# Patient Record
Sex: Female | Born: 1940 | Race: White | Hispanic: No | Smoking: Never smoker
Health system: Southern US, Community
[De-identification: ages and names within clinical notes are randomized; demographics above are authoritative.]

## PROBLEM LIST (undated history)

## (undated) DIAGNOSIS — I776 Arteritis, unspecified: Secondary | ICD-10-CM

## (undated) DIAGNOSIS — I77810 Thoracic aortic ectasia: Secondary | ICD-10-CM

## (undated) DIAGNOSIS — E785 Hyperlipidemia, unspecified: Secondary | ICD-10-CM

## (undated) DIAGNOSIS — I4819 Other persistent atrial fibrillation: Secondary | ICD-10-CM

## (undated) DIAGNOSIS — I10 Essential (primary) hypertension: Secondary | ICD-10-CM

## (undated) DIAGNOSIS — I5032 Chronic diastolic (congestive) heart failure: Secondary | ICD-10-CM

## (undated) DIAGNOSIS — M3130 Wegener's granulomatosis without renal involvement: Secondary | ICD-10-CM

## (undated) DIAGNOSIS — E039 Hypothyroidism, unspecified: Secondary | ICD-10-CM

## (undated) DIAGNOSIS — I639 Cerebral infarction, unspecified: Secondary | ICD-10-CM

## (undated) HISTORY — PX: NASAL SINUS SURGERY: SHX719

## (undated) HISTORY — DX: Cerebral infarction, unspecified: I63.9

## (undated) HISTORY — DX: Thoracic aortic ectasia: I77.810

## (undated) HISTORY — DX: Arteritis, unspecified: I77.6

## (undated) HISTORY — PX: LUNG SURGERY: SHX703

## (undated) HISTORY — DX: Essential (primary) hypertension: I10

## (undated) HISTORY — DX: Chronic diastolic (congestive) heart failure: I50.32

## (undated) HISTORY — DX: Other persistent atrial fibrillation: I48.19

## (undated) HISTORY — DX: Hyperlipidemia, unspecified: E78.5

## (undated) HISTORY — DX: Wegener's granulomatosis without renal involvement: M31.30

## (undated) HISTORY — DX: Hypothyroidism, unspecified: E03.9

## (undated) HISTORY — PX: BUNIONECTOMY: SHX129

## (undated) HISTORY — PX: CHOLECYSTECTOMY: SHX55

---

## 1977-11-19 HISTORY — PX: ABDOMINAL HYSTERECTOMY: SHX81

## 1998-10-07 ENCOUNTER — Other Ambulatory Visit: Admission: RE | Admit: 1998-10-07 | Discharge: 1998-10-07 | Payer: Self-pay | Admitting: Obstetrics and Gynecology

## 1999-01-10 ENCOUNTER — Encounter: Payer: Self-pay | Admitting: Neurosurgery

## 1999-01-10 ENCOUNTER — Ambulatory Visit (HOSPITAL_COMMUNITY): Admission: RE | Admit: 1999-01-10 | Discharge: 1999-01-10 | Payer: Self-pay | Admitting: Neurosurgery

## 1999-01-19 ENCOUNTER — Encounter: Payer: Self-pay | Admitting: Neurosurgery

## 1999-01-19 ENCOUNTER — Ambulatory Visit (HOSPITAL_COMMUNITY): Admission: RE | Admit: 1999-01-19 | Discharge: 1999-01-19 | Payer: Self-pay | Admitting: Neurosurgery

## 1999-01-20 ENCOUNTER — Encounter: Payer: Self-pay | Admitting: Neurosurgery

## 1999-01-20 ENCOUNTER — Ambulatory Visit (HOSPITAL_COMMUNITY): Admission: RE | Admit: 1999-01-20 | Discharge: 1999-01-20 | Payer: Self-pay | Admitting: Neurosurgery

## 1999-01-25 ENCOUNTER — Encounter: Payer: Self-pay | Admitting: Thoracic Surgery

## 1999-01-26 ENCOUNTER — Encounter: Payer: Self-pay | Admitting: Thoracic Surgery

## 1999-01-26 ENCOUNTER — Inpatient Hospital Stay (HOSPITAL_COMMUNITY): Admission: RE | Admit: 1999-01-26 | Discharge: 1999-01-31 | Payer: Self-pay | Admitting: Thoracic Surgery

## 1999-01-27 ENCOUNTER — Encounter: Payer: Self-pay | Admitting: Thoracic Surgery

## 1999-01-28 ENCOUNTER — Encounter: Payer: Self-pay | Admitting: Thoracic Surgery

## 1999-01-29 ENCOUNTER — Encounter: Payer: Self-pay | Admitting: Thoracic Surgery

## 1999-08-30 ENCOUNTER — Encounter: Admission: RE | Admit: 1999-08-30 | Discharge: 1999-08-30 | Payer: Self-pay | Admitting: Thoracic Surgery

## 1999-09-04 ENCOUNTER — Encounter: Admission: RE | Admit: 1999-09-04 | Discharge: 1999-09-04 | Payer: Self-pay | Admitting: Thoracic Surgery

## 1999-09-07 ENCOUNTER — Encounter (INDEPENDENT_AMBULATORY_CARE_PROVIDER_SITE_OTHER): Payer: Self-pay | Admitting: Specialist

## 1999-09-07 ENCOUNTER — Ambulatory Visit (HOSPITAL_COMMUNITY): Admission: RE | Admit: 1999-09-07 | Discharge: 1999-09-07 | Payer: Self-pay | Admitting: Critical Care Medicine

## 1999-09-07 ENCOUNTER — Encounter: Payer: Self-pay | Admitting: Critical Care Medicine

## 1999-09-21 ENCOUNTER — Ambulatory Visit: Admission: RE | Admit: 1999-09-21 | Discharge: 1999-09-21 | Payer: Self-pay | Admitting: Critical Care Medicine

## 1999-10-23 ENCOUNTER — Other Ambulatory Visit: Admission: RE | Admit: 1999-10-23 | Discharge: 1999-10-23 | Payer: Self-pay | Admitting: Obstetrics and Gynecology

## 1999-11-14 ENCOUNTER — Encounter: Admission: RE | Admit: 1999-11-14 | Discharge: 1999-11-14 | Payer: Self-pay | Admitting: Obstetrics and Gynecology

## 1999-11-14 ENCOUNTER — Encounter: Payer: Self-pay | Admitting: Obstetrics and Gynecology

## 2000-09-26 ENCOUNTER — Encounter: Payer: Self-pay | Admitting: Obstetrics and Gynecology

## 2000-09-26 ENCOUNTER — Encounter: Admission: RE | Admit: 2000-09-26 | Discharge: 2000-09-26 | Payer: Self-pay | Admitting: Obstetrics and Gynecology

## 2000-10-24 ENCOUNTER — Other Ambulatory Visit: Admission: RE | Admit: 2000-10-24 | Discharge: 2000-10-24 | Payer: Self-pay | Admitting: Obstetrics and Gynecology

## 2001-10-02 ENCOUNTER — Encounter: Payer: Self-pay | Admitting: Obstetrics and Gynecology

## 2001-10-02 ENCOUNTER — Encounter: Admission: RE | Admit: 2001-10-02 | Discharge: 2001-10-02 | Payer: Self-pay | Admitting: Obstetrics and Gynecology

## 2001-12-30 ENCOUNTER — Other Ambulatory Visit: Admission: RE | Admit: 2001-12-30 | Discharge: 2001-12-30 | Payer: Self-pay | Admitting: Obstetrics and Gynecology

## 2002-10-26 ENCOUNTER — Encounter: Payer: Self-pay | Admitting: Obstetrics and Gynecology

## 2002-10-26 ENCOUNTER — Encounter: Admission: RE | Admit: 2002-10-26 | Discharge: 2002-10-26 | Payer: Self-pay | Admitting: Obstetrics and Gynecology

## 2002-11-02 ENCOUNTER — Encounter (INDEPENDENT_AMBULATORY_CARE_PROVIDER_SITE_OTHER): Payer: Self-pay | Admitting: *Deleted

## 2002-11-02 ENCOUNTER — Ambulatory Visit (HOSPITAL_COMMUNITY): Admission: RE | Admit: 2002-11-02 | Discharge: 2002-11-02 | Payer: Self-pay | Admitting: Gastroenterology

## 2003-09-27 ENCOUNTER — Encounter: Admission: RE | Admit: 2003-09-27 | Discharge: 2003-10-19 | Payer: Self-pay | Admitting: Internal Medicine

## 2003-11-08 ENCOUNTER — Encounter: Admission: RE | Admit: 2003-11-08 | Discharge: 2003-11-08 | Payer: Self-pay | Admitting: Obstetrics and Gynecology

## 2004-12-08 ENCOUNTER — Encounter: Admission: RE | Admit: 2004-12-08 | Discharge: 2004-12-08 | Payer: Self-pay | Admitting: Obstetrics and Gynecology

## 2007-01-03 ENCOUNTER — Encounter: Admission: RE | Admit: 2007-01-03 | Discharge: 2007-01-03 | Payer: Self-pay | Admitting: Family Medicine

## 2007-10-27 ENCOUNTER — Encounter: Admission: RE | Admit: 2007-10-27 | Discharge: 2007-10-27 | Payer: Self-pay | Admitting: Family Medicine

## 2007-11-20 ENCOUNTER — Emergency Department (HOSPITAL_COMMUNITY): Admission: EM | Admit: 2007-11-20 | Discharge: 2007-11-20 | Payer: Self-pay | Admitting: Emergency Medicine

## 2009-03-30 ENCOUNTER — Encounter: Admission: RE | Admit: 2009-03-30 | Discharge: 2009-03-30 | Payer: Self-pay | Admitting: Family Medicine

## 2010-04-03 ENCOUNTER — Encounter: Admission: RE | Admit: 2010-04-03 | Discharge: 2010-04-03 | Payer: Self-pay | Admitting: Family Medicine

## 2010-12-10 ENCOUNTER — Encounter: Payer: Self-pay | Admitting: Family Medicine

## 2011-01-26 ENCOUNTER — Other Ambulatory Visit: Payer: Self-pay | Admitting: Dermatology

## 2011-04-06 NOTE — Op Note (Signed)
   NAME:  Carolyn, Vazquez                        ACCOUNT NO.:  [id number]   MEDICAL RECORD NO.:  [id number]                   PATIENT TYPE:  AMB   LOCATION:  ENDO                                 FACILITY:  [facility]   PHYSICIAN:  Petra Kuba, M.D.                 DATE OF BIRTH:  December 06, 1940   DATE OF PROCEDURE:  DATE OF DISCHARGE:                                 OPERATIVE REPORT   PROCEDURE:  Colonoscopy with biopsy.   INDICATIONS:  Family history of colon cancer, due for repeat screening.   Consent was signed after risks, benefits, methods and options were  thoroughly discussed in the office.   MEDICINES USED:  Demerol 100, Versed 5.   PROCEDURE:  Rectal inspection is pertinent for external hemorrhoids, small  digital exam was negative.  The video pediatric adjustable colonoscope was  inserted with minimal difficulty which required left-side pressure was able  to be advanced to the cecum.  Other than some left-sided diverticula, no  other abnormalities were seen on insertion.  Cecum was identified by the  appendiceal orifice and the ileocecal valve.  The scope was slowly  withdrawn.  The prep was adequate.  There was some liquid stool that  required washing and suctioning.  On slow withdrawal through the colon,  cecum, ascending and transverse were normal.  There were some left-sided  diverticular seen and in the distal sigmoid a tiny probable hyperplastic  appearing polyp was seen which was cold biopsied times two.  The scope was  slowly withdrawn back to the rectum and retroflexed, pertinent for some  internal hemorrhoids.  The scope was straightened, re-advanced a good ways  up the left side of the colon.  Air was suctioned, the scope was removed.  The patient tolerated the procedure well.  There was no obvious immediate  complication.   ENDOSCOPIC DIAGNOSES:  1. Internal and external hemorrhoids.  2. Left-sided diverticula.  3. Tiny sigmoid polyp cold biopsied.  4.  Otherwise, within normal limits to the cecum.    PLAN:  Yearly rectals and guaiacs per Dr. Julian Reil.  Happy to see back  p.r.n.; otherwise, await pathology.  Probably will re-check colon screening  in five years if doing well medically.                                               Petra Kuba, M.D.    MEM/MEDQ  D:  11/02/2002  T:  11/02/2002  Job:  [id number]   cc:   Schuyler Amor, M.D.  [address]  [place], [place] [address]  Fax: [phone]   Dr. Leo Grosser

## 2011-05-08 ENCOUNTER — Other Ambulatory Visit: Payer: Self-pay | Admitting: Family Medicine

## 2011-05-08 DIAGNOSIS — Z1231 Encounter for screening mammogram for malignant neoplasm of breast: Secondary | ICD-10-CM

## 2011-07-10 ENCOUNTER — Other Ambulatory Visit: Payer: Self-pay | Admitting: Dermatology

## 2011-07-31 ENCOUNTER — Ambulatory Visit
Admission: RE | Admit: 2011-07-31 | Discharge: 2011-07-31 | Disposition: A | Discharge status: Home / Self Care | Payer: Medicare Other | Source: Ambulatory Visit | Attending: Family Medicine | Admitting: Family Medicine

## 2011-07-31 DIAGNOSIS — Z1231 Encounter for screening mammogram for malignant neoplasm of breast: Secondary | ICD-10-CM

## 2011-08-09 LAB — URINE MICROSCOPIC-ADD ON

## 2011-08-09 LAB — I-STAT 8, (EC8 V) (CONVERTED LAB)
Acid-Base Excess: 3 — ABNORMAL HIGH
BUN: 7
Bicarbonate: 26 — ABNORMAL HIGH
Chloride: 108
Glucose, Bld: 117 — ABNORMAL HIGH
HCT: 50 — ABNORMAL HIGH
Operator id: 285491
Sodium: 138
TCO2: 27
pH, Ven: 7.495 — ABNORMAL HIGH

## 2011-08-09 LAB — DIFFERENTIAL
Eosinophils Absolute: 0.1
Eosinophils Relative: 1
Lymphocytes Relative: 10 — ABNORMAL LOW
Monocytes Absolute: 1.6 — ABNORMAL HIGH
Monocytes Relative: 8
Neutro Abs: 15.7 — ABNORMAL HIGH
Neutrophils Relative %: 81 — ABNORMAL HIGH

## 2011-08-09 LAB — POCT I-STAT CREATININE: Operator id: 285491

## 2011-08-09 LAB — URINALYSIS, ROUTINE W REFLEX MICROSCOPIC
Protein, ur: 100 — AB
Specific Gravity, Urine: 1.022
pH: 7

## 2011-08-09 LAB — CBC
HCT: 45.8
MCHC: 34.1
Platelets: 429 — ABNORMAL HIGH
RBC: 5.04
WBC: 19.4 — ABNORMAL HIGH

## 2012-05-07 ENCOUNTER — Other Ambulatory Visit: Payer: Self-pay | Admitting: Gastroenterology

## 2012-07-16 ENCOUNTER — Other Ambulatory Visit: Payer: Self-pay | Admitting: Family Medicine

## 2012-07-16 DIAGNOSIS — Z1231 Encounter for screening mammogram for malignant neoplasm of breast: Secondary | ICD-10-CM

## 2012-07-30 ENCOUNTER — Other Ambulatory Visit: Payer: Self-pay | Admitting: Dermatology

## 2012-08-15 ENCOUNTER — Ambulatory Visit
Admission: RE | Admit: 2012-08-15 | Discharge: 2012-08-15 | Disposition: A | Discharge status: Home / Self Care | Payer: Medicare Other | Source: Ambulatory Visit | Attending: Family Medicine | Admitting: Family Medicine

## 2012-08-15 DIAGNOSIS — Z1231 Encounter for screening mammogram for malignant neoplasm of breast: Secondary | ICD-10-CM

## 2012-11-21 ENCOUNTER — Other Ambulatory Visit: Payer: Self-pay | Admitting: Dermatology

## 2013-01-12 ENCOUNTER — Other Ambulatory Visit: Payer: Self-pay | Admitting: Family Medicine

## 2013-01-12 DIAGNOSIS — M542 Cervicalgia: Secondary | ICD-10-CM

## 2013-01-18 ENCOUNTER — Ambulatory Visit
Admission: RE | Admit: 2013-01-18 | Discharge: 2013-01-18 | Disposition: A | Discharge status: Home / Self Care | Payer: Medicare Other | Source: Ambulatory Visit | Attending: Family Medicine | Admitting: Family Medicine

## 2013-01-18 DIAGNOSIS — M542 Cervicalgia: Secondary | ICD-10-CM

## 2013-06-11 DIAGNOSIS — J309 Allergic rhinitis, unspecified: Secondary | ICD-10-CM | POA: Insufficient documentation

## 2013-06-11 DIAGNOSIS — I776 Arteritis, unspecified: Secondary | ICD-10-CM | POA: Insufficient documentation

## 2014-06-07 ENCOUNTER — Ambulatory Visit
Admission: RE | Admit: 2014-06-07 | Discharge: 2014-06-07 | Disposition: A | Discharge status: Home / Self Care | Payer: Medicare Other | Source: Ambulatory Visit | Attending: Family Medicine | Admitting: Family Medicine

## 2014-06-07 ENCOUNTER — Other Ambulatory Visit: Payer: Self-pay | Admitting: Family Medicine

## 2014-06-07 DIAGNOSIS — M3130 Wegener's granulomatosis without renal involvement: Secondary | ICD-10-CM

## 2014-08-12 DIAGNOSIS — J329 Chronic sinusitis, unspecified: Secondary | ICD-10-CM | POA: Insufficient documentation

## 2014-09-15 ENCOUNTER — Other Ambulatory Visit: Payer: Self-pay

## 2014-09-15 DIAGNOSIS — Z1231 Encounter for screening mammogram for malignant neoplasm of breast: Secondary | ICD-10-CM

## 2014-10-12 ENCOUNTER — Ambulatory Visit
Admission: RE | Admit: 2014-10-12 | Discharge: 2014-10-12 | Disposition: A | Discharge status: Home / Self Care | Payer: Medicare Other | Source: Ambulatory Visit

## 2014-10-12 DIAGNOSIS — Z1231 Encounter for screening mammogram for malignant neoplasm of breast: Secondary | ICD-10-CM

## 2014-12-15 ENCOUNTER — Ambulatory Visit (INDEPENDENT_AMBULATORY_CARE_PROVIDER_SITE_OTHER): Payer: Medicare Other | Admitting: Cardiology

## 2014-12-15 ENCOUNTER — Encounter: Payer: Self-pay | Admitting: Cardiology

## 2014-12-15 VITALS — BP 140/100 | HR 89 | Ht 61.5 in | Wt 156.4 lb

## 2014-12-15 DIAGNOSIS — R0600 Dyspnea, unspecified: Secondary | ICD-10-CM | POA: Insufficient documentation

## 2014-12-15 DIAGNOSIS — M3130 Wegener's granulomatosis without renal involvement: Secondary | ICD-10-CM | POA: Insufficient documentation

## 2014-12-15 DIAGNOSIS — R0789 Other chest pain: Secondary | ICD-10-CM | POA: Insufficient documentation

## 2014-12-15 DIAGNOSIS — R0602 Shortness of breath: Secondary | ICD-10-CM

## 2014-12-15 DIAGNOSIS — R0609 Other forms of dyspnea: Secondary | ICD-10-CM

## 2014-12-15 DIAGNOSIS — I776 Arteritis, unspecified: Secondary | ICD-10-CM

## 2014-12-15 NOTE — Progress Notes (Signed)
Cardiology Office Note   Date:  12/15/2014   ID:  Carolyn, Vazquez March 03, 1941, MRN [id number]  PCP:  Lilian Coma, MD  Cardiologist:   Darlin Coco, MD   No chief complaint on file.     History of Present Illness: Carolyn Vazquez is a 74 y.o. female who presents for evaluation of chest tightness and exertional dyspnea.  She is a medical patient of Dr. Stephanie Acre.  She does not have any known cardiac disease.  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  It initially presented as an abnormal chest x-ray and she underwent biopsy which yielded the diagnosis of Wegener's granulomatosis.  Since 2000 she has been followed by the pulmonary department at [facility].  In 2003 she had some left upper chest discomfort and had another biopsy which again revealed Wegener's ranula mitosis.  She has been treated over the years with immunosuppressive therapy.  At the present time she is not on any active therapy.  Last spring the patient had a terrible cough and was treated by Dr. Cheron Schaumann with antibiotics and was also seen at [facility].  She gradually improved.  Her most recent chest x-ray of 06/07/2014 showed no active pulmonary disease and showed hyperinflation of the lungs.  Heart size was normal. Recently the patient has been experiencing some chest heaviness off and early in the morning.  She has noted exertional dyspnea climbing stairs and walking uphill.  She has had a past history of peripheral edema which resolves after she was placed on losartan HCT by her physicians at [facility].  She reports that in October 2015 she had pulmonary function studies at [facility] and subsequently was given a trial of steroid inhaler for several weeks but she did not notice any improvement in her symptoms and therefore stopped the inhaler.  Her last nuclear stress test was in 2008 at [place] History    Diagnosis Date  . Thyroid disease   . Hyperlipidemia   . Chronic kidney disease     Past Surgical History  Procedure Laterality Date  . Lung surgery  2000's    both vasculitis  . Abdominal hysterectomy  1979  . Nasal sinus surgery    . Cholecystectomy       Current Outpatient Prescriptions  Medication Sig Dispense Refill  . acetaminophen (TYLENOL) 325 MG tablet Take 325 mg by mouth daily as needed. Mild pain    . aspirin 81 MG tablet Take 81 mg by mouth daily.    . B Complex Vitamins (B-COMPLEX/B-12) TABS Take by mouth daily.    . Cholecalciferol (VITAMIN D3) 1000 UNITS CAPS Take 2,000 mg by mouth daily.    . fexofenadine (ALLEGRA) 180 MG tablet Take 180 mg by mouth daily.    [name] levothyroxine (SYNTHROID, LEVOTHROID) 88 MCG tablet Take 88 mcg by mouth daily.    [name] losartan-hydrochlorothiazide (HYZAAR) 50-12.5 MG per tablet Take by mouth daily.    . Naproxen Sodium 220 MG CAPS Take 220 mg by mouth daily as needed. headache     No current facility-administered medications for this visit.    Allergies:   Codeine; Penicillins; and Simvastatin    Social History:  The patient  reports that she has never smoked. She does not have any smokeless tobacco history on file. She reports that she drinks alcohol. She reports that she does not use illicit drugs.  Family History:  The patient's family history includes Alcoholism in her father; Asthma in her brother and sister; Diabetes in her brother; Heart attack in her brother; Stroke in her father and mother; Thyroid disease in her mother, sister, and sister.    ROS:  Please see the history of present illness.   Otherwise, review of systems are positive for mild essential tremor.   All other systems are reviewed and negative.    PHYSICAL EXAM: VS:  BP 140/100 mmHg  Pulse 89  Ht 5' 1.5" (1.562 m)  Wt 156 lb 6.4 oz (70.943 kg)  BMI 29.08 kg/m2 , BMI Body mass index is 29.08 kg/(m^2). GEN: Well nourished, well developed, in no acute  distress HEENT: normal Neck: no JVD, carotid bruits, or masses Cardiac: RRR; no murmurs, rubs, or gallops,no edema  Respiratory:  clear to auscultation bilaterally, normal work of breathing GI: soft, nontender, nondistended, + BS MS: no deformity or atrophy Skin: warm and dry, no rash Neuro:  Strength and sensation are intact Psych: euthymic mood, full affect   EKG:  EKG is ordered today. The ekg ordered today demonstrates normal sinus rhythm with peaked P waves in 2,3 and aVF suggesting right atrial enlargement.  No ischemic changes at rest.   Recent Labs: No results found for requested labs within last 365 days.    Lipid Panel No results found for: CHOL, TRIG, HDL, CHOLHDL, VLDL, LDLCALC, LDLDIRECT    Wt Readings from Last 3 Encounters:  12/15/14 156 lb 6.4 oz (70.943 kg)      Other studies Reviewed: Additional studies/ records that were reviewed today include:     ASSESSMENT AND PLAN:  1.  Dyspnea on exertion 2.  Chest pressure and heaviness 3.  Essential hypertension 4.  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  The patient does not have concerns regarding medicines.  The following changes have been made:  no change  Labs/ tests ordered today include:   Orders Placed This Encounter  Procedures  . Myocardial Perfusion Imaging  . EKG 12-Lead  . 2D Echocardiogram without contrast     Disposition:  We will have the patient return for a 2-D echocardiogram to evaluate her left ventricular and right ventricular function and pulmonary artery pressure.  Her EKG suggests possible elevated right-sided pressures.  We will also have her return for a walking Lexiscan Myoview stress test to evaluate her complaint of chest heaviness. No new medications were prescribed today. Many thanks for the opportunity to see this pleasant woman and we will be in touch  regarding the results of her studies   Signed, Darlin Coco, MD  12/15/2014 5:13 PM    [facility] [place], [place], [place]  [address] Phone: [phone]; Fax: [phone]

## 2014-12-15 NOTE — Patient Instructions (Addendum)
Your physician has requested that you have an echocardiogram. Echocardiography is a painless test that uses sound waves to create images of your heart. It provides your doctor with information about the size and shape of your heart and how well your heart's chambers and valves are working. This procedure takes approximately one hour. There are no restrictions for this procedure.  Your physician has requested that you have a lexiscan myoview. For further information please visit [email or web address]. Please follow instruction sheet, as given.  Your physician recommends that you continue on your current medications as directed. Please refer to the Current Medication list given to you today.  Follow up as needed

## 2014-12-24 ENCOUNTER — Ambulatory Visit (HOSPITAL_COMMUNITY): Payer: Medicare Other | Attending: Cardiology

## 2014-12-24 DIAGNOSIS — R0602 Shortness of breath: Secondary | ICD-10-CM | POA: Insufficient documentation

## 2014-12-24 DIAGNOSIS — R0789 Other chest pain: Secondary | ICD-10-CM

## 2014-12-24 DIAGNOSIS — A5002 Early congenital syphilitic osteochondropathy: Secondary | ICD-10-CM | POA: Diagnosis not present

## 2014-12-24 NOTE — Progress Notes (Signed)
2D Echo completed. 12/24/2014

## 2014-12-30 ENCOUNTER — Ambulatory Visit (HOSPITAL_COMMUNITY): Payer: Medicare Other | Attending: Cardiology | Admitting: Radiology

## 2014-12-30 DIAGNOSIS — R0789 Other chest pain: Secondary | ICD-10-CM | POA: Diagnosis not present

## 2014-12-30 DIAGNOSIS — R0602 Shortness of breath: Secondary | ICD-10-CM | POA: Diagnosis not present

## 2014-12-30 MED ORDER — REGADENOSON 0.4 MG/5ML IV SOLN
0.4000 mg | Freq: Once | INTRAVENOUS | Status: AC
  Administered 2014-12-30: 0.4 mg via INTRAVENOUS

## 2014-12-30 MED ORDER — TECHNETIUM TC 99M SESTAMIBI GENERIC - CARDIOLITE
11.0000 | Freq: Once | INTRAVENOUS | Status: AC | PRN
  Administered 2014-12-30: 11 via INTRAVENOUS

## 2014-12-30 MED ORDER — TECHNETIUM TC 99M SESTAMIBI GENERIC - CARDIOLITE
33.0000 | Freq: Once | INTRAVENOUS | Status: AC | PRN
  Administered 2014-12-30: 33 via INTRAVENOUS

## 2014-12-30 NOTE — Progress Notes (Signed)
[name] [address] [place], [place] [address] [phone]    Cardiology Nuclear Med Study  Carolyn Vazquez is a 74 y.o. female     MRN : [id number]     DOB: [birth year]  Procedure Date: 12/30/2014  Nuclear Med Background Indication for Stress Test:  Evaluation for Ischemia History:  MPI 2008 (normal)  Cardiac Risk Factors: Hypertension  Symptoms:  Chest Pain and DOE   Nuclear Pre-Procedure Caffeine/Decaff Intake:  None NPO After: 9:00pm   Lungs:  clear O2 Sat: 97% on room air. IV 0.9% NS with Angio Cath:  22g  IV Site: R Forearm  IV Started by:  Crissie Figures, RN  Chest Size (in):  36 Cup Size: C  Height: 5' 1.5" (1.562 m)  Weight:  152 lb (68.947 kg)  BMI:  Body mass index is 28.26 kg/(m^2). Tech Comments:  N/A    Nuclear Med Study 1 or 2 day study: 1 day  Stress Test Type:  Lexiscan  Reading MD: N/A  Order Authorizing Provider:  Darlin Coco, MD  Resting Radionuclide: Technetium 41m Sestamibi  Resting Radionuclide Dose: 11.0 mCi   Stress Radionuclide:  Technetium 20m Sestamibi  Stress Radionuclide Dose: 33.0 mCi           Stress Protocol Rest HR: 69 Stress HR: 110  Rest BP: 143/85 Stress BP: 132/76  Exercise Time (min): n/a METS: n/a   Predicted Max HR: 146 bpm % Max HR: 75.34 bpm Rate Pressure Product: 15840   Dose of Adenosine (mg):  n/a Dose of Lexiscan: 0.4 mg  Dose of Atropine (mg): n/a Dose of Dobutamine: n/a mcg/kg/min (at max HR)  Stress Test Technologist: Glade Lloyd, BS-ES  Nuclear Technologist:  Earl Many, CNMT     Rest Procedure:  Myocardial perfusion imaging was performed at rest 45 minutes following the intravenous administration of Technetium 25m Sestamibi. Rest ECG: NSR with non-specific ST-T wave changes  Stress Procedure:  The patient received IV Lexiscan 0.4 mg over 15-seconds.  Technetium 45m Sestamibi injected at 30-seconds.  Quantitative spect images were obtained after a 45 minute delay.   During the infusion of Lexiscan the patient complained of SOB that resolved in recovery.  Stress ECG: No significant change from baseline ECG  QPS Raw Data Images:  Normal; no motion artifact; normal heart/lung ratio. Stress Images:  Normal homogeneous uptake in all areas of the myocardium. Rest Images:  Normal homogeneous uptake in all areas of the myocardium. Subtraction (SDS):  Normal Transient Ischemic Dilatation (Normal <1.22):  0.81 Lung/Heart Ratio (Normal <0.45):  0.27  Quantitative Gated Spect Images QGS EDV:  61 ml QGS ESV:  19 ml  Impression Exercise Capacity:  Lexiscan with no exercise. BP Response:  Normal blood pressure response. Clinical Symptoms:  There is dyspnea. ECG Impression:  No significant ST segment change suggestive of ischemia. Comparison with Prior Nuclear Study: No images to compare  Overall Impression:  Normal stress nuclear study.  LV Ejection Fraction: 68%.  LV Wall Motion:  NL LV Function; NL Wall Motion  [place]

## 2015-01-03 ENCOUNTER — Telehealth: Payer: Self-pay | Admitting: Cardiology

## 2015-01-03 ENCOUNTER — Telehealth: Payer: Self-pay | Admitting: *Deleted

## 2015-01-03 NOTE — Telephone Encounter (Signed)
-----   Message from Darlin Coco, MD sent at 12/27/2014  8:58 AM EST ----- Please report.  The echocardiogram shows good left ventricular systolic function.  There is diastolic dysfunction which may be contributing to her exertional dyspnea.  The right ventricular size is normal.  Await results of her nuclear stress test.Send a copy to Dr. Stephanie Acre.

## 2015-01-03 NOTE — Telephone Encounter (Signed)
Advised patient and scheduled follow up appointment with  Dr. Mare Ferrari for Wednesday morning

## 2015-01-03 NOTE — Telephone Encounter (Signed)
-----   Message from Darlin Coco, MD sent at 12/31/2014  9:18 AM EST ----- Please report.  The nuclear stress test was normal.  No evidence of ischemia.  The ejection fraction was normal at 68%.  Please send results to PCP

## 2015-01-03 NOTE — Telephone Encounter (Signed)
New message ° ° ° ° ° °Calling to get stress test results °

## 2015-01-05 ENCOUNTER — Encounter: Payer: Self-pay | Admitting: Cardiology

## 2015-01-05 ENCOUNTER — Ambulatory Visit (INDEPENDENT_AMBULATORY_CARE_PROVIDER_SITE_OTHER): Payer: Medicare Other | Admitting: Cardiology

## 2015-01-05 VITALS — BP 110/82 | HR 74 | Ht 61.5 in | Wt 154.0 lb

## 2015-01-05 DIAGNOSIS — I776 Arteritis, unspecified: Secondary | ICD-10-CM

## 2015-01-05 DIAGNOSIS — R0609 Other forms of dyspnea: Secondary | ICD-10-CM

## 2015-01-05 DIAGNOSIS — R0600 Dyspnea, unspecified: Secondary | ICD-10-CM

## 2015-01-05 DIAGNOSIS — I5189 Other ill-defined heart diseases: Secondary | ICD-10-CM

## 2015-01-05 DIAGNOSIS — R0789 Other chest pain: Secondary | ICD-10-CM

## 2015-01-05 DIAGNOSIS — M3130 Wegener's granulomatosis without renal involvement: Secondary | ICD-10-CM

## 2015-01-05 DIAGNOSIS — I519 Heart disease, unspecified: Secondary | ICD-10-CM

## 2015-01-05 NOTE — Progress Notes (Signed)
Cardiology Office Note   Date:  01/05/2015   ID:  Carolyn, Vazquez December 15, 1940, MRN [id number]  PCP:  Lilian Coma, MD  Cardiologist:   Darlin Coco, MD   No chief complaint on file.     History of Present Illness: Carolyn Vazquez is a 74 y.o. female who presents for follow-up office visit.  Carolyn Vazquez is a 74 y.o. female who presents for follow-up appointment of chest tightness and exertional dyspnea. She is a medical patient of Dr. Stephanie Acre. She does not have any known cardiac disease. 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 It initially presented as an abnormal chest x-ray and she underwent biopsy which yielded the diagnosis of Wegener's granulomatosis. Since 2000 she has been followed by the pulmonary department at [facility]. In 2003 she had some left upper chest discomfort and had another biopsy which again revealed Wegener's ranula mitosis. She has been treated over the years with immunosuppressive therapy. At the present time she is not on any active therapy. Last spring the patient had a terrible cough and was treated by Dr. Cheron Schaumann with antibiotics and was also seen at [facility]. She gradually improved. Her most recent chest x-ray of 06/07/2014 showed no active pulmonary disease and showed hyperinflation of the lungs. Heart size was normal. Recently the patient had been experiencing some chest heaviness off and early in the morning. She has noted exertional dyspnea climbing stairs and walking uphill. She has had a past history of peripheral edema which resolves after she was placed on losartan HCT by her physicians at [facility]. She reports that in October 2015 she had pulmonary function studies at [facility] and subsequently was given a trial of steroid inhaler for several weeks but she did not notice any improvement in her symptoms and therefore stopped the inhaler.  Her last nuclear stress test was in 2008 at [facility]. The patient had an echocardiogram on 12/24/14.The echocardiogram shows good left ventricular systolic function. There is diastolic dysfunction which may be contributing to her exertional dyspnea. The right ventricular size is normal. The patient had a Lexiscan Myoview stress test on 12/31/14. The nuclear stress test was normal. No evidence of ischemia. The ejection fraction was normal at 68%.  Past Medical History  Diagnosis Date  . Thyroid disease   . Hyperlipidemia   . Chronic kidney disease     Past Surgical History  Procedure Laterality Date  . Lung surgery  2000's    both vasculitis  . Abdominal hysterectomy  1979  . Nasal sinus surgery    . Cholecystectomy       Current Outpatient Prescriptions  Medication Sig Dispense Refill  . acetaminophen (TYLENOL) 325 MG tablet Take 325 mg by mouth daily as needed. Mild pain    . aspirin 81 MG tablet Take 81 mg by mouth daily.    . B Complex Vitamins (B-COMPLEX/B-12) TABS Take by mouth daily.    . Cholecalciferol (VITAMIN D3) 1000 UNITS CAPS Take 2,000 mg by mouth daily.    . fexofenadine (ALLEGRA) 180 MG tablet Take 180 mg by mouth daily.    [name] levothyroxine (SYNTHROID, LEVOTHROID) 88 MCG tablet Take 88 mcg by mouth daily.    [name] losartan-hydrochlorothiazide (HYZAAR) 50-12.5 MG per tablet Take by mouth daily.    . Naproxen Sodium 220 MG CAPS Take 220 mg by mouth daily as needed. headache    . Omeprazole Magnesium (  PRILOSEC OTC PO) Take 2 tablets by mouth daily.    . Probiotic Product (PROBIOTIC DAILY PO) Take 1 tablet by mouth daily.     No current facility-administered medications for this visit.    Allergies:   Codeine; Penicillins; and Simvastatin    Social History:  The patient  reports that she has never smoked. She does not have any smokeless tobacco history on file. She reports that she drinks alcohol. She reports that she does not use illicit drugs.   Family History:   The patient's family history includes Alcoholism in her father; Asthma in her brother and sister; Diabetes in her brother; Heart attack in her brother; Stroke in her father and mother; Thyroid disease in her mother, sister, and sister.    ROS:  Please see the history of present illness.   Otherwise, review of systems are positive for none.   All other systems are reviewed and negative.    PHYSICAL EXAM: VS:  BP 110/82 mmHg  Pulse 74  Ht 5' 1.5" (1.562 m)  Wt 154 lb (69.854 kg)  BMI 28.63 kg/m2 , BMI Body mass index is 28.63 kg/(m^2). GEN: Well nourished, well developed, in no acute distress HEENT: normal Neck: no JVD, carotid bruits, or masses Cardiac: RRR; no murmurs, rubs, or gallops,no edema  Respiratory:  clear to auscultation bilaterally, normal work of breathing GI: soft, nontender, nondistended, + BS MS: no deformity or atrophy Skin: warm and dry, no rash Neuro:  Strength and sensation are intact Psych: euthymic mood, full affect   EKG:  EKG is not ordered today.    Recent Labs: No results found for requested labs within last 365 days.    Lipid Panel No results found for: CHOL, TRIG, HDL, CHOLHDL, VLDL, LDLCALC, LDLDIRECT    Wt Readings from Last 3 Encounters:  01/05/15 154 lb (69.854 kg)  12/30/14 152 lb (68.947 kg)  12/15/14 156 lb 6.4 oz (70.943 kg)      Other studies Reviewed:    ASSESSMENT AND PLAN:  1. Dyspnea on exertion 2. Chest pressure and heaviness 3. Essential hypertension 4. 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  The patient does not have concerns regarding medicines. We discussed the results of her echocardiogram and her nuclear stress test today.  We discussed that she does have some diastolic dysfunction which may be contributing to her exertional dyspnea.  Considered adding a when necessary dose of furosemide on the  however her blood pressure is soft and she might not tolerate much additional diuresis.  She states that the mild peripheral edema present in her ankles at the end of the day really does not bother her much.  We did not add any new medication today.  Consider low-dose furosemide intermittently going forward if necessary.  Continue to watch dietary salt intake.  Recheck in 6 months for follow-up office visit.  The following changes have been made:  no change  Labs/ tests ordered today include: None  No orders of the defined types were placed in this encounter.     Disposition:   FU with Dr. Mare Ferrari in 6 months for follow-up office visit   Signed, Darlin Coco, MD  01/05/2015 10:17 AM    [facility] [place], [place], [place]  [address] Phone: [phone]; Fax: [phone]

## 2015-01-05 NOTE — Patient Instructions (Signed)
Your physician recommends that you continue on your current medications as directed. Please refer to the Current Medication list given to you today.  Your physician wants you to follow-up in: 6 month ov You will receive a reminder letter in the mail two months in advance. If you don't receive a letter, please call our office to schedule the follow-up appointment.  

## 2015-10-03 ENCOUNTER — Encounter: Payer: Self-pay | Admitting: Cardiology

## 2015-10-03 ENCOUNTER — Telehealth: Payer: Self-pay | Admitting: Cardiology

## 2015-10-03 NOTE — Telephone Encounter (Signed)
error 

## 2015-10-20 ENCOUNTER — Ambulatory Visit: Payer: Medicare Other | Admitting: Cardiology

## 2015-10-21 ENCOUNTER — Telehealth: Payer: Self-pay | Admitting: Cardiology

## 2015-10-21 NOTE — Telephone Encounter (Signed)
New Message  Pt calling to speak w/ RN concerning possiblility of a BP monitor. Please call back and discuss.

## 2015-10-21 NOTE — Telephone Encounter (Signed)
Spoke with patient and advised nothing received from PCP regarding an at home ambulatory blood pressure monitor  Per patient she had been having issues with her blood pressure and discussed with PCP  Requested that she call them Monday and ask them to resend, stated she would

## 2015-10-21 NOTE — Telephone Encounter (Signed)
New message   Pt calling to speak w/ RN concerning possiblility of a BP monitor. Please call back and discuss.

## 2015-10-21 NOTE — Telephone Encounter (Signed)
Left message to call back  

## 2015-10-31 ENCOUNTER — Encounter: Payer: Self-pay | Admitting: *Deleted

## 2015-10-31 ENCOUNTER — Ambulatory Visit (INDEPENDENT_AMBULATORY_CARE_PROVIDER_SITE_OTHER): Payer: Medicare Other

## 2015-10-31 DIAGNOSIS — I10 Essential (primary) hypertension: Secondary | ICD-10-CM | POA: Diagnosis not present

## 2015-10-31 NOTE — Progress Notes (Signed)
Patient ID: Carolyn Vazquez, female   DOB: [birth year], 74 y.o.   MRN: [id number] 24 hour ambulatory blood pressure monitor applied to patient.  Dr. Mare Ferrari to read. Fax results to ordering physician, Dr. [name], [facility] at [place], [place] # [phone].

## 2015-11-02 NOTE — Telephone Encounter (Signed)
Monitor results received pending  Dr. Sherryl Barters review

## 2015-11-03 NOTE — Telephone Encounter (Signed)
Put on Shelly's desk to send to referring MD

## 2015-11-09 ENCOUNTER — Telehealth: Payer: Self-pay | Admitting: Cardiology

## 2015-11-09 NOTE — Telephone Encounter (Signed)
New message      Pt is calling to get bp monitor results.  Please call and tell her something, she is very, very anxious.

## 2015-11-09 NOTE — Telephone Encounter (Signed)
I spoke with [name] and results are being faxed to primary care this morning.  I spoke with Carolyn Vazquez and gave her this information.

## 2015-12-15 ENCOUNTER — Ambulatory Visit (INDEPENDENT_AMBULATORY_CARE_PROVIDER_SITE_OTHER): Payer: Medicare Other | Admitting: Cardiology

## 2015-12-15 ENCOUNTER — Encounter: Payer: Self-pay | Admitting: Cardiology

## 2015-12-15 VITALS — BP 136/84 | HR 64 | Ht 61.5 in | Wt 150.1 lb

## 2015-12-15 DIAGNOSIS — I519 Heart disease, unspecified: Secondary | ICD-10-CM

## 2015-12-15 DIAGNOSIS — R0609 Other forms of dyspnea: Secondary | ICD-10-CM | POA: Diagnosis not present

## 2015-12-15 DIAGNOSIS — I5189 Other ill-defined heart diseases: Secondary | ICD-10-CM

## 2015-12-15 DIAGNOSIS — R0600 Dyspnea, unspecified: Secondary | ICD-10-CM

## 2015-12-15 NOTE — Patient Instructions (Addendum)
Medication Instructions:  Your physician recommends that you continue on your current medications as directed. Please refer to the Current Medication list given to you today.  Labwork: None ordered  Testing/Procedures: None ordered  Follow-Up: You have been referred to establish cardiology care with Dr. Meda Coffee in 6 months.  If you need a refill on your cardiac medications before your next appointment, please call your pharmacy.  Thank you for choosing [facility]!!

## 2015-12-15 NOTE — Progress Notes (Signed)
Cardiology Office Note   Date:  12/15/2015   ID:  Carolyn Vazquez, Carolyn Vazquez 06/08/1941, MRN [id number]  PCP:  Lilian Coma, MD  Cardiologist: Darlin Coco MD  Chief Complaint  Patient presents with  . Follow-up    essential hypertension.C.o of sob. Denies chest pain, le edema, claudication      History of Present Illness: Carolyn Vazquez is a 75 y.o. female who presents for a six-month follow-up visit   Carolyn Vazquez is a 75 y.o. female who presents for follow-up appointment of chest tightness and exertional dyspnea. She is a medical patient of Dr. Stephanie Acre. She does not have any known cardiac disease. 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 It initially presented as an abnormal chest x-ray and she underwent biopsy which yielded the diagnosis of Wegener's granulomatosis. Since 2000 she has been followed by the pulmonary department at [facility]. In 2003 she had some left upper chest discomfort and had another biopsy which again revealed Wegener's granulomatosis.. She has been treated over the years with immunosuppressive therapy. At the present time she is not on any active therapy. Last spring the patient had a terrible cough and was treated by Dr. Cheron Schaumann with antibiotics and was also seen at [facility]. She gradually improved. A recent chest x-ray of 06/07/2014 showed no active pulmonary disease and showed hyperinflation of the lungs. Heart size was normal. . She has had a past history of peripheral edema which resolves after she was placed on losartan HCT by her physicians at [facility]. She reports that in October 2015 she had pulmonary function studies at [facility] and subsequently was given a trial of steroid inhaler for several weeks but she did not notice any improvement in her symptoms and therefore stopped the inhaler. Her last nuclear stress test was in 2008 at [facility]. The patient had an echocardiogram on 12/24/14.The echocardiogram shows good left ventricular systolic function. There is diastolic dysfunction which may be contributing to her exertional dyspnea. The right ventricular size is normal. The patient had a Lexiscan Myoview stress test on 12/31/14. The nuclear stress test was normal. No evidence of ischemia. The ejection fraction was normal at 68%.  Since last visit she has been feeling better.  Her dyspnea has improved.  She has lost 4 pounds.  She now has a dog which she walks on a regular basis for exercise.  She has had no recent chest discomfort.  No dizziness or syncope.  No palpitations.  Past Medical History  Diagnosis Date  . Thyroid disease   . Hyperlipidemia   . Chronic kidney disease     Past Surgical History  Procedure Laterality Date  . Lung surgery  2000's    both vasculitis  . Abdominal hysterectomy  1979  . Nasal sinus surgery    . Cholecystectomy       Current Outpatient Prescriptions  Medication Sig Dispense Refill  . acetaminophen (TYLENOL) 325 MG tablet Take 325 mg by mouth daily as needed. Mild pain    . amLODipine (NORVASC) 5 MG tablet Take 5 mg by mouth every evening.    [name] aspirin 81 MG tablet Take 81 mg by mouth daily.    . B Complex Vitamins (B-COMPLEX/B-12) TABS Take 1 tablet by mouth daily.     . Cholecalciferol (VITAMIN D3) 1000 UNITS CAPS Take 2,000 mg by mouth daily.    . fexofenadine (ALLEGRA) 180 MG  tablet Take 180 mg by mouth daily.    [name] levothyroxine (SYNTHROID, LEVOTHROID) 88 MCG tablet Take 88 mcg by mouth daily.    [name] losartan (COZAAR) 100 MG tablet Take 100 mg by mouth every morning.    . Naproxen Sodium 220 MG CAPS Take 220 mg by mouth daily as needed. headache    . Omeprazole Magnesium (PRILOSEC OTC PO) Take 2 tablets by mouth daily.    . Probiotic Product (PROBIOTIC DAILY PO) Take 1 tablet by mouth daily.     No current facility-administered medications for this visit.    Allergies:    Codeine; Penicillins; and Simvastatin    Social History:  The patient  reports that she has never smoked. She does not have any smokeless tobacco history on file. She reports that she drinks alcohol. She reports that she does not use illicit drugs.   Family History:  The patient's family history includes Alcoholism in her father; Asthma in her brother and sister; Diabetes in her brother; Heart attack in her brother; Stroke in her father and mother; Thyroid disease in her mother, sister, and sister.    ROS:  Please see the history of present illness.   Otherwise, review of systems are positive for none.   All other systems are reviewed and negative.    PHYSICAL EXAM: VS:  BP 136/84 mmHg  Pulse 64  Ht 5' 1.5" (1.562 m)  Wt 150 lb 1.9 oz (68.094 kg)  BMI 27.91 kg/m2 , BMI Body mass index is 27.91 kg/(m^2). GEN: Well nourished, well developed, in no acute distress HEENT: normal Neck: no JVD, carotid bruits, or masses Cardiac: RRR; no murmurs, rubs, or gallops,no edema  Respiratory:  clear to auscultation bilaterally, normal work of breathing GI: soft, nontender, nondistended, + BS MS: no deformity or atrophy Skin: warm and dry, no rash Neuro:  Strength and sensation are intact Psych: euthymic mood, full affect   EKG:  EKG is ordered today. The ekg ordered today demonstrates normal sinus rhythm with short PR interval.   Recent Labs: No results found for requested labs within last 365 days.    Lipid Panel No results found for: CHOL, TRIG, HDL, CHOLHDL, VLDL, LDLCALC, LDLDIRECT    Wt Readings from Last 3 Encounters:  12/15/15 150 lb 1.9 oz (68.094 kg)  01/05/15 154 lb (69.854 kg)  12/30/14 152 lb (68.947 kg)         ASSESSMENT AND PLAN:  1. Dyspnea on exertion, improved 2. Chest pressure and heaviness, improved.  Stress test normal 3. Essential hypertension.  In December 2016 she wore a 24 hour blood pressure monitor which showed satisfactory blood pressure  control. 4. 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  The patient does not have concerns regarding medicines.  The following changes have been made:  no change  Labs/ tests ordered today include:   Orders Placed This Encounter  Procedures  . EKG 12-Lead     Disposition:   Continue same medication.  Recheck in 6 months by Dr. Meda Coffee  Signed, Darlin Coco MD 12/15/2015 1:20 PM    [address], [place], [place]  [address] Phone: [phone]; Fax: [phone]

## 2016-08-09 ENCOUNTER — Encounter: Payer: Self-pay | Admitting: Cardiology

## 2016-08-24 ENCOUNTER — Encounter: Payer: Self-pay | Admitting: Cardiology

## 2016-08-24 ENCOUNTER — Ambulatory Visit (INDEPENDENT_AMBULATORY_CARE_PROVIDER_SITE_OTHER): Payer: Medicare Other | Admitting: Cardiology

## 2016-08-24 VITALS — BP 120/64 | HR 67 | Ht 61.5 in | Wt 150.0 lb

## 2016-08-24 DIAGNOSIS — I10 Essential (primary) hypertension: Secondary | ICD-10-CM

## 2016-08-24 DIAGNOSIS — I5033 Acute on chronic diastolic (congestive) heart failure: Secondary | ICD-10-CM

## 2016-08-24 DIAGNOSIS — R0600 Dyspnea, unspecified: Secondary | ICD-10-CM

## 2016-08-24 DIAGNOSIS — M3130 Wegener's granulomatosis without renal involvement: Secondary | ICD-10-CM

## 2016-08-24 DIAGNOSIS — I776 Arteritis, unspecified: Secondary | ICD-10-CM

## 2016-08-24 DIAGNOSIS — R0609 Other forms of dyspnea: Secondary | ICD-10-CM | POA: Diagnosis not present

## 2016-08-24 MED ORDER — AMLODIPINE BESYLATE 2.5 MG PO TABS: 2.5000 mg | ORAL_TABLET | Freq: Every day | ORAL | 6 refills | Status: DC

## 2016-08-24 MED ORDER — FUROSEMIDE 20 MG PO TABS: 20.0000 mg | ORAL_TABLET | Freq: Every day | ORAL | 6 refills | Status: DC

## 2016-08-24 NOTE — Patient Instructions (Signed)
Medication Instructions:   DECREASE YOUR AMLODIPINE TO 2.5 MG ONCE DAILY  START TAKING LASIX 20 MG ONCE DAILY    Follow-Up:  Your physician wants you to follow-up in: [place] will receive a reminder letter in the mail two months in advance. If you don't receive a letter, please call our office to schedule the follow-up appointment.       If you need a refill on your cardiac medications before your next appointment, please call your pharmacy.

## 2016-08-24 NOTE — Progress Notes (Signed)
Cardiology Office Note    Date:  08/24/2016   ID:  Carolyn, Vazquez 1941/04/01, MRN [id number]  PCP:  Lilian Coma, MD  Cardiologist:   Ena Dawley, MD   Reason for the visit: 6 months follow up.  History of Present Illness:  Carolyn Vazquez is a 75 y.o. female was previously followed by Dr. Mare Ferrari, after she had an episode of chest tightness and exertional dyspnea. She is a medical patient of Dr. Stephanie Acre. She does not have any known cardiac disease. 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 It initially presented as an abnormal chest x-ray and she underwent biopsy which yielded the diagnosis of Wegener's granulomatosis. Since 2000 she has been followed by the pulmonary department at [facility]. In 2003 she had some left upper chest discomfort and had another biopsy which again revealed Wegener's granulomatosis.. She has been treated over the years with immunosuppressive therapy. At the present time she is not on any active therapy. Last spring the patient had a terrible cough and was treated by Dr. Cheron Schaumann with antibiotics and was also seen at [facility]. She gradually improved. A recent chest x-ray of 06/07/2014 showed no active pulmonary disease and showed hyperinflation of the lungs. Heart size was normal. She has had a past history of peripheral edema which resolves after she was placed on losartan HCT by her physicians at [facility]. She reports that in October 2015 she had pulmonary function studies at [facility] and subsequently was given a trial of steroid inhaler for several weeks but she did not notice any improvement in her symptoms and therefore stopped the inhaler. Her last nuclear stress test was in 2008 at [facility]. The patient had an echocardiogram on 12/24/14.The echocardiogram shows good left ventricular systolic function. There is grade 2 diastolic dysfunction which  may be contributing to her exertional dyspnea. The right ventricular size is normal. RV has normal function. The patient had a Lexiscan Myoview stress test on 12/31/14. The nuclear stress test was normal. No evidence of ischemia. The ejection fraction was normal at 68%.   08/24/2016 - the patient states that she has been doing okay, denies any chest pain, she continues to have stable dyspnea on exertion, however she continues to walk with her dog about 3/4 miles everyday. She feels most short of breath at the beginning of exercise but it eases up as she continues. She denies any orthopnea or proximal nocturnal dyspnea. She has dependent lower extremity edema towards the end of the day.   Past Medical History:  Diagnosis Date  . Chronic kidney disease   . Hyperlipidemia   . Thyroid disease    Past Surgical History:  Procedure Laterality Date  . ABDOMINAL HYSTERECTOMY  1979  . CHOLECYSTECTOMY    . 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 NASAL SINUS SURGERY     Current Medications: Outpatient Medications Prior to Visit  Medication Sig Dispense Refill  . acetaminophen (TYLENOL) 325 MG tablet Take 325 mg by mouth daily as needed. Mild pain    . aspirin 81 MG tablet Take 81 mg by mouth daily.    . B Complex Vitamins (B-COMPLEX/B-12) TABS Take 1 tablet by mouth daily.     . Cholecalciferol (VITAMIN D3) 1000 UNITS CAPS Take 2,000 mg by mouth daily.    . fexofenadine (ALLEGRA) 180 MG tablet Take 180 mg by mouth daily.    [name]  levothyroxine (SYNTHROID, LEVOTHROID) 88 MCG tablet Take 88 mcg by mouth daily.    [name] losartan (COZAAR) 100 MG tablet Take 100 mg by mouth every morning.    . Naproxen Sodium 220 MG CAPS Take 220 mg by mouth daily as needed. headache    . Probiotic Product (PROBIOTIC DAILY PO) Take 1 tablet by mouth daily.    [name] amLODipine (NORVASC) 5 MG tablet Take 5 mg by mouth every evening.    . Omeprazole Magnesium (PRILOSEC OTC PO) Take 2 tablets by mouth daily.     No  facility-administered medications prior to visit.      Allergies:   Codeine; Atorvastatin; Penicillins; and Simvastatin   Social History   Social History  . Marital status: Widowed    Spouse name: N/A  . Number of children: N/A  . Years of education: N/A   Social History Main Topics  . Smoking status: Never Smoker  . Smokeless tobacco: Never Used  . Alcohol use 0.0 oz/week     Comment: every day glass of wine  . Drug use: No  . Sexual activity: Not Asked   Other Topics Concern  . None   Social History Narrative  . None    Family History:  The patient's family history includes Alcoholism in her father; Asthma in her brother and sister; Diabetes in her brother; Heart attack in her brother; Stroke in her father and mother; Thyroid disease in her mother, sister, and sister.   ROS:   Please see the history of present illness.    ROS All other systems reviewed and are negative.   PHYSICAL EXAM:   VS:  BP 120/64   Pulse 67   Ht 5' 1.5" (1.562 m)   Wt 150 lb (68 kg)   BMI 27.88 kg/m    GEN: Well nourished, well developed, in no acute distress  HEENT: normal  Neck: no JVD, carotid bruits, or masses Cardiac: RRR; no murmurs, rubs, or gallops,no edema  Respiratory:  clear to auscultation bilaterally, normal work of breathing GI: soft, nontender, nondistended, + BS MS: no deformity or atrophy  Skin: warm and dry, no rash Neuro:  Alert and Oriented x 3, Strength and sensation are intact Psych: euthymic mood, full affect  Wt Readings from Last 3 Encounters:  08/24/16 150 lb (68 kg)  12/15/15 150 lb 1.9 oz (68.1 kg)  01/05/15 154 lb (69.9 kg)    Studies/Labs Reviewed:   EKG:  EKG is ordered today.  The ekg ordered today demonstrates Normal sinus rhythm with possible prior anterior MI, that is unchanged from Junior 26 2017.  Recent Labs: No results found for requested labs within last 8760 hours.   Lipid Panel No results found for: CHOL, TRIG, HDL, CHOLHDL, VLDL,  LDLCALC, LDLDIRECT  TTE: 12/2014  - Left ventricle: The cavity size was normal. Wall thickness was normal. Systolic function was normal. The estimated ejection fraction was in the range of 55% to 60%. Wall motion was normal; there were no regional wall motion abnormalities. Features are consistent with a pseudonormal left ventricular filling pattern, with concomitant abnormal relaxation and increased filling pressure (grade 2 diastolic dysfunction). - Aortic valve: There was no stenosis. - Aorta: Aortic root dimension: 36 mm (ED). - Aortic root: The aortic root was borderline dilated. - Mitral valve: Mildly calcified annulus. Mildly calcified leaflets . There was no significant regurgitation. - Left atrium: The atrium was moderately dilated. - Right ventricle: The cavity size was normal. Systolic function was normal. -  Pulmonary arteries: No complete TR doppler jet so unable to estimate PA systolic pressure. - Inferior vena cava: The vessel was normal in size. The respirophasic diameter changes were in the normal range (= 50%), consistent with normal central venous pressure.  Impressions: - Normal LV size and systolic function, EF 0000000. Moderate diastolic dysfunction. Normal RV size and systolic function. Moderate left atrial enlargement. No significant valvular abnormalities.   ASSESSMENT:    1. Hypertension, essential   2. Acute on chronic diastolic CHF (congestive heart failure), NYHA class 2 ([place])   3. Dyspnea on exertion   4. Wegener's granulomatosis with vasculitis ([place])   5. Essential hypertension     PLAN:  In order of problems listed above:  She is stable dyspnea on exertion however worsening lower extremity edema towards the end of today. I will decrease amlodipine dose from 5 mg to 2.5 mg daily. I will start Lasix 20 mg daily. She is supposed to follow with her primary care physician to next months he will check her labs and blood  pressure.   Medication Adjustments/Labs and Tests Ordered: Current medicines are reviewed at length with the patient today.  Concerns regarding medicines are outlined above.  Medication changes, Labs and Tests ordered today are listed in the Patient Instructions below. Patient Instructions  Medication Instructions:   DECREASE YOUR AMLODIPINE TO 2.5 MG ONCE DAILY  START TAKING LASIX 20 MG ONCE DAILY    Follow-Up:  Your physician wants you to follow-up in: [place] will receive a reminder letter in the mail two months in advance. If you don't receive a letter, please call our office to schedule the follow-up appointment.       If you need a refill on your cardiac medications before your next appointment, please call your pharmacy.      Signed, Ena Dawley, MD  08/24/2016 3:40 PM    [facility] [place], [place], [place]  [address] Phone: [phone]; Fax: [phone]

## 2016-09-19 ENCOUNTER — Encounter: Payer: Medicare Other | Admitting: Cardiology

## 2016-10-29 ENCOUNTER — Telehealth: Payer: Self-pay | Admitting: Cardiology

## 2016-10-29 NOTE — Telephone Encounter (Signed)
Left a message for the pt to call back.  

## 2016-10-29 NOTE — Telephone Encounter (Signed)
New message:   Please call,very concerned about the diagnosis that was on on a letter she received from her insurance company. It stated that she he had heart failure,she had never been told this before. Pt is very upset by this.

## 2016-10-30 ENCOUNTER — Telehealth: Payer: Self-pay | Admitting: Cardiology

## 2016-10-30 NOTE — Telephone Encounter (Signed)
New message ° °Pt is returning call  ° °Please call back °

## 2016-10-30 NOTE — Telephone Encounter (Signed)
She has grade 2 diastolic dysfunction and lower extremity edema that is presentation of heart failure in the setting of diastolic dysfunction.

## 2016-10-30 NOTE — Telephone Encounter (Signed)
Notified the pt that per Dr Meda Coffee she does have grade 2 diastolic dysfunction and lower extremity edema, which  is presentation of heart failure in the setting of diastolic dysfunction.  Per the pt, [employer] saw this diagnosis and they sent her a letter in the mail wanting her to enroll in their heart failure program.   Pt is very upset with this letter.  Informed the pt that she can call [facility] and opt to be taken off their list for Heart Failure program and follow-up.  Informed the pt that she has the right to do this, for this is a service they offer for free.   Advised the pt to call them in the near future, to avoid continuous letters being sent to her to enroll in this program.   Pt verbalized understanding and agrees with this plan.  Pt gracious for explaining her care more in detail.

## 2016-10-30 NOTE — Telephone Encounter (Signed)
Nuala Alpha, LPN at [id number] PM   Status: Sign at exiting of workspace    Notified the pt that per Dr Meda Coffee she does have grade 2 diastolic dysfunction and lower extremity edema, which  is presentation of heart failure in the setting of diastolic dysfunction.  Per the pt, [employer] saw this diagnosis and they sent her a letter in the mail wanting her to enroll in their heart failure program.   Pt is very upset with this letter.  Informed the pt that she can call [facility] and opt to be taken off their list for Heart Failure program and follow-up.  Informed the pt that she has the right to do this, for this is a service they offer for free.   Advised the pt to call them in the near future, to avoid continuous letters being sent to her to enroll in this program.   Pt verbalized understanding and agrees with this plan.  Pt gracious for explaining her care more in detail.    [name], MD at 10/30/2016 12:35 PM   Status: Signed    She has grade 2 diastolic dysfunction and lower extremity edema that is presentation of heart failure in the setting of diastolic dysfunction.    Nuala Alpha, LPN at [id number] AM   Status: Signed    Pt is calling in regards a diagnosis she was given by our office for CHF.  Pt states she received a letter from her insurance company about this, and states she was never informed that she had heart failure.  Informed the pt that she has a diagnosis of diastolic dysfunction, and provided pt education about this diagnosis.  Informed the pt that I will route this concern to Dr Meda Coffee to elaborate on, and follow-up with the pt thereafter.

## 2016-10-30 NOTE — Telephone Encounter (Signed)
Carolyn Alpha, LPN at [id number] [id number] PM   Status: Sign at exiting of workspace    Notified the pt that per Dr Meda Coffee she does have grade 2 diastolic dysfunction and lower extremity edema, which  is presentation of heart failure in the setting of diastolic dysfunction.  Per the pt, [employer] saw this diagnosis and they sent her a letter in the mail wanting her to enroll in their heart failure program.   Pt is very upset with this letter.  Informed the pt that she can call [facility] and opt to be taken off their list for Heart Failure program and follow-up.  Informed the pt that she has the right to do this, for this is a service they offer for free.   Advised the pt to call them in the near future, to avoid continuous letters being sent to her to enroll in this program.   Pt verbalized understanding and agrees with this plan.  Pt gracious for explaining her care more in detail.    [name], MD at 10/30/2016 12:35 PM   Status: Signed    She has grade 2 diastolic dysfunction and lower extremity edema that is presentation of heart failure in the setting of diastolic dysfunction.    Carolyn Alpha, LPN at [id number] [id number] AM   Status: Signed    Pt is calling in regards a diagnosis she was given by our office for CHF.  Pt states she received a letter from her insurance company about this, and states she was never informed that she had heart failure.  Informed the pt that she has a diagnosis of diastolic dysfunction, and provided pt education about this diagnosis.  Informed the pt that I will route this concern to Dr Meda Coffee to elaborate on, and follow-up with the pt thereafter.

## 2016-10-30 NOTE — Telephone Encounter (Signed)
New message  Pt is returning call  Please call back after 3 pm

## 2016-10-30 NOTE — Telephone Encounter (Signed)
Pt is calling in regards a diagnosis she was given by our office for CHF.  Pt states she received a letter from her insurance company about this, and states she was never informed that she had heart failure.  Informed the pt that she has a diagnosis of diastolic dysfunction, and provided pt education about this diagnosis.  Informed the pt that I will route this concern to Dr Meda Coffee to elaborate on, and follow-up with the pt thereafter.

## 2016-12-20 ENCOUNTER — Telehealth: Payer: Self-pay | Admitting: *Deleted

## 2016-12-20 MED ORDER — LEVOTHYROXINE SODIUM 100 MCG PO TABS: 100.0000 ug | ORAL_TABLET | Freq: Every day | ORAL | Status: DC

## 2016-12-20 NOTE — Telephone Encounter (Signed)
Spoke with the pt to inform her that per Dr Meda Coffee, she reviewed her scanned labs from her PCP and notes that she has Elevated TSH, and recommends that her Synthroid needs to be adjusted to 100 mcg po daily.  Also informed the pt that per Dr Meda Coffee, she also had elevated LDL, but normal previously, and she recommends that she improve her diet and we can recheck at the time of her appointment in March. Per the pt, her PCP at [facility] already adjusted her TSH to 100 mcg po daily, and is rechecking the pts lipids, TSH, and a CMET in 6 weeks.  Per the pt, she will have her PCP office fax the results to Dr Meda Coffee for her review, once results are complete.  Pt verbalized understanding and will plan on seeing Dr Meda Coffee for follow-up on 3/29.

## 2016-12-20 NOTE — Telephone Encounter (Signed)
-----   Message from [name], MD sent at 12/20/2016  4:59 PM EST ----- Elevated TSH, her Synthroid needs to be adjusted to 100 mcg, also elevated LDL, but normal previously, she should improve her diet and we can recheck at the time of her appointment in March.

## 2017-02-05 ENCOUNTER — Encounter: Payer: Self-pay | Admitting: Cardiology

## 2017-02-14 ENCOUNTER — Ambulatory Visit (INDEPENDENT_AMBULATORY_CARE_PROVIDER_SITE_OTHER): Payer: Medicare Other | Admitting: Cardiology

## 2017-02-14 ENCOUNTER — Encounter (INDEPENDENT_AMBULATORY_CARE_PROVIDER_SITE_OTHER): Payer: Self-pay

## 2017-02-14 VITALS — BP 120/74 | HR 75 | Ht 61.5 in | Wt 148.0 lb

## 2017-02-14 DIAGNOSIS — I10 Essential (primary) hypertension: Secondary | ICD-10-CM | POA: Diagnosis not present

## 2017-02-14 DIAGNOSIS — I5033 Acute on chronic diastolic (congestive) heart failure: Secondary | ICD-10-CM

## 2017-02-14 DIAGNOSIS — I519 Heart disease, unspecified: Secondary | ICD-10-CM

## 2017-02-14 DIAGNOSIS — I5189 Other ill-defined heart diseases: Secondary | ICD-10-CM

## 2017-02-14 NOTE — Progress Notes (Signed)
Cardiology Office Note    Date:  02/17/2017   ID:  Carolyn, Vazquez [birth year], MRN [id number]  PCP:  Lilian Coma, MD  Cardiologist:   Ena Dawley, MD   Reason for the visit: 6 months follow up.  History of Present Illness:  Carolyn Vazquez is a 76 y.o. female was previously followed by Dr. Mare Ferrari, after she had an episode of chest tightness and exertional dyspnea. She is a medical patient of Dr. Stephanie Acre. She does not have any known cardiac disease. 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 It initially presented as an abnormal chest x-ray and she underwent biopsy which yielded the diagnosis of Wegener's granulomatosis. Since 2000 she has been followed by the pulmonary department at [facility]. In 2003 she had some left upper chest discomfort and had another biopsy which again revealed Wegener's granulomatosis.. She has been treated over the years with immunosuppressive therapy. At the present time she is not on any active therapy. Last spring the patient had a terrible cough and was treated by Dr. Cheron Schaumann with antibiotics and was also seen at [facility]. She gradually improved. A recent chest x-ray of 06/07/2014 showed no active pulmonary disease and showed hyperinflation of the lungs. Heart size was normal. She has had a past history of peripheral edema which resolves after she was placed on losartan HCT by her physicians at [facility]. She reports that in October 2015 she had pulmonary function studies at [facility] and subsequently was given a trial of steroid inhaler for several weeks but she did not notice any improvement in her symptoms and therefore stopped the inhaler. Her last nuclear stress test was in 2008 at [facility]. The patient had an echocardiogram on 12/24/14.The echocardiogram shows good left ventricular systolic function. There is grade 2 diastolic dysfunction which  may be contributing to her exertional dyspnea. The right ventricular size is normal. RV has normal function. The patient had a Lexiscan Myoview stress test on 12/31/14. The nuclear stress test was normal. No evidence of ischemia. The ejection fraction was normal at 68%.   08/24/2016 - the patient states that she has been doing okay, denies any chest pain, she continues to have stable dyspnea on exertion, however she continues to walk with her dog about 3/4 miles everyday. She feels most short of breath at the beginning of exercise but it eases up as she continues. She denies any orthopnea or proximal nocturnal dyspnea. She has dependent lower extremity edema towards the end of the day.  02/14/17 - 6 months follow up the patient denies any chest pain, SOB, no LE edema, no orthopnea, PND. She has been compliant with her meds and has no side effects.   Past Medical History:  Diagnosis Date  . Chronic kidney disease   . Hyperlipidemia   . Thyroid disease    Past Surgical History:  Procedure Laterality Date  . ABDOMINAL HYSTERECTOMY  1979  . CHOLECYSTECTOMY    . 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 NASAL SINUS SURGERY     Current Medications: Outpatient Medications Prior to Visit  Medication Sig Dispense Refill  . acetaminophen (TYLENOL) 325 MG tablet Take 325 mg by mouth daily as needed. Mild pain    . amLODipine (NORVASC) 2.5 MG tablet Take 1 tablet (2.5 mg total) by mouth daily. 90 tablet 6  . aspirin 81 MG tablet Take 81 mg by mouth daily.    [name]  B Complex Vitamins (B-COMPLEX/B-12) TABS Take 1 tablet by mouth daily.     . Calcium Carbonate Antacid (TUMS PO) Take 1 tablet by mouth daily.    . Cholecalciferol (VITAMIN D3) 1000 UNITS CAPS Take 2,000 mg by mouth daily.    . fexofenadine (ALLEGRA) 180 MG tablet Take 180 mg by mouth daily.    . furosemide (LASIX) 20 MG tablet Take 1 tablet (20 mg total) by mouth daily. 90 tablet 6  . levothyroxine (SYNTHROID) 100 MCG tablet Take 1  tablet (100 mcg total) by mouth daily before breakfast.    . losartan (COZAAR) 100 MG tablet Take 100 mg by mouth every morning.    . Multiple Vitamins-Minerals (ICAPS) CAPS Take 1 tablet by mouth 2 (two) times daily.    . Naproxen Sodium 220 MG CAPS Take 220 mg by mouth daily as needed. headache    . Probiotic Product (PROBIOTIC DAILY PO) Take 1 tablet by mouth daily.     No facility-administered medications prior to visit.      Allergies:   Codeine; Atorvastatin; Penicillins; and Simvastatin   Social History   Social History  . Marital status: Widowed    Spouse name: N/A  . Number of children: N/A  . Years of education: N/A   Social History Main Topics  . Smoking status: Never Smoker  . Smokeless tobacco: Never Used  . Alcohol use 0.0 oz/week     Comment: every day glass of wine  . Drug use: No  . Sexual activity: Not on file   Other Topics Concern  . Not on file   Social History Narrative  . No narrative on file    Family History:  The patient's family history includes Alcoholism in her father; Asthma in her brother and sister; Diabetes in her brother; Heart attack in her brother; Stroke in her father and mother; Thyroid disease in her mother, sister, and sister.   ROS:   Please see the history of present illness.    ROS All other systems reviewed and are negative.   PHYSICAL EXAM:   VS:  BP 120/74   Pulse 75   Ht 5' 1.5" (1.562 m)   Wt 148 lb (67.1 kg)   BMI 27.51 kg/m    GEN: Well nourished, well developed, in no acute distress  HEENT: normal  Neck: no JVD, carotid bruits, or masses Cardiac: RRR; no murmurs, rubs, or gallops,no edema  Respiratory:  clear to auscultation bilaterally, normal work of breathing GI: soft, nontender, nondistended, + BS MS: no deformity or atrophy  Skin: warm and dry, no rash Neuro:  Alert and Oriented x 3, Strength and sensation are intact Psych: euthymic mood, full affect  Wt Readings from Last 3 Encounters:  02/14/17 148 lb  (67.1 kg)  08/24/16 150 lb (68 kg)  12/15/15 150 lb 1.9 oz (68.1 kg)    Studies/Labs Reviewed:   EKG:  EKG is ordered today.  The ekg ordered today demonstrates Normal sinus rhythm with possible prior anterior MI, that is unchanged from Junior 26 2017.  Recent Labs: No results found for requested labs within last 8760 hours.   Lipid Panel No results found for: CHOL, TRIG, HDL, CHOLHDL, VLDL, LDLCALC, LDLDIRECT  TTE: 12/2014  - Left ventricle: The cavity size was normal. Wall thickness was normal. Systolic function was normal. The estimated ejection fraction was in the range of 55% to 60%. Wall motion was normal; there were no regional wall motion abnormalities. Features are consistent with a  pseudonormal left ventricular filling pattern, with concomitant abnormal relaxation and increased filling pressure (grade 2 diastolic dysfunction). - Aortic valve: There was no stenosis. - Aorta: Aortic root dimension: 36 mm (ED). - Aortic root: The aortic root was borderline dilated. - Mitral valve: Mildly calcified annulus. Mildly calcified leaflets . There was no significant regurgitation. - Left atrium: The atrium was moderately dilated. - Right ventricle: The cavity size was normal. Systolic function was normal. - Pulmonary arteries: No complete TR doppler jet so unable to estimate PA systolic pressure. - Inferior vena cava: The vessel was normal in size. The respirophasic diameter changes were in the normal range (= 50%), consistent with normal central venous pressure.  Impressions: - Normal LV size and systolic function, EF 52-84%. Moderate diastolic dysfunction. Normal RV size and systolic function. Moderate left atrial enlargement. No significant valvular abnormalities.   ASSESSMENT:    1. Acute on chronic diastolic CHF (congestive heart failure), NYHA class 3 ([place])   2. Essential hypertension   3. Diastolic dysfunction     PLAN:  In order of  problems listed above:  1. Grade 2 DD, acute on chronic diastolic CHF - well compensated, continue Lasix 20 mg po daily 2. Essential hypertension - well controlled  Medication Adjustments/Labs and Tests Ordered: Current medicines are reviewed at length with the patient today.  Concerns regarding medicines are outlined above.  Medication changes, Labs and Tests ordered today are listed in the Patient Instructions below. Patient Instructions  Medication Instructions:   Your physician recommends that you continue on your current medications as directed. Please refer to the Current Medication list given to you today.     Follow-Up:  Your physician wants you to follow-up in: [place] will receive a reminder letter in the mail two months in advance. If you don't receive a letter, please call our office to schedule the follow-up appointment.        If you need a refill on your cardiac medications before your next appointment, please call your pharmacy.      Signed, Ena Dawley, MD  02/17/2017 6:58 PM    [address], [place], [place]  [address] Phone: [phone]; Fax: [phone]

## 2017-02-14 NOTE — Patient Instructions (Signed)

## 2017-06-11 ENCOUNTER — Telehealth: Payer: Self-pay | Admitting: Cardiology

## 2017-06-11 NOTE — Telephone Encounter (Signed)
ASSESSMENT/PLAN: [name] is a 76 y.o. year old patient being seen in clinic for the following:  1. 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 No new signs or symptoms suggestive of relapse. Her ua is not suggestive of GN or UTI. I reviewed her urine sediment. BMP and CBC pending. Will check ANCA at next visit.  2. HTN with HFpEF. Continue lasix 20 mg qd. Her BP's are elevated today. I manually checked her BP and confirmed that the diastolic pressure is elevated. I think that her BP measurement today is confounded by her irregular heart rate and rhythm. I do not have an ability to obtain an EKG. Therefore, I have recommended that she see her Cardiologist Dr Meda Coffee. I have called Dr Francesca Oman office at [phone] and [phone]. I am waiting for a phone call. I would recommend an EKG as a starting point and consideration for a Holter Monitor. Nephrologist Dr. Theodora Blow    Above is the pts consult note with her Nephrologist at [facility].  Scheduled the pt a follow-up appt here in our office for 06/27/17 at 3:30 pm, with Cecilie Kicks NP .  This is due to pts Nephrologist felt that her elevated BP readings at that visit was coming from the pt having an irregular HR and rhythm, and follow-up with Cards will be necessary to further evaluate this.  Dr. Theodora Blow felt that the pt needed an EKG at her Cards visit, and may need to have an order for a monitor placed, to assess irregularity.  Advised the pt to arrive to her 8/9 appt 15 mins prior too her scheduled time. Pt verbalized understanding and agrees with this plan.  Will route Dr Meda Coffee this note as an Juluis Rainier, to make her aware of pt coming in to see Carolyn Vazquez on 8/9, as referred by her Nephrologist.

## 2017-06-11 NOTE — Telephone Encounter (Signed)
Patient calling, states that she had an appt with her Nephrologist Raven Boora and Dr. Theodora Blow "had trouble with patient heartbeat."  Patient states that Dr. Theodora Blow was supposed to contact our office in regards to issue and was calling to see if Dr. Meda Coffee was aware of the matter.

## 2017-06-27 ENCOUNTER — Encounter: Payer: Self-pay | Admitting: Cardiology

## 2017-06-27 ENCOUNTER — Ambulatory Visit (INDEPENDENT_AMBULATORY_CARE_PROVIDER_SITE_OTHER): Payer: Medicare Other | Admitting: Cardiology

## 2017-06-27 ENCOUNTER — Encounter (INDEPENDENT_AMBULATORY_CARE_PROVIDER_SITE_OTHER): Payer: Self-pay

## 2017-06-27 VITALS — BP 124/64 | HR 70 | Ht 61.5 in | Wt 149.0 lb

## 2017-06-27 DIAGNOSIS — R0609 Other forms of dyspnea: Secondary | ICD-10-CM

## 2017-06-27 DIAGNOSIS — I776 Arteritis, unspecified: Secondary | ICD-10-CM | POA: Diagnosis not present

## 2017-06-27 DIAGNOSIS — I10 Essential (primary) hypertension: Secondary | ICD-10-CM

## 2017-06-27 DIAGNOSIS — R0600 Dyspnea, unspecified: Secondary | ICD-10-CM

## 2017-06-27 DIAGNOSIS — I499 Cardiac arrhythmia, unspecified: Secondary | ICD-10-CM | POA: Diagnosis not present

## 2017-06-27 DIAGNOSIS — R000 Tachycardia, unspecified: Secondary | ICD-10-CM | POA: Diagnosis not present

## 2017-06-27 DIAGNOSIS — M3130 Wegener's granulomatosis without renal involvement: Secondary | ICD-10-CM

## 2017-06-27 NOTE — Patient Instructions (Signed)
Medication Instructions:  Your physician recommends that you continue on your current medications as directed. Please refer to the Current Medication list given to you today.   Labwork: None Ordered   Testing/Procedures: Your physician has recommended that you wear an event monitor. Event monitors are medical devices that record the heart's electrical activity. Doctors most often [place] these monitors to diagnose arrhythmias. Arrhythmias are problems with the speed or rhythm of the heartbeat. The monitor is a small, portable device. You can wear one while you do your normal daily activities. This is usually used to diagnose what is causing palpitations/syncope (passing out).    Follow-Up: Your physician recommends that you schedule a follow-up appointment in: 2 months with Dr. Meda Coffee.   Any Other Special Instructions Will Be Listed Below (If Applicable).     If you need a refill on your cardiac medications before your next appointment, please call your pharmacy.

## 2017-06-27 NOTE — Progress Notes (Signed)
Cardiology Office Note   Date:  06/27/2017   ID:  Vazquez, Carolyn 01-25-1941, MRN [id number]  PCP:  Carolyn Jordan, MD  Cardiologist:  Dr. Meda Vazquez    Chief Complaint  Patient presents with  . Tachycardia      History of Present Illness: Carolyn Vazquez is a 76 y.o. female who presents for irregular HR.     She does not have any known cardiac disease. 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 It initially presented as an abnormal chest x-ray and she underwent biopsy which yielded the diagnosis of Wegener's granulomatosis. Since 2000 she has been followed by the pulmonary department at [facility]. In 2003 she had some left upper chest discomfort and had another biopsy which again revealed Wegener's granulomatosis.. She has been treated over the years with immunosuppressive therapy.   Also a hx of peripheral edema which resolves after she was placed on losartan HCT by her physicians at [facility]. She reports that in October 2015 she had pulmonary function studies at [facility] and subsequently was given a trial of steroid inhaler for several weeks but she did not notice any improvement in her symptoms and therefore stopped the inhaler. Her last nuclear stress test was in 2008 at [facility].  On recent visit with her nephrologist Dr Carolyn Vazquez she had rapid irregular HR.  They were unable to do EKG.  She is here today for eval.  She does admit to rapaid HR when she is stressed.   It does not last long. She is stressed when it occurs, her granddaughter and great grandaughter that is 4 weeks old live with her and has trouble with her stomach.  On the visit with Dr. Kelly Vazquez she could not find office and was very stressed.  Her diastolic BP was elevated at 101 as well.  She does not have dizziness with this or chest pain.  She has chronic DOE, that may be some worse than in 2016 but she does have Wegener's  granulomatosis and has been followed by Pulmonary.    We discussed the danger if it were to be atrial fib.  She has worn a monitor in the past but it was ambulatory BP monitor.  Her CHA2DS2VASc score is 4.  Currently in SR with PVCs occ to rare. Labs at Dr. Kelly Vazquez  With K+ 3.7, BUN 15 Cr 0.69.  Glucose 103 her TSH in Jan was 1.74.     Past Medical History:  Diagnosis Date  . Chronic kidney disease   . Hyperlipidemia   . Hypertension   . Thyroid disease     Past Surgical History:  Procedure Laterality Date  . ABDOMINAL HYSTERECTOMY  1979  . CHOLECYSTECTOMY    . 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 NASAL SINUS SURGERY       Current Outpatient Prescriptions  Medication Sig Dispense Refill  . acetaminophen (TYLENOL) 325 MG tablet Take 325 mg by mouth daily as needed. Mild pain    . amLODipine (NORVASC) 2.5 MG tablet Take 1 tablet (2.5 mg total) by mouth daily. 90 tablet 6  . aspirin 81 MG tablet Take 81 mg by mouth daily.    . B Complex Vitamins (B-COMPLEX/B-12) TABS Take 1 tablet by mouth daily.     . Calcium Carbonate Antacid (TUMS PO) Take 1 tablet by mouth daily.    . Cholecalciferol (VITAMIN D3) 1000 UNITS CAPS Take  2,000 mg by mouth daily.    . fexofenadine (ALLEGRA) 180 MG tablet Take 180 mg by mouth daily.    . furosemide (LASIX) 20 MG tablet Take 1 tablet (20 mg total) by mouth daily. 90 tablet 6  . levothyroxine (SYNTHROID) 100 MCG tablet Take 1 tablet (100 mcg total) by mouth daily before breakfast.    . losartan (COZAAR) 100 MG tablet Take 100 mg by mouth every morning.    . Multiple Vitamins-Minerals (ICAPS) CAPS Take 1 tablet by mouth 2 (two) times daily.    . Naproxen Sodium 220 MG CAPS Take 220 mg by mouth daily as needed. headache     No current facility-administered medications for this visit.     Allergies:   Codeine; Atorvastatin; Penicillins; and Simvastatin    Social History:  The patient  reports that she has never smoked. She has never used smokeless  tobacco. She reports that she drinks alcohol. She reports that she does not use drugs.   Family History:  The patient's family history includes Alcoholism in her father; Asthma in her brother and sister; Diabetes in her brother; Heart attack in her brother; Stroke in her father and mother; Thyroid disease in her mother, sister, and sister.    ROS:  General:no colds or fevers, no weight changes Skin:no rashes or ulcers HEENT:no blurred vision, no congestion CV:see HPI PUL:see HPI GI:no diarrhea constipation or melena, no indigestion GU:no hematuria, no dysuria MS:no joint pain, no claudication Neuro:no syncope, no lightheadedness Endo:no diabetes, + thyroid disease  Wt Readings from Last 3 Encounters:  06/27/17 149 lb (67.6 kg)  02/14/17 148 lb (67.1 kg)  08/24/16 150 lb (68 kg)     PHYSICAL EXAM: VS:  BP 124/64   Pulse 70   Ht 5' 1.5" (1.562 m)   Wt 149 lb (67.6 kg)   BMI 27.70 kg/m  , BMI Body mass index is 27.7 kg/m. General:Pleasant affect, NAD Skin:Warm and dry, brisk capillary refill HEENT:normocephalic, sclera clear, mucus membranes moist Neck:supple, no JVD, no bruits  Heart:S1S2 RRR without murmur, gallup, rub or click Lungs:clear to diminished without rales, rhonchi, or wheezes BJY:NWGN, non tender, + BS, do not palpate liver spleen or masses Ext:no lower ext edema, 2+ pedal pulses, 2+ radial pulses Neuro:alert and oriented X 3, MAE, follows commands, + facial symmetry    EKG:  EKG is ordered today. The ekg ordered today demonstrates SR with PVCs Q wave in III but no changes from previous tracing.    Recent Labs: No results found for requested labs within last 8760 hours.    Lipid Panel No results found for: CHOL, TRIG, HDL, CHOLHDL, VLDL, LDLCALC, LDLDIRECT     Other studies Reviewed: Additional studies/ records that were reviewed today include: . ECHO 12/24/14 Study Conclusions  - Left ventricle: The cavity size was normal. Wall thickness  was normal. Systolic function was normal. The estimated ejection fraction was in the range of 55% to 60%. Wall motion was normal; there were no regional wall motion abnormalities. Features are consistent with a pseudonormal left ventricular filling pattern, with concomitant abnormal relaxation and increased filling pressure (grade 2 diastolic dysfunction). - Aortic valve: There was no stenosis. - Aorta: Aortic root dimension: 36 mm (ED). - Aortic root: The aortic root was borderline dilated. - Mitral valve: Mildly calcified annulus. Mildly calcified leaflets . There was no significant regurgitation. - Left atrium: The atrium was moderately dilated. - Right ventricle: The cavity size was normal. Systolic function was normal. -  Pulmonary arteries: No complete TR doppler jet so unable to estimate PA systolic pressure. - Inferior vena cava: The vessel was normal in size. The respirophasic diameter changes were in the normal range (= 50%), consistent with normal central venous pressure.  Impressions:  - Normal LV size and systolic function, EF 24-23%. Moderate diastolic dysfunction. Normal RV size and systolic function. Moderate left atrial enlargement. No significant valvular abnormalities.  NUC 2016 was neg for ischemia and EF 68%.   ASSESSMENT AND PLAN:  1.  Irregular HR with tachycardia, on previous MD visit--today EKG with SR with PVCs no a fib.  Have asked her to wear 30 day event monitor to see her tachycardia and irregular HR.  Labs stable then follow up wit Dr. Meda Vazquez in 2 months.  If she develops rapid HR that does not subside she should go to ER.   2.  HTN controlled  3. DOE stable  4. Wegener's granulomatosis with vasculitis.followed at [facility].   Current medicines are reviewed with the patient today.  The patient Has no concerns regarding medicines.  The following changes have been made:  See above Labs/ tests ordered today  include:see above  Disposition:   FU:  see above  Signed, Cecilie Kicks, NP  06/27/2017 4:46 PM    [facility] [place], [place], [place], [place] Phone: [phone]; Fax: [phone]

## 2017-06-28 NOTE — Addendum Note (Signed)
Addended by: Marlis Edelson C on: 06/28/2017 12:35 PM   Modules accepted: Orders

## 2017-07-06 ENCOUNTER — Encounter (HOSPITAL_COMMUNITY): Payer: Self-pay

## 2017-07-06 ENCOUNTER — Inpatient Hospital Stay (HOSPITAL_COMMUNITY)
Admission: EM | Admit: 2017-07-06 | Discharge: 2017-07-09 | DRG: 065 | Disposition: A | Discharge status: Home Health | Payer: Medicare Other | Attending: Family Medicine | Admitting: Family Medicine

## 2017-07-06 ENCOUNTER — Emergency Department (HOSPITAL_COMMUNITY): Payer: Medicare Other

## 2017-07-06 DIAGNOSIS — Z791 Long term (current) use of non-steroidal anti-inflammatories (NSAID): Secondary | ICD-10-CM

## 2017-07-06 DIAGNOSIS — I481 Persistent atrial fibrillation: Secondary | ICD-10-CM | POA: Diagnosis present

## 2017-07-06 DIAGNOSIS — Z7982 Long term (current) use of aspirin: Secondary | ICD-10-CM

## 2017-07-06 DIAGNOSIS — E785 Hyperlipidemia, unspecified: Secondary | ICD-10-CM

## 2017-07-06 DIAGNOSIS — I2510 Atherosclerotic heart disease of native coronary artery without angina pectoris: Secondary | ICD-10-CM | POA: Diagnosis present

## 2017-07-06 DIAGNOSIS — Z90710 Acquired absence of both cervix and uterus: Secondary | ICD-10-CM

## 2017-07-06 DIAGNOSIS — Z823 Family history of stroke: Secondary | ICD-10-CM

## 2017-07-06 DIAGNOSIS — Z880 Allergy status to penicillin: Secondary | ICD-10-CM | POA: Diagnosis not present

## 2017-07-06 DIAGNOSIS — Z9049 Acquired absence of other specified parts of digestive tract: Secondary | ICD-10-CM | POA: Diagnosis not present

## 2017-07-06 DIAGNOSIS — I6340 Cerebral infarction due to embolism of unspecified cerebral artery: Secondary | ICD-10-CM | POA: Diagnosis present

## 2017-07-06 DIAGNOSIS — Z888 Allergy status to other drugs, medicaments and biological substances status: Secondary | ICD-10-CM

## 2017-07-06 DIAGNOSIS — G459 Transient cerebral ischemic attack, unspecified: Secondary | ICD-10-CM | POA: Diagnosis present

## 2017-07-06 DIAGNOSIS — Z825 Family history of asthma and other chronic lower respiratory diseases: Secondary | ICD-10-CM

## 2017-07-06 DIAGNOSIS — I340 Nonrheumatic mitral (valve) insufficiency: Secondary | ICD-10-CM | POA: Diagnosis not present

## 2017-07-06 DIAGNOSIS — Z8349 Family history of other endocrine, nutritional and metabolic diseases: Secondary | ICD-10-CM

## 2017-07-06 DIAGNOSIS — M3130 Wegener's granulomatosis without renal involvement: Secondary | ICD-10-CM | POA: Diagnosis present

## 2017-07-06 DIAGNOSIS — Z833 Family history of diabetes mellitus: Secondary | ICD-10-CM | POA: Diagnosis not present

## 2017-07-06 DIAGNOSIS — Z79890 Hormone replacement therapy (postmenopausal): Secondary | ICD-10-CM

## 2017-07-06 DIAGNOSIS — I4891 Unspecified atrial fibrillation: Secondary | ICD-10-CM | POA: Diagnosis not present

## 2017-07-06 DIAGNOSIS — R29700 NIHSS score 0: Secondary | ICD-10-CM | POA: Diagnosis present

## 2017-07-06 DIAGNOSIS — N189 Chronic kidney disease, unspecified: Secondary | ICD-10-CM | POA: Diagnosis present

## 2017-07-06 DIAGNOSIS — R4701 Aphasia: Secondary | ICD-10-CM | POA: Diagnosis present

## 2017-07-06 DIAGNOSIS — G458 Other transient cerebral ischemic attacks and related syndromes: Secondary | ICD-10-CM

## 2017-07-06 DIAGNOSIS — Z8249 Family history of ischemic heart disease and other diseases of the circulatory system: Secondary | ICD-10-CM

## 2017-07-06 DIAGNOSIS — E039 Hypothyroidism, unspecified: Secondary | ICD-10-CM | POA: Diagnosis present

## 2017-07-06 DIAGNOSIS — I480 Paroxysmal atrial fibrillation: Secondary | ICD-10-CM | POA: Diagnosis present

## 2017-07-06 DIAGNOSIS — R1310 Dysphagia, unspecified: Secondary | ICD-10-CM | POA: Diagnosis present

## 2017-07-06 DIAGNOSIS — Z79899 Other long term (current) drug therapy: Secondary | ICD-10-CM

## 2017-07-06 DIAGNOSIS — Z885 Allergy status to narcotic agent status: Secondary | ICD-10-CM

## 2017-07-06 DIAGNOSIS — Z811 Family history of alcohol abuse and dependence: Secondary | ICD-10-CM | POA: Diagnosis not present

## 2017-07-06 DIAGNOSIS — E782 Mixed hyperlipidemia: Secondary | ICD-10-CM

## 2017-07-06 DIAGNOSIS — I129 Hypertensive chronic kidney disease with stage 1 through stage 4 chronic kidney disease, or unspecified chronic kidney disease: Secondary | ICD-10-CM | POA: Diagnosis present

## 2017-07-06 LAB — COMPREHENSIVE METABOLIC PANEL
ALBUMIN: 4.3 g/dL (ref 3.5–5.0)
ALK PHOS: 94 U/L (ref 38–126)
ALT: 23 U/L (ref 14–54)
AST: 33 U/L (ref 15–41)
Anion gap: 12 (ref 5–15)
BILIRUBIN TOTAL: 0.9 mg/dL (ref 0.3–1.2)
BUN: 10 mg/dL (ref 6–20)
CO2: 26 mmol/L (ref 22–32)
Calcium: 9.8 mg/dL (ref 8.9–10.3)
Chloride: 104 mmol/L (ref 101–111)
Creatinine, Ser: 0.81 mg/dL (ref 0.44–1.00)
GFR calc Af Amer: >60 mL/min (ref >60)
GFR calc non Af Amer: >60 mL/min (ref >60)
GLUCOSE: 148 mg/dL — AB (ref 65–99)
POTASSIUM: 3.6 mmol/L (ref 3.5–5.1)
Sodium: 142 mmol/L (ref 135–145)
TOTAL PROTEIN: 7.7 g/dL (ref 6.5–8.1)

## 2017-07-06 LAB — CBC WITH DIFFERENTIAL/PLATELET
Basophils Absolute: 0.1 10*3/uL (ref 0.0–0.1)
Basophils Relative: 1 %
EOS ABS: 0.4 10*3/uL (ref 0.0–0.7)
Eosinophils Relative: 5 %
HCT: 42.5 % (ref 36.0–46.0)
HEMOGLOBIN: 14.8 g/dL (ref 12.0–15.0)
LYMPHS ABS: 2.7 10*3/uL (ref 0.7–4.0)
LYMPHS PCT: 31 %
MCH: 31.9 pg (ref 26.0–34.0)
MCHC: 34.8 g/dL (ref 30.0–36.0)
MCV: 91.6 fL (ref 78.0–100.0)
MONOS PCT: 7 %
Monocytes Absolute: 0.6 10*3/uL (ref 0.1–1.0)
NEUTROS PCT: 56 %
Neutro Abs: 5 10*3/uL (ref 1.7–7.7)
Platelets: 292 10*3/uL (ref 150–400)
RBC: 4.64 MIL/uL (ref 3.87–5.11)
RDW: 12.4 % (ref 11.5–15.5)
WBC: 8.8 10*3/uL (ref 4.0–10.5)

## 2017-07-06 LAB — I-STAT CHEM 8, ED
BUN: 9 mg/dL (ref 6–20)
CALCIUM ION: 1.18 mmol/L (ref 1.15–1.40)
CHLORIDE: 103 mmol/L (ref 101–111)
Creatinine, Ser: 0.9 mg/dL (ref 0.44–1.00)
GLUCOSE: 148 mg/dL — AB (ref 65–99)
HCT: 44 % (ref 36.0–46.0)
Hemoglobin: 15 g/dL (ref 12.0–15.0)
Potassium: 3.6 mmol/L (ref 3.5–5.1)
Sodium: 142 mmol/L (ref 135–145)
TCO2: 28 mmol/L (ref 0–100)

## 2017-07-06 MED ORDER — ACETAMINOPHEN 325 MG PO TABS
650.0000 mg | ORAL_TABLET | ORAL | Status: DC | PRN
  Administered 2017-07-06 – 2017-07-09 (×6): 650 mg via ORAL
  Filled 2017-07-06 (×6): qty 2

## 2017-07-06 MED ORDER — DILTIAZEM LOAD VIA INFUSION
15.0000 mg | Freq: Once | INTRAVENOUS | Status: AC
  Administered 2017-07-06: 15 mg via INTRAVENOUS
  Filled 2017-07-06: qty 15

## 2017-07-06 MED ORDER — LOSARTAN POTASSIUM 50 MG PO TABS
100.0000 mg | ORAL_TABLET | Freq: Every day | ORAL | Status: DC
  Administered 2017-07-07: 100 mg via ORAL
  Filled 2017-07-06: qty 2

## 2017-07-06 MED ORDER — VITAMIN D 1000 UNITS PO TABS
2000.0000 [IU] | ORAL_TABLET | Freq: Every day | ORAL | Status: DC
  Administered 2017-07-07 – 2017-07-09 (×4): 2000 [IU] via ORAL
  Filled 2017-07-06 (×6): qty 2

## 2017-07-06 MED ORDER — MORPHINE SULFATE (PF) 2 MG/ML IV SOLN
4.0000 mg | Freq: Once | INTRAVENOUS | Status: AC
  Administered 2017-07-06: 4 mg via INTRAVENOUS
  Filled 2017-07-06: qty 2

## 2017-07-06 MED ORDER — STROKE: EARLY STAGES OF RECOVERY BOOK
Freq: Once | Status: AC
  Administered 2017-07-06: 18:00:00
  Filled 2017-07-06: qty 1

## 2017-07-06 MED ORDER — ASPIRIN EC 81 MG PO TBEC
81.0000 mg | DELAYED_RELEASE_TABLET | Freq: Every day | ORAL | Status: DC
  Administered 2017-07-07: 81 mg via ORAL
  Filled 2017-07-06 (×2): qty 1

## 2017-07-06 MED ORDER — ACETAMINOPHEN 650 MG RE SUPP: 650.0000 mg | RECTAL | Status: DC | PRN

## 2017-07-06 MED ORDER — DILTIAZEM HCL-DEXTROSE 100-5 MG/100ML-% IV SOLN (PREMIX)
5.0000 mg/h | Freq: Once | INTRAVENOUS | Status: AC
  Administered 2017-07-06: 5 mg/h via INTRAVENOUS
  Filled 2017-07-06: qty 100

## 2017-07-06 MED ORDER — B COMPLEX-C PO TABS
1.0000 | ORAL_TABLET | Freq: Every day | ORAL | Status: DC
  Administered 2017-07-07 – 2017-07-09 (×3): 1 via ORAL
  Filled 2017-07-06 (×3): qty 1

## 2017-07-06 MED ORDER — LEVOTHYROXINE SODIUM 100 MCG PO TABS
100.0000 ug | ORAL_TABLET | Freq: Every day | ORAL | Status: DC
  Administered 2017-07-07 – 2017-07-09 (×2): 100 ug via ORAL
  Filled 2017-07-06 (×3): qty 1

## 2017-07-06 MED ORDER — DILTIAZEM HCL 25 MG/5ML IV SOLN: 15.0000 mg | Freq: Once | INTRAVENOUS | Status: DC

## 2017-07-06 MED ORDER — ACETAMINOPHEN 160 MG/5ML PO SOLN: 650.0000 mg | ORAL | Status: DC | PRN

## 2017-07-06 MED ORDER — ONDANSETRON HCL 4 MG/2ML IJ SOLN
4.0000 mg | Freq: Once | INTRAMUSCULAR | Status: AC
  Administered 2017-07-06: 4 mg via INTRAVENOUS
  Filled 2017-07-06: qty 2

## 2017-07-06 MED ORDER — HEPARIN (PORCINE) IN NACL 100-0.45 UNIT/ML-% IJ SOLN
1000.0000 [IU]/h | Freq: Once | INTRAMUSCULAR | Status: DC
  Filled 2017-07-06: qty 250

## 2017-07-06 MED ORDER — AMLODIPINE BESYLATE 2.5 MG PO TABS
2.5000 mg | ORAL_TABLET | Freq: Every day | ORAL | Status: DC
  Administered 2017-07-06 – 2017-07-07 (×2): 2.5 mg via ORAL
  Filled 2017-07-06 (×2): qty 1

## 2017-07-06 MED ORDER — PROMETHAZINE HCL 25 MG/ML IJ SOLN
12.5000 mg | Freq: Four times a day (QID) | INTRAMUSCULAR | Status: DC | PRN
  Administered 2017-07-06 – 2017-07-08 (×3): 12.5 mg via INTRAVENOUS
  Filled 2017-07-06 (×3): qty 1

## 2017-07-06 MED ORDER — HYDROMORPHONE HCL 1 MG/ML IJ SOLN
0.5000 mg | Freq: Once | INTRAMUSCULAR | Status: AC
  Administered 2017-07-06: 0.5 mg via INTRAVENOUS
  Filled 2017-07-06: qty 0.5

## 2017-07-06 MED ORDER — HEPARIN (PORCINE) IN NACL 100-0.45 UNIT/ML-% IJ SOLN
900.0000 [IU]/h | INTRAMUSCULAR | Status: DC
  Administered 2017-07-06: 1000 [IU]/h via INTRAVENOUS
  Filled 2017-07-06: qty 250

## 2017-07-06 MED ORDER — ACETAMINOPHEN 325 MG PO TABS: 325.0000 mg | ORAL_TABLET | Freq: Four times a day (QID) | ORAL | Status: DC | PRN

## 2017-07-06 MED ORDER — IOPAMIDOL (ISOVUE-300) INJECTION 61%
INTRAVENOUS | Status: AC
  Administered 2017-07-06: 75 mL
  Filled 2017-07-06: qty 75

## 2017-07-06 NOTE — ED Provider Notes (Signed)
Vitals reviewed.  Appears stable for transfer   Dorie Rank, MD 07/06/17 2018

## 2017-07-06 NOTE — ED Notes (Signed)
Advised by EDP Dr. Jeneen Rinks to hold off on starting the heparin drip until we get a go ahead from neurology. Expect the go ahead from the receiving hand off EDP Dr. Tomi Bamberger.

## 2017-07-06 NOTE — ED Notes (Signed)
Please keep daughter [name] updated on [phone].

## 2017-07-06 NOTE — H&P (Addendum)
History and Physical    Carolyn Vazquez FGH:[id number] DOB: 08-15-1941 DOA: 07/06/2017  PCP: Carolyn Jordan, MD Patient coming from: home  Chief Complaint: slurred speech  HPI: Carolyn Vazquez is a 76 y.o. female with medical history significant for hypothyroidism hypertension, CK D,wegnersgranulomatosis, admitted with  Nausea headache, slurred speech and drooling of saliva. Patient was unable to drink through a straw today. Symptoms started at 10:30 AM today. and by 1:30 she was in the emergency room. She is back to baseline after she reached the emergency room. She had an echocardiogram done in February 2016 which showed ejection fraction 55-60% with a normal LV size and function. I do not have a TSH on her. She reports drinking wine every night and drinking a cup of Vazquez every morning. She saw her cardiologist early this week for possible atrial fibrillation and was supposed to have a Holter monitor placed. Patient's cardiologist is Dr. Meda Vazquez. Patient does have a nephrologist. Patient also has a neurologist.   ED Course: Patient had a CT of the head which did not reveal anything acute. CT angiogram did not reveal anything acute. The ER doctor spoke to the neuro hospitalist on call and was advised to start the patient on a heparin drip and to transfer and admit the patient to [facility]. Patient was started on Cardizem drip due to rapid A. fib in the ER.   Review of Systems: As per HPI otherwise all other systems reviewed and are negative  Ambulatory Status  Past Medical History:  Diagnosis Date  . Chronic kidney disease   . Hyperlipidemia   . Hypertension   . Thyroid disease     Past Surgical History:  Procedure Laterality Date  . ABDOMINAL HYSTERECTOMY  1979  . CHOLECYSTECTOMY    . 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 NASAL SINUS SURGERY      Social History   Social History  . Marital status: Widowed    Spouse name: N/A  . Number of children: N/A  .  Years of education: N/A   Occupational History  . Not on file.   Social History Main Topics  . Smoking status: Never Smoker  . Smokeless tobacco: Never Used  . Alcohol use 0.0 oz/week     Comment: every day glass of wine  . Drug use: No  . Sexual activity: Not on file   Other Topics Concern  . Not on file   Social History Narrative  . No narrative on file    Allergies  Allergen Reactions  . Codeine Anaphylaxis  . Atorvastatin Other (See Comments)    Depression   . Penicillins Rash  . Simvastatin Other (See Comments)    JOINT PAIN    Family History  Problem Relation Age of Onset  . Stroke Mother   . Thyroid disease Mother   . Stroke Father   . Alcoholism Father   . Asthma Sister   . Thyroid disease Sister   . Diabetes Brother   . Thyroid disease Sister   . Asthma Brother   . Heart attack Brother       Prior to Admission medications   Medication Sig Start Date End Date Taking? Authorizing Provider  acetaminophen (TYLENOL) 325 MG tablet Take 325 mg by mouth daily as needed. Mild pain    [provider]  amLODipine (NORVASC) 2.5 MG tablet Take 1 tablet (2.5 mg total) by mouth daily. 08/24/16   [name], MD  aspirin  81 MG tablet Take 81 mg by mouth daily. 05/10/11   [provider]  B Complex Vitamins (B-COMPLEX/B-12) TABS Take 1 tablet by mouth daily.  01/29/13   [provider]  Calcium Carbonate Antacid (TUMS PO) Take 1 tablet by mouth daily.    [provider]  Cholecalciferol (VITAMIN D3) 1000 UNITS CAPS Take 2,000 mg by mouth daily. 01/29/13   [provider]  fexofenadine (ALLEGRA) 180 MG tablet Take 180 mg by mouth daily. 03/02/10   [provider]  furosemide (LASIX) 20 MG tablet Take 1 tablet (20 mg total) by mouth daily. 08/24/16   [name], MD  levothyroxine (SYNTHROID) 100 MCG tablet Take 1 tablet (100 mcg total) by mouth daily before breakfast. 12/20/16   [name], MD  losartan  (COZAAR) 100 MG tablet Take 100 mg by mouth every morning.    [provider]  Multiple Vitamins-Minerals (ICAPS) CAPS Take 1 tablet by mouth 2 (two) times daily.    [provider]  Naproxen Sodium 220 MG CAPS Take 220 mg by mouth daily as needed. headache    [provider]    Physical Exam: Vitals:   07/06/17 1530 07/06/17 1540 07/06/17 1550 07/06/17 1600  BP: 137/90   (!) 143/94  Pulse: (!) 111 93 99 (!) 106  Resp: (!) 29 14 17  (!) 25  Temp:      TempSrc:      SpO2: 96% 96% 95% 97%  Weight:      Height:         General:  Appears calm and comfortable Eyes:PERRL, EOMI, normal lids, iris ENT: grossly normal hearing, lips & tongue, mmm Neck:  no LAD, masses or thyromegaly Cardiovascular: irregularly irregular no m/r/g. No LE edema.  Respiratory:  CTA bilaterally, no w/r/r. Normal respiratory effort. Abdomen: soft, ntnd, NABS Skin:  no rash or induration seen on limited exam Musculoskeletal:  grossly normal tone BUE/BLE, good ROM, no bony abnormality Psychiatric:  grossly normal mood and affect, speech fluent and appropriate, AOx3 Neurologic:  CN 2-12 grossly intact, moves all extremities in coordinated fashion, sensation intact  Labs on Admission: I have personally reviewed following labs and imaging studies  CBC:  Recent Labs Lab 07/06/17 1407 07/06/17 1433  WBC 8.8  --   NEUTROABS 5.0  --   HGB 14.8 15.0  HCT 42.5 44.0  MCV 91.6  --   PLT 292  --    Basic Metabolic Panel:  Recent Labs Lab 07/06/17 1407 07/06/17 1433  NA 142 142  K 3.6 3.6  CL 104 103  CO2 26  --   GLUCOSE 148* 148*  BUN 10 9  CREATININE 0.81 0.90  CALCIUM 9.8  --    GFR: Estimated Creatinine Clearance: 46.8 mL/min (by C-G formula based on SCr of 0.9 mg/dL). Liver Function Tests:  Recent Labs Lab 07/06/17 1407  AST 33  ALT 23  ALKPHOS 94  BILITOT 0.9  PROT 7.7  ALBUMIN 4.3   No results for input(s): LIPASE, AMYLASE in the last 168 hours. No  results for input(s): AMMONIA in the last 168 hours. Coagulation Profile: No results for input(s): INR, PROTIME in the last 168 hours. Cardiac Enzymes: No results for input(s): CKTOTAL, CKMB, CKMBINDEX, TROPONINI in the last 168 hours. BNP (last 3 results) No results for input(s): PROBNP in the last 8760 hours. HbA1C: No results for input(s): HGBA1C in the last 72 hours. CBG: No results for input(s): GLUCAP in the last 168 hours.  Lipid Profile: No results for input(s): CHOL, HDL, LDLCALC, TRIG, CHOLHDL, LDLDIRECT in the last 72 hours. Thyroid Function Tests: No results for input(s): TSH, T4TOTAL, FREET4, T3FREE, THYROIDAB in the last 72 hours. Anemia Panel: No results for input(s): VITAMINB12, FOLATE, FERRITIN, TIBC, IRON, RETICCTPCT in the last 72 hours. Urine analysis:    Component Value Date/Time   COLORURINE YELLOW 11/20/2007 0719   APPEARANCEUR HAZY (A) 11/20/2007 0719   LABSPEC 1.022 11/20/2007 0719   PHURINE 7.0 11/20/2007 0719   GLUCOSEU NEGATIVE 11/20/2007 0719   HGBUR SMALL (A) 11/20/2007 0719   BILIRUBINUR SMALL (A) 11/20/2007 0719   KETONESUR 15 (A) 11/20/2007 0719   PROTEINUR 100 (A) 11/20/2007 0719   UROBILINOGEN 0.2 11/20/2007 0719   NITRITE NEGATIVE 11/20/2007 0719   LEUKOCYTESUR NEGATIVE 11/20/2007 0719    Creatinine Clearance: Estimated Creatinine Clearance: 46.8 mL/min (by C-G formula based on SCr of 0.9 mg/dL).  Sepsis Labs: @LABRCNTIP (procalcitonin:4,lacticidven:4) )No results found for this or any previous visit (from the past 240 hour(s)).   Radiological Exams on Admission: Ct Head W & Wo Contrast  Result Date: 07/06/2017 CLINICAL DATA:  Sudden onset of severe headache. Recent diagnosis of atrial fibrillation. EXAM: CT HEAD WITHOUT AND WITH CONTRAST TECHNIQUE: Contiguous axial images were obtained from the base of the skull through the vertex without and with intravenous contrast CONTRAST:  45mL ISOVUE-300 IOPAMIDOL (ISOVUE-300) INJECTION 61%  COMPARISON:  None. FINDINGS: Brain: Mild age related volume loss. The brainstem and cerebellum appear normal. Cerebral hemispheres show minimal areas of low-density in the deep white matter consistent with mild small vessel change. No sign of acute infarction, mass lesion, hemorrhage, hydrocephalus or extra-axial collection. No abnormal enhancement. Vascular: There is atherosclerotic calcification of the major vessels at the base of the brain. No abnormal enhancement. No abnormal vascular finding. Skull: Normal Sinuses/Orbits: Minimal mucosal thickening of the paranasal sinuses, possibly subclinical. Orbits negative. Other: None IMPRESSION: No acute finding by CT. Minimal small vessel change of the hemispheric white matter. No abnormal contrast enhancement. Electronically Signed   By: Nelson Chimes M.D.   On: 07/06/2017 15:09    EKG: Independently reviewed.    Assessment/Plan Active Problems:   TIA (transient ischemic attack)  Rapid atrial fibrillation new onset in a patient with history of hypertension. We will continue with Cardizem drip for rate control.  Consult called into  cardiology Dr. Francesca Oman group. Will obtain a TSH and a cardiac echocardiogram.continue heparin drip.  Transient ischemic attack continue heparin drip. Follow-up echocardiogram. CT angiogram of the neck did not reveal any evidence of blockage. Neurology consulted from the emergency room.  Hypothyroidism check TSH and continue Synthroid.  Hypertension continue Norvasc and Cozaar.         DVT prophylaxis: heparin Code Status: full Family Communication: daughter Disposition Plan: home Consults called: neuro on call.will need to get cardiology consult dr Carolyn Vazquez.  Admission status: sdu   Georgette Shell MD [facility]  If 7PM-7AM, please contact night-coverage [email or web address] Password [email or web address]  07/06/2017, 4:10 PM

## 2017-07-06 NOTE — ED Notes (Signed)
Patient ambulated to the restroom with stand by assist. Patient did c/o of increased HA when she stood.

## 2017-07-06 NOTE — ED Provider Notes (Signed)
[facility] DEPT Provider Note   CSN: [id number] Arrival date & time: 07/06/17  1346     History   Chief Complaint Chief Complaint  Patient presents with  . Aphasia  . Headache    HPI Carolyn Vazquez is a 76 y.o. female. Complaint is headache, speech and swallowing difficulty, and weakness  HPI:  76 year-old female. No history of stroke cardiovascular disease. Gets frequent headaches. Has history of Wegener's granulomatosis. Currently symptomatically, and on no current treatment. Does not get sinus symptoms, occasionally gets pulmonary symptoms.  Awakened this morning feeling well. Last time known well 10 AM. Started feeling "strange" after coming home from a friend's house. She however was able to drive to the grocery store, and then drove through the drive-through at [employer]. States she was having trouble speaking, swallowing through a straw, and felt like she was drooling. However was able to drive herself home without incident.  She now feels weak "all over", and like "I could fall to the floor".  She describes an episode 2 weeks ago while at her nephrologist in [place]. States the pain is "not get my pulse". She was referred her cardiologist Dr. Meda Coffee. Was told to wear a Holter monitor and that she might be having "irregular heartbeats".  Past Medical History:  Diagnosis Date  . Chronic kidney disease   . Hyperlipidemia   . Hypertension   . Thyroid disease     Patient Active Problem List   Diagnosis Date Noted  . Hypertension, essential 10/31/2015  . Diastolic dysfunction 84/16/6063  . Dyspnea on exertion 12/15/2014  . Chest heaviness 12/15/2014  . 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 Chronic infection of sinus 08/12/2014  . Allergic rhinitis 06/11/2013  . Vasculitis ([place]) 06/11/2013    Past Surgical History:  Procedure Laterality Date  . ABDOMINAL HYSTERECTOMY  1979  . CHOLECYSTECTOMY    . LUNG SURGERY  2000's   both  vasculitis  . NASAL SINUS SURGERY      OB History    No data available       Home Medications    Prior to Admission medications   Medication Sig Start Date End Date Taking? Authorizing Provider  acetaminophen (TYLENOL) 325 MG tablet Take 325 mg by mouth daily as needed. Mild pain    [provider]  amLODipine (NORVASC) 2.5 MG tablet Take 1 tablet (2.5 mg total) by mouth daily. 08/24/16   [name], MD  aspirin 81 MG tablet Take 81 mg by mouth daily. 05/10/11   [provider]  B Complex Vitamins (B-COMPLEX/B-12) TABS Take 1 tablet by mouth daily.  01/29/13   [provider]  Calcium Carbonate Antacid (TUMS PO) Take 1 tablet by mouth daily.    [provider]  Cholecalciferol (VITAMIN D3) 1000 UNITS CAPS Take 2,000 mg by mouth daily. 01/29/13   [provider]  fexofenadine (ALLEGRA) 180 MG tablet Take 180 mg by mouth daily. 03/02/10   [provider]  furosemide (LASIX) 20 MG tablet Take 1 tablet (20 mg total) by mouth daily. 08/24/16   [name], MD  levothyroxine (SYNTHROID) 100 MCG tablet Take 1 tablet (100 mcg total) by mouth daily before breakfast. 12/20/16   [name], MD  losartan (COZAAR) 100 MG tablet Take 100 mg by mouth every morning.    [provider]  Multiple Vitamins-Minerals (ICAPS) CAPS Take 1 tablet by mouth 2 (two) times daily.    [provider]  Naproxen Sodium 220 MG CAPS Take 220 mg by mouth daily as needed. headache    [provider]    Family History Family History  Problem Relation Age of Onset  . Stroke Mother   . Thyroid disease Mother   . Stroke Father   . Alcoholism Father   . Asthma Sister   . Thyroid disease Sister   . Diabetes Brother   . Thyroid disease Sister   . Asthma Brother   . Heart attack Brother     Social History Social History  Substance Use Topics  . Smoking status: Never Smoker  . Smokeless tobacco: Never Used  . Alcohol  use 0.0 oz/week     Comment: every day glass of wine     Allergies   Codeine; Atorvastatin; Penicillins; and Simvastatin   Review of Systems Review of Systems  Constitutional: Negative for appetite change, chills, diaphoresis, fatigue and fever.  HENT: Negative for mouth sores, sore throat and trouble swallowing.   Eyes: Negative for visual disturbance.  Respiratory: Negative for cough, chest tightness, shortness of breath and wheezing.   Cardiovascular: Negative for chest pain.  Gastrointestinal: Negative for abdominal distention, abdominal pain, diarrhea, nausea and vomiting.  Endocrine: Negative for polydipsia, polyphagia and polyuria.  Genitourinary: Negative for dysuria, frequency and hematuria.  Musculoskeletal: Negative for gait problem.  Skin: Negative for color change, pallor and rash.  Neurological: Positive for dizziness, speech difficulty, weakness and light-headedness. Negative for syncope and headaches.  Hematological: Does not bruise/bleed easily.  Psychiatric/Behavioral: Negative for behavioral problems and confusion.     Physical Exam Updated Vital Signs BP (!) 157/103 (BP Location: Left Arm)   Pulse (!) 153   Temp (!) 97.1 F (36.2 C) Comment: axillary  Resp 20   Ht 5' 1.5" (1.562 m)   Wt 68 kg (150 lb)   SpO2 95%   BMI 27.88 kg/m   Physical Exam  Constitutional: She is oriented to person, place, and time. She appears well-developed and well-nourished. No distress.  HENT:  Head: Normocephalic.  Eyes: Pupils are equal, round, and reactive to light. Conjunctivae are normal. No scleral icterus.  Neck: Normal range of motion. Neck supple. No thyromegaly present.  Cardiovascular: Normal rate and regular rhythm.  Exam reveals no gallop and no friction rub.   No murmur heard. Irregularly and rapid.   Pulmonary/Chest: Effort normal and breath sounds normal. No respiratory distress. She has no wheezes. She has no rales.  Abdominal: Soft. Bowel sounds are  normal. She exhibits no distension. There is no tenderness. There is no rebound.  Musculoskeletal: Normal range of motion.  Neurological: She is alert and oriented to person, place, and time.  NIH 0. Symmetric smile. Intact cranial nerves. No carotid bruits. We will for tremors without leg or pronator drift  Skin: Skin is warm and dry. No rash noted.  Psychiatric: She has a normal mood and affect. Her behavior is normal.     ED Treatments / Results  Labs (all labs ordered are listed, but only abnormal results are displayed) Labs Reviewed  COMPREHENSIVE METABOLIC PANEL - Abnormal; Notable for the following:       Result Value   Glucose, Bld 148 (*)    All other components within normal limits  I-STAT CHEM 8, ED - Abnormal; Notable for the following:    Glucose, Bld 148 (*)    All other components within normal limits  CBC WITH DIFFERENTIAL/PLATELET    EKG  EKG Interpretation None  Radiology Ct Head W & Wo Contrast  Result Date: 07/06/2017 CLINICAL DATA:  Sudden onset of severe headache. Recent diagnosis of atrial fibrillation. EXAM: CT HEAD WITHOUT AND WITH CONTRAST TECHNIQUE: Contiguous axial images were obtained from the base of the skull through the vertex without and with intravenous contrast CONTRAST:  73mL ISOVUE-300 IOPAMIDOL (ISOVUE-300) INJECTION 61% COMPARISON:  None. FINDINGS: Brain: Mild age related volume loss. The brainstem and cerebellum appear normal. Cerebral hemispheres show minimal areas of low-density in the deep white matter consistent with mild small vessel change. No sign of acute infarction, mass lesion, hemorrhage, hydrocephalus or extra-axial collection. No abnormal enhancement. Vascular: There is atherosclerotic calcification of the major vessels at the base of the brain. No abnormal enhancement. No abnormal vascular finding. Skull: Normal Sinuses/Orbits: Minimal mucosal thickening of the paranasal sinuses, possibly subclinical. Orbits negative. Other:  None IMPRESSION: No acute finding by CT. Minimal small vessel change of the hemispheric white matter. No abnormal contrast enhancement. Electronically Signed   By: Nelson Chimes M.D.   On: 07/06/2017 15:09    Procedures Procedures (including critical care time)  Medications Ordered in ED Medications  diltiazem (CARDIZEM) 100 mg in dextrose 5% 148mL (1 mg/mL) infusion (5 mg/hr Intravenous New Bag/Given 07/06/17 1506)  diltiazem (CARDIZEM) 1 mg/mL load via infusion 15 mg (15 mg Intravenous Bolus from Bag 07/06/17 1505)  iopamidol (ISOVUE-300) 61 % injection (75 mLs  Contrast Given 07/06/17 1451)     Initial Impression / Assessment and Plan / ED Course  I have reviewed the triage vital signs and the nursing notes.  Pertinent labs & imaging results that were available during my care of the patient were reviewed by me and considered in my medical decision making (see chart for details).   TIA. A symptomatically now. Normal neuro and NIH now. New A. Fib with symptoms that suggest recent paroxysmal A. Fib. Plan CT, CTA and 2. Discussed with neurology. If negative CT will initiate heparin and transferred to [facility] for neurology and cardial G consultation. Start on Cardizem bolus and drip. Heart rate has improved down to 103. Remains in A. Fib. Will not re-bolus.  Final Clinical Impressions(s) / ED Diagnoses   Final diagnoses:  Other specified transient cerebral ischemias  Atrial fibrillation with rapid ventricular response ([place])    CRITICAL CARE Performed by: Tanna Furry JOSEPH   Total critical care time: 30 minutes  Critical care time was exclusive of separately billable procedures and treating other patients.  Critical care was necessary to treat or prevent imminent or life-threatening deterioration.  Critical care was time spent personally by me on the following activities: development of treatment plan with patient and/or surrogate as well as nursing, discussions with consultants,  evaluation of patient's response to treatment, examination of patient, obtaining history from patient or surrogate, ordering and performing treatments and interventions, ordering and review of laboratory studies, ordering and review of radiographic studies, pulse oximetry and re-evaluation of patient's condition.   New Prescriptions New Prescriptions   No medications on file     Tanna Furry, MD 07/06/17 1513

## 2017-07-06 NOTE — ED Notes (Signed)
[facility] called for transport 

## 2017-07-07 ENCOUNTER — Encounter (HOSPITAL_COMMUNITY): Payer: Self-pay | Admitting: *Deleted

## 2017-07-07 ENCOUNTER — Inpatient Hospital Stay (HOSPITAL_COMMUNITY): Payer: Medicare Other

## 2017-07-07 DIAGNOSIS — G459 Transient cerebral ischemic attack, unspecified: Secondary | ICD-10-CM

## 2017-07-07 DIAGNOSIS — I10 Essential (primary) hypertension: Secondary | ICD-10-CM

## 2017-07-07 DIAGNOSIS — I340 Nonrheumatic mitral (valve) insufficiency: Secondary | ICD-10-CM

## 2017-07-07 DIAGNOSIS — E785 Hyperlipidemia, unspecified: Secondary | ICD-10-CM

## 2017-07-07 DIAGNOSIS — I4891 Unspecified atrial fibrillation: Secondary | ICD-10-CM

## 2017-07-07 DIAGNOSIS — G458 Other transient cerebral ischemic attacks and related syndromes: Secondary | ICD-10-CM

## 2017-07-07 DIAGNOSIS — E782 Mixed hyperlipidemia: Secondary | ICD-10-CM

## 2017-07-07 DIAGNOSIS — M3130 Wegener's granulomatosis without renal involvement: Secondary | ICD-10-CM

## 2017-07-07 DIAGNOSIS — G451 Carotid artery syndrome (hemispheric): Secondary | ICD-10-CM

## 2017-07-07 LAB — LIPID PANEL
Cholesterol: 192 mg/dL (ref 0–200)
HDL: 55 mg/dL (ref >40)
LDL Cholesterol: 124 mg/dL — ABNORMAL HIGH (ref 0–99)
Total CHOL/HDL Ratio: 3.5 RATIO
Triglycerides: 66 mg/dL (ref <150)
VLDL: 13 mg/dL (ref 0–40)

## 2017-07-07 LAB — CBC WITH DIFFERENTIAL/PLATELET
BASOS PCT: 1 %
Basophils Absolute: 0.1 10*3/uL (ref 0.0–0.1)
EOS ABS: 0.2 10*3/uL (ref 0.0–0.7)
Eosinophils Relative: 2 %
HCT: 41.6 % (ref 36.0–46.0)
HEMOGLOBIN: 13.6 g/dL (ref 12.0–15.0)
Lymphocytes Relative: 27 %
Lymphs Abs: 2.5 10*3/uL (ref 0.7–4.0)
MCH: 30.1 pg (ref 26.0–34.0)
MCHC: 32.7 g/dL (ref 30.0–36.0)
MCV: 92 fL (ref 78.0–100.0)
MONOS PCT: 6 %
Monocytes Absolute: 0.5 10*3/uL (ref 0.1–1.0)
NEUTROS PCT: 64 %
Neutro Abs: 6 10*3/uL (ref 1.7–7.7)
PLATELETS: 278 10*3/uL (ref 150–400)
RBC: 4.52 MIL/uL (ref 3.87–5.11)
RDW: 12.3 % (ref 11.5–15.5)
WBC: 9.3 10*3/uL (ref 4.0–10.5)

## 2017-07-07 LAB — BASIC METABOLIC PANEL
Anion gap: 10 (ref 5–15)
BUN: <5 mg/dL — ABNORMAL LOW (ref 6–20)
CALCIUM: 8.7 mg/dL — AB (ref 8.9–10.3)
CO2: 25 mmol/L (ref 22–32)
CREATININE: 0.72 mg/dL (ref 0.44–1.00)
Chloride: 104 mmol/L (ref 101–111)
GFR calc Af Amer: >60 mL/min (ref >60)
GFR calc non Af Amer: >60 mL/min (ref >60)
Glucose, Bld: 118 mg/dL — ABNORMAL HIGH (ref 65–99)
Potassium: 3.9 mmol/L (ref 3.5–5.1)
SODIUM: 139 mmol/L (ref 135–145)

## 2017-07-07 LAB — SEDIMENTATION RATE: SED RATE: 10 mm/h (ref 0–22)

## 2017-07-07 LAB — HEMOGLOBIN A1C
HEMOGLOBIN A1C: 5.7 % — AB (ref 4.8–5.6)
MEAN PLASMA GLUCOSE: 116.89 mg/dL

## 2017-07-07 LAB — TSH: TSH: 1.206 u[IU]/mL (ref 0.350–4.500)

## 2017-07-07 LAB — HEPARIN LEVEL (UNFRACTIONATED): Heparin Unfractionated: 0.7 IU/mL (ref 0.30–0.70)

## 2017-07-07 LAB — C-REACTIVE PROTEIN

## 2017-07-07 LAB — MRSA PCR SCREENING: MRSA BY PCR: NEGATIVE

## 2017-07-07 MED ORDER — LOSARTAN POTASSIUM 50 MG PO TABS
25.0000 mg | ORAL_TABLET | Freq: Every day | ORAL | Status: DC
  Administered 2017-07-08 – 2017-07-09 (×2): 25 mg via ORAL
  Filled 2017-07-07 (×2): qty 1

## 2017-07-07 MED ORDER — SCOPOLAMINE 1 MG/3DAYS TD PT72
1.0000 | MEDICATED_PATCH | TRANSDERMAL | Status: DC
  Administered 2017-07-07: 1.5 mg via TRANSDERMAL
  Filled 2017-07-07: qty 1

## 2017-07-07 MED ORDER — APIXABAN 5 MG PO TABS
5.0000 mg | ORAL_TABLET | Freq: Two times a day (BID) | ORAL | Status: DC
  Filled 2017-07-07: qty 1

## 2017-07-07 MED ORDER — APIXABAN 5 MG PO TABS
5.0000 mg | ORAL_TABLET | Freq: Two times a day (BID) | ORAL | Status: DC
  Administered 2017-07-07 – 2017-07-09 (×5): 5 mg via ORAL
  Filled 2017-07-07 (×4): qty 1

## 2017-07-07 MED ORDER — DILTIAZEM HCL ER COATED BEADS 120 MG PO CP24
120.0000 mg | ORAL_CAPSULE | Freq: Every day | ORAL | Status: DC
  Administered 2017-07-07 – 2017-07-08 (×2): 120 mg via ORAL
  Filled 2017-07-07: qty 1

## 2017-07-07 MED ORDER — DILTIAZEM HCL ER COATED BEADS 120 MG PO CP24
120.0000 mg | ORAL_CAPSULE | Freq: Every day | ORAL | Status: DC
  Filled 2017-07-07: qty 1

## 2017-07-07 MED ORDER — IOPAMIDOL (ISOVUE-370) INJECTION 76%
INTRAVENOUS | Status: AC
  Administered 2017-07-07: 50 mL
  Filled 2017-07-07: qty 50

## 2017-07-07 MED ORDER — KETOROLAC TROMETHAMINE 15 MG/ML IJ SOLN
15.0000 mg | Freq: Once | INTRAMUSCULAR | Status: AC
  Administered 2017-07-07: 15 mg via INTRAVENOUS
  Filled 2017-07-07: qty 1

## 2017-07-07 NOTE — Progress Notes (Signed)
  Echocardiogram 2D Echocardiogram has been performed.  Carolyn Vazquez T Brevon Dewald 07/07/2017, 5:06 PM

## 2017-07-07 NOTE — Consult Note (Signed)
Referring Physician: Dr. Rodena Piety    Chief Complaint: TIA  HPI: Carolyn Vazquez is an 76 y.o. female who presented for evaluation of nausea, headache, slurred speech, dysphagia and drooling. Dysphagia was so severe that she was unable to drink through a straw on Saturday, before she presented. Symptoms began at 10:30 AM on Saturday and continued to get worse prior to her decision to be seen in the ED. She was back to her baseline after arriving to the ED except for feeling "weak all over" as though "I could fall to the floor". She has no prior history of stroke or MI.   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 She states she has not had any issues with her Wegener's for a while and is not currently on any immunomodulatory medications; she is followed at [facility] for her Wegener's. She gets frequent headaches. PMHx also includes hypothyroidism, HTN, CKD and HLD. She was not on an antiplatelet agent or anticoagulation at home.   An echocardiogram in February 2016 revealed an EF of 55-60%. She was seen by her Cardiologist this week for possible atrial fibrillation. She also sees a Garment/textile technologist as an outpatient.   CT head and CTA head/neck in the ED showed no acute abnormality or vessel occlusion. Was newly diagnosed with atrial fibrillation in the ED. She was started on heparin gtt and transferred to [facility] for further evaluation. She was started on Cardizem gtt in the ED due to RVR in the setting of her atrial fibrillation.   Past Medical History:  Diagnosis Date  . Chronic kidney disease   . Hyperlipidemia   . Hypertension   . Thyroid disease     Past Surgical History:  Procedure Laterality Date  . ABDOMINAL HYSTERECTOMY  1979  . CHOLECYSTECTOMY    . 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 NASAL SINUS SURGERY      Family History  Problem Relation Age of Onset  .  Stroke Mother   . Thyroid disease Mother   . Stroke Father   . Alcoholism Father   . Asthma Sister   . Thyroid disease Sister   . Diabetes Brother   . Thyroid disease Sister   . Asthma Brother   . Heart attack Brother    Social History:  reports that she has never smoked. She has never used smokeless tobacco. She reports that she drinks alcohol. She reports that she does not use drugs.  Allergies:  Allergies  Allergen Reactions  . Codeine Anaphylaxis  . Atorvastatin Other (See Comments)    Depression   . Penicillins Rash    Happened in 1960s Has patient had a PCN reaction causing immediate rash, facial/tongue/throat swelling, SOB or lightheadedness with hypotension: Unknown Has patient had a PCN reaction causing severe rash involving mucus membranes or skin necrosis: Unknown Has patient had a PCN reaction that required hospitalization: Unknown Has patient had a PCN reaction occurring within the last 10 years: No If all of the above answers are "NO", then may proceed with Cephalosporin use.   . Simvastatin Other (See Comments)    JOINT PAIN    Medications:  Prior to Admission:  Prescriptions Prior to Admission  Medication Sig Dispense Refill Last Dose  . acetaminophen (TYLENOL) 325 MG tablet Take 325 mg by mouth daily as needed. Mild pain   07/06/2017 at 1300  .  amLODipine (NORVASC) 2.5 MG tablet Take 1 tablet (2.5 mg total) by mouth daily. (Patient taking differently: Take 2.5 mg by mouth every evening. ) 90 tablet 6 07/05/2017 at Unknown time  . ascorbic acid (VITAMIN C) 1000 MG tablet Take 1,000 mg by mouth daily.   07/06/2017 at Unknown time  . aspirin 81 MG tablet Take 81 mg by mouth daily.   07/06/2017 at Unknown time  . B Complex Vitamins (B-COMPLEX/B-12) TABS Take 1 tablet by mouth daily.    07/06/2017 at Unknown time  . Calcium Carbonate Antacid (TUMS PO) Take 1 tablet by mouth daily.   07/05/2017 at Unknown time  . Cholecalciferol (VITAMIN D3) 1000 UNITS CAPS Take 2,000  mg by mouth daily.   07/06/2017 at Unknown time  . fexofenadine (ALLEGRA) 180 MG tablet Take 180 mg by mouth daily.   07/06/2017 at Unknown time  . furosemide (LASIX) 20 MG tablet Take 1 tablet (20 mg total) by mouth daily. 90 tablet 6 07/06/2017 at Unknown time  . levothyroxine (SYNTHROID) 100 MCG tablet Take 1 tablet (100 mcg total) by mouth daily before breakfast.   07/06/2017 at Unknown time  . losartan (COZAAR) 100 MG tablet Take 100 mg by mouth every morning.   07/06/2017 at Unknown time  . Multiple Vitamins-Minerals (ICAPS) CAPS Take 1 tablet by mouth 2 (two) times daily.   07/06/2017 at Unknown time   Scheduled: . amLODipine  2.5 mg Oral Daily  . aspirin EC  81 mg Oral Daily  . B-complex with vitamin C  1 tablet Oral Daily  . cholecalciferol  2,000 Units Oral Daily  . levothyroxine  100 mcg Oral QAC breakfast  . losartan  100 mg Oral Daily  . scopolamine  1 patch Transdermal Q72H    ROS: 3-4/10 headache across her forehead radiating to the right temporal region. Other ROS as per HPI.   Physical Examination: Blood pressure 120/77, pulse 96, temperature 97.7 F (36.5 C), temperature source Axillary, resp. rate 16, height 5' 1.5" (1.562 m), weight 61.2 kg (135 lb), SpO2 95 %.  HEENT: /AT Lungs: Respirations unlabored Ext: No edema  Neurologic Examination: Mental Status: Alert, oriented, thought content appropriate.  Speech fluent without evidence of aphasia.  Able to follow all commands without difficulty. Cranial Nerves: II:  Visual fields intact, PERRL III,IV, VI: ptosis not present, EOMI without nystagmus V,VII: smile symmetric, facial temp sensation normal bilaterally VIII: hearing intact to conversation IX,X: no hypophonia XI: Symmetric XII: midline tongue extension  Motor: Right : Upper extremity   5/5    Left:     Upper extremity   5/5  Lower extremity   5/5     Lower extremity   5/5 Tone and bulk: Normal tone throughout; no atrophy noted Sensory: Temp and light touch  intact throughout, bilaterally. No extinction. Deep Tendon Reflexes:  2+ bilateral upper extremities and patellae. Trace achilles bilaterally.  Plantars: Equivocal bilaterally Cerebellar: No ataxia with FNF bilaterally Gait: Deferred  Results for orders placed or performed during the hospital encounter of 07/06/17 (from the past 48 hour(s))  CBC with Differential/Platelet     Status: None   Collection Time: 07/06/17  2:07 PM  Result Value Ref Range   WBC 8.8 4.0 - 10.5 K/uL   RBC 4.64 3.87 - 5.11 MIL/uL   Hemoglobin 14.8 12.0 - 15.0 g/dL   HCT 42.5 36.0 - 46.0 %   MCV 91.6 78.0 - 100.0 fL   MCH 31.9 26.0 - 34.0 pg   MCHC  34.8 30.0 - 36.0 g/dL   RDW 12.4 11.5 - 15.5 %   Platelets 292 150 - 400 K/uL   Neutrophils Relative % 56 %   Neutro Abs 5.0 1.7 - 7.7 K/uL   Lymphocytes Relative 31 %   Lymphs Abs 2.7 0.7 - 4.0 K/uL   Monocytes Relative 7 %   Monocytes Absolute 0.6 0.1 - 1.0 K/uL   Eosinophils Relative 5 %   Eosinophils Absolute 0.4 0.0 - 0.7 K/uL   Basophils Relative 1 %   Basophils Absolute 0.1 0.0 - 0.1 K/uL  Comprehensive metabolic panel     Status: Abnormal   Collection Time: 07/06/17  2:07 PM  Result Value Ref Range   Sodium 142 135 - 145 mmol/L   Potassium 3.6 3.5 - 5.1 mmol/L   Chloride 104 101 - 111 mmol/L   CO2 26 22 - 32 mmol/L   Glucose, Bld 148 (H) 65 - 99 mg/dL   BUN 10 6 - 20 mg/dL   Creatinine, Ser 0.81 0.44 - 1.00 mg/dL   Calcium 9.8 8.9 - 10.3 mg/dL   Total Protein 7.7 6.5 - 8.1 g/dL   Albumin 4.3 3.5 - 5.0 g/dL   AST 33 15 - 41 U/L   ALT 23 14 - 54 U/L   Alkaline Phosphatase 94 38 - 126 U/L   Total Bilirubin 0.9 0.3 - 1.2 mg/dL   GFR calc non Af Amer >60 >60 mL/min   GFR calc Af Amer >60 >60 mL/min    Comment: (NOTE) The eGFR has been calculated using the CKD EPI equation. This calculation has not been validated in all clinical situations. eGFR's persistently <60 mL/min signify possible Chronic Kidney Disease.    Anion gap 12 5 - 15  I-stat  chem 8, ed     Status: Abnormal   Collection Time: 07/06/17  2:33 PM  Result Value Ref Range   Sodium 142 135 - 145 mmol/L   Potassium 3.6 3.5 - 5.1 mmol/L   Chloride 103 101 - 111 mmol/L   BUN 9 6 - 20 mg/dL   Creatinine, Ser 0.90 0.44 - 1.00 mg/dL   Glucose, Bld 148 (H) 65 - 99 mg/dL   Calcium, Ion 1.18 1.15 - 1.40 mmol/L   TCO2 28 0 - 100 mmol/L   Hemoglobin 15.0 12.0 - 15.0 g/dL   HCT 44.0 36.0 - 46.0 %  MRSA PCR Screening     Status: None   Collection Time: 07/06/17 10:07 PM  Result Value Ref Range   MRSA by PCR NEGATIVE NEGATIVE    Comment:        The GeneXpert MRSA Assay (FDA approved for NASAL specimens only), is one component of a comprehensive MRSA colonization surveillance program. It is not intended to diagnose MRSA infection nor to guide or monitor treatment for MRSA infections.    Ct Head W & Wo Contrast  Result Date: 07/06/2017 CLINICAL DATA:  Sudden onset of severe headache. Recent diagnosis of atrial fibrillation. EXAM: CT HEAD WITHOUT AND WITH CONTRAST TECHNIQUE: Contiguous axial images were obtained from the base of the skull through the vertex without and with intravenous contrast CONTRAST:  27m ISOVUE-300 IOPAMIDOL (ISOVUE-300) INJECTION 61% COMPARISON:  None. FINDINGS: Brain: Mild age related volume loss. The brainstem and cerebellum appear normal. Cerebral hemispheres show minimal areas of low-density in the deep white matter consistent with mild small vessel change. No sign of acute infarction, mass lesion, hemorrhage, hydrocephalus or extra-axial collection. No abnormal enhancement. Vascular: There is atherosclerotic  calcification of the major vessels at the base of the brain. No abnormal enhancement. No abnormal vascular finding. Skull: Normal Sinuses/Orbits: Minimal mucosal thickening of the paranasal sinuses, possibly subclinical. Orbits negative. Other: None IMPRESSION: No acute finding by CT. Minimal small vessel change of the hemispheric white matter. No  abnormal contrast enhancement. Electronically Signed   By: Nelson Chimes M.D.   On: 07/06/2017 15:09    Assessment: 76 y.o. female with a history of Wegener's granulomatosis, newly diagnosed atrial fibrillation, presents with TIA manifesting with headache and slurred speech with dysphagia 1. CT head reveals no acute abnormality. Minimal small vessel changes of the hemispheric white matter. No abnormal contrast enhancement. 2. DDx for underlying etiology of her TIA includes vasculitis (reactivation of Wegener's, now with CNS manifestations) and atrial fibrillation. 3. Stroke Risk Factors - atrial fibrillation, Wegener's granulomatosis, HTN and HLD  Plan: 1. Continue heparin gtt. Will need to be transitioned to an oral anticoagulant. Coumadin is the best option as patient states that she is on a limited budget.  2. MRI of the brain with contrast 3. MRA head  4. TTE. If negative will need TEE  5. Carotid dopplers 6. PT consult, OT consult, Speech consult 7. Risk factor modification 8. Telemetry monitoring 9. Frequent neuro checks 10. HgbA1c, fasting lipid panel 11. Cannot start a statin due to statin allergy 12. BP management.    _0  signed: Dr. Kerney Elbe  07/07/2017, 3:51 AM

## 2017-07-07 NOTE — Progress Notes (Signed)
PT Cancellation Note  Patient Details Name: Carolyn Vazquez MRN: [id number] DOB: [birth year]   Cancelled Treatment:    Reason Eval/Treat Not Completed: Medical issues which prohibited therapy Pt on bedrest. Will await increase in activity orders prior to PT evaluation.   Marguarite Arbour A [name] 07/07/2017, 7:02 AM [name], PT, DPT [phone]

## 2017-07-07 NOTE — Progress Notes (Signed)
Patient continues to complain of headache 8/10 and nausea. Nausea improved with phenergan, patient is now drowsy, but rouses easily. Headache unchanged after tylenol, morphine, and dilaudid. Headache increased when standing. Pt states her headache moves from the front to the right side of her head. HR elevated to 190s after getting up to [facility] and vomiting. Tremors developed on [facility] and went away when patient got back to bed. Patient now states she feels like her "body is in motion." [place], NP notified and interventions ordered.

## 2017-07-07 NOTE — Evaluation (Signed)
Physical Therapy Evaluation Patient Details Name: Carolyn Vazquez MRN: [id number] DOB: 1941-06-25 Today's Date: 07/07/2017   History of Present Illness  Patient is Carolyn 76 y/o female who presents with nausea, headache, slurred speech, dysphagia and drooling. Head CT-unremarkable. Found to have Carolyn-fib with RVR in ED. PMH includes HTN, CKD, wegnersgranulomatosis,  Clinical Impression  Patient presents with overall generalized weakness, dizziness and elevated HR (Carolyn-fib) s/p above. Pt independent PTA and driving. Tolerated gait training and ADLs with Min guard assist for balance/managing lines. Pt has support from daughter and grandchild at home. HR up to 127 bpm Carolyn-fib. Other VSS. Pt may progress enough to not need HHPT. Will follow acutely to maximize independence and mobility prior to return home.     Follow Up Recommendations Home health PT;Supervision for mobility/OOB    Equipment Recommendations  None recommended by PT    Recommendations for Other Services OT consult     Precautions / Restrictions Precautions Precautions: Fall Precaution Comments: watch HR Restrictions Weight Bearing Restrictions: No      Mobility  Bed Mobility Overal bed mobility: Modified Independent             General bed mobility comments: Use of rail and HOB elevated.  Transfers Overall transfer level: Needs assistance Equipment used: None Transfers: Sit to/from Stand Sit to Stand: Min guard         General transfer comment: Min guard for safety. Stood from [employer], from toilet x1.   Ambulation/Gait Ambulation/Gait assistance: Min guard Ambulation Distance (Feet): 40 Feet Assistive device: None Gait Pattern/deviations: Step-through pattern;Decreased stride length Gait velocity: decreased Gait velocity interpretation: <1.8 ft/sec, indicative of risk for recurrent falls General Gait Details: Slow, mildly unsteady gait. HR up to 127 bpm Carolyn-fib. No LOB. + dizzines. BP stable.  Stairs            Wheelchair Mobility    Modified Rankin (Stroke Patients Only) Modified Rankin (Stroke Patients Only) Pre-Morbid Rankin Score: No symptoms Modified Rankin: No significant disability     Balance Overall balance assessment: Needs assistance Sitting-balance support: Feet supported;No upper extremity supported Sitting balance-Carolyn Vazquez: Good     Standing balance support: During functional activity Standing balance-Carolyn Vazquez: Fair Standing balance comment: Able to stand at sink with SLS stepping down on pedals to wash hands without difficulty. Requires at least 1 UE support.                             Pertinent Vitals/Pain Pain Assessment: No/denies pain    Home Living Family/patient expects to be discharged to:: Private residence Living Arrangements: Alone;Other (Comment) (granddaughter and daughter) Available Help at Discharge: Family;Available PRN/intermittently Type of Home: House Home Access: Stairs to enter   Entrance Stairs-Number of Steps: 2 Home Layout: Two level;Able to live on main level with bedroom/bathroom Home Equipment: Carolyn Vazquez - single point      Prior Function Level of Independence: Independent         Comments: Drives.     Hand Dominance        Extremity/Trunk Assessment   Upper Extremity Assessment Upper Extremity Assessment: Defer to OT evaluation    Lower Extremity Assessment Lower Extremity Assessment: Generalized weakness (Sensation [facility]       Communication   Communication: No difficulties  Cognition Arousal/Alertness: Lethargic (given medicine prior to PT arrival) Behavior During Therapy: WFL for tasks assessed/performed Overall Cognitive Status: Within Functional Limits for tasks assessed  General Comments General comments (skin integrity, edema, etc.): HR ranged from 80-127 bpm.    Exercises     Assessment/Plan    PT Assessment Patient needs  continued PT services  PT Problem List Decreased mobility;Decreased balance;Cardiopulmonary status limiting activity;Decreased activity tolerance;Decreased strength       PT Treatment Interventions Therapeutic activities;Therapeutic exercise;Gait training;Patient/family education;Balance training;Functional mobility training;Stair training;Neuromuscular re-education    PT Goals (Current goals can be found in the Care Plan section)  Acute Rehab PT Goals Patient Stated Goal: to feel better PT Goal Formulation: With patient Time For Goal Achievement: 07/21/17 Potential to Achieve Goals: Good    Frequency Min 4X/week   Barriers to discharge        Co-evaluation               AM-PAC PT "6 Clicks" Daily Activity  Outcome Measure Difficulty turning over in bed (including adjusting bedclothes, sheets and blankets)?: None Difficulty moving from lying on back to sitting on the side of the bed? : None Difficulty sitting down on and standing up from Carolyn chair with arms (e.g., wheelchair, bedside commode, etc,.)?: None Help needed moving to and from Carolyn bed to chair (including Carolyn wheelchair)?: Carolyn Little Help needed walking in hospital room?: Carolyn Little Help needed climbing 3-5 steps with Carolyn railing? : Carolyn Little 6 Click Score: 21    End of Session Equipment Utilized During Treatment: Gait belt Activity Tolerance: Patient tolerated treatment well;Other (comment) (dizziness, sleepiness) Patient left: in bed;with call bell/phone within reach;with bed alarm set Nurse Communication: Mobility status PT Visit Diagnosis: Unsteadiness on feet (R26.81);Difficulty in walking, not elsewhere classified (R26.2)    Time: 0865-7846 PT Time Calculation (min) (ACUTE ONLY): 22 min   Charges:   PT Evaluation $PT Eval Moderate Complexity: 1 Mod     PT G CodesWray Vazquez, PT, DPT [phone]    Carolyn Vazquez Carolyn Vazquez 07/07/2017, 12:09 PM

## 2017-07-07 NOTE — Progress Notes (Signed)
ANTICOAGULATION CONSULT NOTE - Follow Up Consult  Pharmacy Consult for Heparin  Indication: atrial fibrillation  Patient Measurements: Height: 5' 1.5" (156.2 cm) Weight: 135 lb (61.2 kg) IBW/kg (Calculated) : 48.95  Vital Signs: Temp: 97.8 F (36.6 C) (08/19 0400) Temp Source: Oral (08/19 0400) BP: 103/74 (08/19 0400) Pulse Rate: 101 (08/19 0400)  Labs:  Recent Labs  07/06/17 1407 07/06/17 1433 07/07/17 0413  HGB 14.8 15.0 13.6  HCT 42.5 44.0 41.6  PLT 292  --  278  HEPARINUNFRC  --   --  0.70  CREATININE 0.81 0.90  --     Estimated Creatinine Clearance: 44.5 mL/min (by C-G formula based on SCr of 0.9 mg/dL).   Assessment: 76 y/o F transfer from [facility] on heparin at 1000 units/hr for afib. Also undergoing stroke work-up (CT negative). CBC good. Initial heparin level is supra-therapeutic at 0.7 (goal 0.3-0.5 during stroke/TIA work-up).   Goal of Therapy:  Heparin level 0.3-0.5 units/ml, while undergoing stroke work-up Monitor platelets by anticoagulation protocol: Yes   Plan:  -Dec heparin to 900 units/hr -1300 HL  Narda Bonds 07/07/2017,5:12 AM

## 2017-07-07 NOTE — Progress Notes (Signed)
Patient had a better day.  This morning patient did experience brief moment  a headache after ambulating to the bathroom.  Headache was relief with meds and rest.  Patient had CT test done, Heparin stopped, and [place]quist started.

## 2017-07-07 NOTE — Progress Notes (Signed)
ANTICOAGULATION CONSULT NOTE - Follow Up Consult  Pharmacy Consult for Heparin >> apixaban  Indication: atrial fibrillation TIA  Patient Measurements: Height: 5' 1.5" (156.2 cm) Weight: 135 lb (61.2 kg) IBW/kg (Calculated) : 48.95  Vital Signs: Temp: 97.8 F (36.6 C) (08/19 1120) Temp Source: Oral (08/19 1120) BP: 112/81 (08/19 1120) Pulse Rate: 91 (08/19 1120)  Labs:  Recent Labs  07/06/17 1407 07/06/17 1433 07/07/17 0413  HGB 14.8 15.0 13.6  HCT 42.5 44.0 41.6  PLT 292  --  278  HEPARINUNFRC  --   --  0.70  CREATININE 0.81 0.90 0.72    Estimated Creatinine Clearance: 50.1 mL/min (by C-G formula based on SCr of 0.72 mg/dL).   Assessment: 76 y/o female admitted with atrial fibrillation currently being managed with heparin gtt to transition apixaban for further treatment per cardiology. Per notes feel that pt has low risk of hemorraghic conversion.   Goal of Therapy:  Monitor platelets by anticoagulation protocol: Yes   Plan:  - Discontinue heparin infusion - Begin Apixaban 5 mg BID - Follow up neurology workup    Vincenza Hews, PharmD, BCPS 07/07/2017, 12:18 PM

## 2017-07-07 NOTE — Progress Notes (Signed)
PROGRESS NOTE    Carolyn Vazquez  TDH:[id number] DOB: Mar 31, 1941 DOA: 07/06/2017 PCP: [name], MD   Brief Narrative:  Patient is a 76 year old with history of hypothyroidism, hypertension, CAD, Wegener's Granulomatosis, who presented with complaints of slurred speech.   Assessment & Plan:   Active Problems:   TIA (transient ischemic attack) -  Neurology on board and patient currently undergoing TIA workup.    Atrial fibrillation with rapid ventricular response [facility]) - Cardiology consulted and managing. - Plan is to anticoagulate with eliquis   DVT prophylaxis: pt was on heparin and will be placed on eliquis Code Status: Full Family Communication: d/c patient directly Disposition Plan: pending work up   Consultants:   Cardiology  Neurology   Procedures:stroke w/u   Antimicrobials: none   Subjective: Pt has no new complaints  Objective: Vitals:   07/07/17 0709 07/07/17 1034 07/07/17 1120 07/07/17 1551  BP: 112/83 128/86 112/81 119/86  Pulse: 92  91   Resp: 16  (!) 21   Temp: 98.1 F (36.7 C)  97.8 F (36.6 C)   TempSrc: Oral  Oral   SpO2: 95%     Weight:      Height:        Intake/Output Summary (Last 24 hours) at 07/07/17 1653 Last data filed at 07/07/17 0517  Gross per 24 hour  Intake           380.48 ml  Output              400 ml  Net           -19.52 ml   Filed Weights   07/06/17 1413 07/06/17 2145  Weight: 68 kg (150 lb) 61.2 kg (135 lb)    Examination:  General exam: Appears calm and comfortable, in nad Respiratory system: Clear to auscultation. Respiratory effort normal. Cardiovascular system: S1 & S2 heard, RRR. No JVD, murmurs, rubs, gallops or clicks. No pedal edema. Gastrointestinal system: Abdomen is nondistended, soft and nontender. No organomegaly or masses felt. Normal bowel sounds heard. Central nervous system: Alert and oriented. No facial asymmetry, moves extremity equally  Extremities: Symmetric 5 x 5  power. Skin: No rashes, lesions or ulcers, on limited exam. Psychiatry: Judgement and insight appear normal. Mood & affect appropriate.     Data Reviewed: I have personally reviewed following labs and imaging studies  CBC:  Recent Labs Lab 07/06/17 1407 07/06/17 1433 07/07/17 0413  WBC 8.8  --  9.3  NEUTROABS 5.0  --  6.0  HGB 14.8 15.0 13.6  HCT 42.5 44.0 41.6  MCV 91.6  --  92.0  PLT 292  --  646   Basic Metabolic Panel:  Recent Labs Lab 07/06/17 1407 07/06/17 1433 07/07/17 0413  NA 142 142 139  K 3.6 3.6 3.9  CL 104 103 104  CO2 26  --  25  GLUCOSE 148* 148* 118*  BUN 10 9 <5*  CREATININE 0.81 0.90 0.72  CALCIUM 9.8  --  8.7*   GFR: Estimated Creatinine Clearance: 50.1 mL/min (by C-G formula based on SCr of 0.72 mg/dL). Liver Function Tests:  Recent Labs Lab 07/06/17 1407  AST 33  ALT 23  ALKPHOS 94  BILITOT 0.9  PROT 7.7  ALBUMIN 4.3   No results for input(s): LIPASE, AMYLASE in the last 168 hours. No results for input(s): AMMONIA in the last 168 hours. Coagulation Profile: No results for input(s): INR, PROTIME in the last 168 hours. Cardiac Enzymes: No results for input(s):  CKTOTAL, CKMB, CKMBINDEX, TROPONINI in the last 168 hours. BNP (last 3 results) No results for input(s): PROBNP in the last 8760 hours. HbA1C:  Recent Labs  07/07/17 0413  HGBA1C 5.7*   CBG: No results for input(s): GLUCAP in the last 168 hours. Lipid Profile:  Recent Labs  07/07/17 0413  CHOL 192  HDL 55  LDLCALC 124*  TRIG 66  CHOLHDL 3.5   Thyroid Function Tests:  Recent Labs  07/07/17 0413  TSH 1.206   Anemia Panel: No results for input(s): VITAMINB12, FOLATE, FERRITIN, TIBC, IRON, RETICCTPCT in the last 72 hours. Sepsis Labs: No results for input(s): PROCALCITON, LATICACIDVEN in the last 168 hours.  Recent Results (from the past 240 hour(s))  MRSA PCR Screening     Status: None   Collection Time: 07/06/17 10:07 PM  Result Value Ref Range  Status   MRSA by PCR NEGATIVE NEGATIVE Final    Comment:        The GeneXpert MRSA Assay (FDA approved for NASAL specimens only), is one component of a comprehensive MRSA colonization surveillance program. It is not intended to diagnose MRSA infection nor to guide or monitor treatment for MRSA infections.          Radiology Studies: Ct Head W & Wo Contrast  Result Date: 07/06/2017 CLINICAL DATA:  Sudden onset of severe headache. Recent diagnosis of atrial fibrillation. EXAM: CT HEAD WITHOUT AND WITH CONTRAST TECHNIQUE: Contiguous axial images were obtained from the base of the skull through the vertex without and with intravenous contrast CONTRAST:  31mL ISOVUE-300 IOPAMIDOL (ISOVUE-300) INJECTION 61% COMPARISON:  None. FINDINGS: Brain: Mild age related volume loss. The brainstem and cerebellum appear normal. Cerebral hemispheres show minimal areas of low-density in the deep white matter consistent with mild small vessel change. No sign of acute infarction, mass lesion, hemorrhage, hydrocephalus or extra-axial collection. No abnormal enhancement. Vascular: There is atherosclerotic calcification of the major vessels at the base of the brain. No abnormal enhancement. No abnormal vascular finding. Skull: Normal Sinuses/Orbits: Minimal mucosal thickening of the paranasal sinuses, possibly subclinical. Orbits negative. Other: None IMPRESSION: No acute finding by CT. Minimal small vessel change of the hemispheric white matter. No abnormal contrast enhancement. Electronically Signed   By: Nelson Chimes M.D.   On: 07/06/2017 15:09        Scheduled Meds: . iopamidol      . apixaban  5 mg Oral BID  . B-complex with vitamin C  1 tablet Oral Daily  . cholecalciferol  2,000 Units Oral Daily  . diltiazem  120 mg Oral Daily  . levothyroxine  100 mcg Oral QAC breakfast  . [START ON 07/08/2017] losartan  25 mg Oral Daily  . scopolamine  1 patch Transdermal Q72H   Continuous Infusions:   LOS: 1  day    Time spent: > 35 minutes  Velvet Bathe, MD [facility] Pager [phone]  If 7PM-7AM, please contact night-coverage [email or web address] Password [email or web address] 07/07/2017, 4:53 PM

## 2017-07-07 NOTE — Consult Note (Signed)
Cardiology Consultation:   Patient ID: Carolyn Vazquez; [id number]; [birth year]   Admit date: 07/06/2017 Date of Consult: 07/07/2017  Primary Care Provider: [name], MD Primary Cardiologist: Dr. Meda Coffee Primary Electrophysiologist:  none   Patient Profile:   Carolyn Vazquez is a 76 y.o. female with a hx of AFIb who is being seen today for the evaluation of AFIB at the request of [place].  History of Present Illness:   Carolyn Vazquez is 76 years old with newly diagnosed atrial fibrillation admitted on 07/06/17 with transient ischemic attack, nausea, headache, slurred speech, dysphagia, drooling, could not drink with a straw for instance. Carolyn Vazquez began Saturday morning. She now feels weak but is back to baseline.  Recently saw Dr. Rhona Raider, EF 60% on echocardiogram. While in the emergency room, atrial fibrillation was discovered. There was suspicion of atrial fibrillation at prior cardiology visits. This was never captured. She was started on heparin drip and Cardizem drip. Her atrial fibrillation was rapid at the time.  She has also been followed at [facility] for [employer]. In the past has been on prednisone as well as CellCept. She was diagnosed with Wegener's after having hemoptysis. She has had trouble tolerating statins (simvastatin) in the past.  Denies CP, SOB, fever, chills, recent bleeding.   Past Medical History:  Diagnosis Date  . Chronic kidney disease   . Hyperlipidemia   . Hypertension   . Thyroid disease     Past Surgical History:  Procedure Laterality Date  . ABDOMINAL HYSTERECTOMY  1979  . CHOLECYSTECTOMY    . 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 NASAL SINUS SURGERY       Home Medications:  Prior to Admission medications   Medication Sig Start Date End Date Taking? Authorizing Provider  acetaminophen (TYLENOL) 325 MG tablet Take 325 mg by mouth daily as needed. Mild pain   Yes [provider]  amLODipine (NORVASC) 2.5 MG tablet Take 1 tablet (2.5  mg total) by mouth daily. Patient taking differently: Take 2.5 mg by mouth every evening.  08/24/16  Yes [name], MD  ascorbic acid (VITAMIN C) 1000 MG tablet Take 1,000 mg by mouth daily.   Yes [provider]  aspirin 81 MG tablet Take 81 mg by mouth daily. 05/10/11  Yes [provider]  B Complex Vitamins (B-COMPLEX/B-12) TABS Take 1 tablet by mouth daily.  01/29/13  Yes [provider]  Calcium Carbonate Antacid (TUMS PO) Take 1 tablet by mouth daily.   Yes [provider]  Cholecalciferol (VITAMIN D3) 1000 UNITS CAPS Take 2,000 mg by mouth daily. 01/29/13  Yes [provider]  fexofenadine (ALLEGRA) 180 MG tablet Take 180 mg by mouth daily. 03/02/10  Yes [provider]  furosemide (LASIX) 20 MG tablet Take 1 tablet (20 mg total) by mouth daily. 08/24/16  Yes [name], MD  levothyroxine (SYNTHROID) 100 MCG tablet Take 1 tablet (100 mcg total) by mouth daily before breakfast. 12/20/16  Yes [name], MD  losartan (COZAAR) 100 MG tablet Take 100 mg by mouth every morning.   Yes [provider]  Multiple Vitamins-Minerals (ICAPS) CAPS Take 1 tablet by mouth 2 (two) times daily.   Yes [provider]    Inpatient Medications: Scheduled Meds: . amLODipine  2.5 mg Oral Daily  . aspirin EC  81 mg Oral Daily  . B-complex with vitamin C  1 tablet Oral Daily  . cholecalciferol  2,000 Units Oral Daily  .  levothyroxine  100 mcg Oral QAC breakfast  . losartan  100 mg Oral Daily  . scopolamine  1 patch Transdermal Q72H   Continuous Infusions: . heparin 900 Units/hr (07/07/17 0517)   PRN Meds: acetaminophen **OR** acetaminophen (TYLENOL) oral liquid 160 mg/5 mL **OR** acetaminophen, acetaminophen, promethazine  Allergies:    Allergies  Allergen Reactions  . Codeine Anaphylaxis  . Atorvastatin Other (See Comments)    Depression   . Penicillins Rash    Happened in 1960s Has patient had a PCN  reaction causing immediate rash, facial/tongue/throat swelling, SOB or lightheadedness with hypotension: Unknown Has patient had a PCN reaction causing severe rash involving mucus membranes or skin necrosis: Unknown Has patient had a PCN reaction that required hospitalization: Unknown Has patient had a PCN reaction occurring within the last 10 years: No If all of the above answers are "NO", then may proceed with Cephalosporin use.   . Simvastatin Other (See Comments)    JOINT PAIN    Social History:   Social History   Social History  . Marital status: Widowed    Spouse name: N/A  . Number of children: N/A  . Years of education: N/A   Occupational History  . Not on file.   Social History Main Topics  . Smoking status: Never Smoker  . Smokeless tobacco: Never Used  . Alcohol use 0.0 oz/week     Comment: every day glass of wine  . Drug use: No  . Sexual activity: Not on file   Other Topics Concern  . Not on file   Social History Narrative  . No narrative on file    Family History:    Family History  Problem Relation Age of Onset  . Stroke Mother   . Thyroid disease Mother   . Stroke Father   . Alcoholism Father   . Asthma Sister   . Thyroid disease Sister   . Diabetes Brother   . Thyroid disease Sister   . Asthma Brother   . Heart attack Brother      ROS:  Please see the history of present illness.  ROS  Other than strokelike symptoms as described above, All other ROS reviewed and negative.     Physical Exam/Data:   Vitals:   07/07/17 0400 07/07/17 0709 07/07/17 1034 07/07/17 1120  BP: 103/74 112/83 128/86 112/81  Pulse: (!) 101 92  91  Resp: 16 16  (!) 21  Temp: 97.8 F (36.6 C) 98.1 F (36.7 C)  97.8 F (36.6 C)  TempSrc: Oral Oral  Oral  SpO2: 95% 95%    Weight:      Height:        Intake/Output Summary (Last 24 hours) at 07/07/17 1127 Last data filed at 07/07/17 0517  Gross per 24 hour  Intake           380.48 ml  Output              400  ml  Net           -19.52 ml   Filed Weights   07/06/17 1413 07/06/17 2145  Weight: 150 lb (68 kg) 135 lb (61.2 kg)   Body mass index is 25.09 kg/m.  General:  Well nourished, well developed, in no acute distress HEENT: normal Lymph: no adenopathy Neck: no JVD Endocrine:  No thryomegaly Vascular: No carotid bruits; FA pulses 2+ bilaterally without bruits  Cardiac:  normal S1, S2; RRR; no murmur  Lungs:  clear to auscultation  bilaterally, no wheezing, rhonchi or rales  Abd: soft, nontender, no hepatomegaly  Ext: no edema Musculoskeletal:  No deformities, BUE and BLE strength normal and equal Skin: warm and dry  Neuro:  CNs 2-12 intact, no focal abnormalities noted Psych:  Normal affect   EKG:  The EKG was personally reviewed and demonstrates:  Atrial fibrillation heart rate 145 bpm with nonspecific ST-T wave changes. Telemetry:  Telemetry was personally reviewed and demonstrates:  AFIB now 90's  Relevant CV Studies:  Echocardiogram 12/24/14:  - Normal LV size and systolic function, EF 70-96%. Moderate diastolic dysfunction. Normal RV size and systolic function. Moderate left atrial enlargement. No significant valvular abnormalities.   Laboratory Data:  Chemistry Recent Labs Lab 07/06/17 1407 07/06/17 1433 07/07/17 0413  NA 142 142 139  K 3.6 3.6 3.9  CL 104 103 104  CO2 26  --  25  GLUCOSE 148* 148* 118*  BUN 10 9 <5*  CREATININE 0.81 0.90 0.72  CALCIUM 9.8  --  8.7*  GFRNONAA >60  --  >60  GFRAA >60  --  >60  ANIONGAP 12  --  10     Recent Labs Lab 07/06/17 1407  PROT 7.7  ALBUMIN 4.3  AST 33  ALT 23  ALKPHOS 94  BILITOT 0.9   Hematology Recent Labs Lab 07/06/17 1407 07/06/17 1433 07/07/17 0413  WBC 8.8  --  9.3  RBC 4.64  --  4.52  HGB 14.8 15.0 13.6  HCT 42.5 44.0 41.6  MCV 91.6  --  92.0  MCH 31.9  --  30.1  MCHC 34.8  --  32.7  RDW 12.4  --  12.3  PLT 292  --  278   Cardiac EnzymesNo results for input(s): TROPONINI in the last  168 hours. No results for input(s): TROPIPOC in the last 168 hours.  BNPNo results for input(s): BNP, PROBNP in the last 168 hours.  DDimer No results for input(s): DDIMER in the last 168 hours.  Radiology/Studies:  Ct Head W & Wo Contrast  Result Date: 07/06/2017 CLINICAL DATA:  Sudden onset of severe headache. Recent diagnosis of atrial fibrillation. EXAM: CT HEAD WITHOUT AND WITH CONTRAST TECHNIQUE: Contiguous axial images were obtained from the base of the skull through the vertex without and with intravenous contrast CONTRAST:  23mL ISOVUE-300 IOPAMIDOL (ISOVUE-300) INJECTION 61% COMPARISON:  None. FINDINGS: Brain: Mild age related volume loss. The brainstem and cerebellum appear normal. Cerebral hemispheres show minimal areas of low-density in the deep white matter consistent with mild small vessel change. No sign of acute infarction, mass lesion, hemorrhage, hydrocephalus or extra-axial collection. No abnormal enhancement. Vascular: There is atherosclerotic calcification of the major vessels at the base of the brain. No abnormal enhancement. No abnormal vascular finding. Skull: Normal Sinuses/Orbits: Minimal mucosal thickening of the paranasal sinuses, possibly subclinical. Orbits negative. Other: None IMPRESSION: No acute finding by CT. Minimal small vessel change of the hemispheric white matter. No abnormal contrast enhancement. Electronically Signed   By: Nelson Chimes M.D.   On: 07/06/2017 15:09    Assessment and Plan:   Paroxysmal atrial fibrillation with setting of TIA  - Currently on IV heparin  - Recommend starting Eliquis 5 mg twice a day if okay with neurology. Will order per pham. She does not seem to have any large stroke territory so would have decreased risk of hemorrhagic transformation. Will need to stop IV hep.   - Diltiazem for rate control. We will discontinue amlodipine.  - CHA2DS2VASc score is  4.    - ECHO P  - Dilt IV off  - will stop amlodipine 2.5 and start dilt 120  CD QD for rate control  - Decreasing losartan from 100 to 25 to allow increased BP in the setting of TIA  Wegener's  - Stable. No longer on prednisone or CellCept. [facility] has followed in the past.  TIA  - Back to baseline.  - Neurology note reviewed.  Signed, Candee Furbish, MD  07/07/2017 11:27 AM

## 2017-07-07 NOTE — Plan of Care (Signed)
Problem: Education: Goal: Knowledge of patient specific risk factors addressed and post discharge goals established will improve Outcome: Progressing A fib discussed with patient and risk factors for CVA/TIA  Problem: Self-Care: Goal: Ability to communicate needs accurately will improve Outcome: Progressing Dysphasia resolved, able to communicate well.  Problem: Nutrition: Goal: Risk of aspiration will decrease Outcome: Progressing No dysphagia, able to swallow well. Goal: Dietary intake will improve Outcome: Progressing Cardiac diet placed, vomited after eating only ginger ale and saltines.

## 2017-07-07 NOTE — Progress Notes (Signed)
  Echocardiogram 2D Echocardiogram has been performed.  Virgie Kunda T Vanessa Alesi 07/07/2017, 5:06 PM

## 2017-07-07 NOTE — Progress Notes (Signed)
STROKE TEAM PROGRESS NOTE   HISTORY OF PRESENT ILLNESS (per record) Carolyn Vazquez is an 76 y.o. female who presented for evaluation of nausea, headache, slurred speech, dysphagia and drooling. Dysphagia was so severe that she was unable to drink through a straw on Saturday, before she presented. Symptoms began at 10:30 AM on Saturday and continued to get worse prior to her decision to be seen in the ED. She was back to her baseline after arriving to the ED except for feeling "weak all over" as though "I could fall to the floor". She has no prior history of stroke or MI.   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 She states she has not had any issues with her Wegener's for a while and is not currently on any immunomodulatory medications; she is [facility] for her Wegener's. She gets frequent headaches. PMHx also includes hypothyroidism, HTN, CKD and HLD. She was not on an antiplatelet agent or anticoagulation at home.   An echocardiogram in February 2016 revealed an EF of 55-60%. She was seen by her Cardiologist this week for possible atrial fibrillation. She also sees a Garment/textile technologist as an outpatient.   CT head and CTA head/neck in the ED showed no acute abnormality or vessel occlusion. Was newly diagnosed with atrial fibrillation in the ED. She was started on heparin gtt and transferred to [facility] for further evaluation. She was started on Cardizem gtt in the ED due to RVR in the setting of her atrial fibrillation.    SUBJECTIVE (INTERVAL HISTORY) No family is at the bedside.  She is at her neuro baseline. Complains of mild HA. On heparin IV. Pending MRI and CTA head and neck. Discussed with Dr. Wendee Beavers outside of the room.    OBJECTIVE Temp:  [97.1 F (36.2 C)-98.4 F (36.9 C)] 97.8 F (36.6 C) (08/19 1120) Pulse Rate:  [91-127] 91 (08/19 1120) Cardiac  Rhythm: Atrial fibrillation (08/19 0845) Resp:  [12-33] 21 (08/19 1120) BP: (101-157)/(73-107) 112/81 (08/19 1120) SpO2:  [93 %-97 %] 95 % (08/19 0709) Weight:  [135 lb (61.2 kg)-150 lb (68 kg)] 135 lb (61.2 kg) (08/18 2145)  CBC:   Recent Labs Lab 07/06/17 1407 07/06/17 1433 07/07/17 0413  WBC 8.8  --  9.3  NEUTROABS 5.0  --  6.0  HGB 14.8 15.0 13.6  HCT 42.5 44.0 41.6  MCV 91.6  --  92.0  PLT 292  --  177    Basic Metabolic Panel:   Recent Labs Lab 07/06/17 1407 07/06/17 1433 07/07/17 0413  NA 142 142 139  K 3.6 3.6 3.9  CL 104 103 104  CO2 26  --  25  GLUCOSE 148* 148* 118*  BUN 10 9 <5*  CREATININE 0.81 0.90 0.72  CALCIUM 9.8  --  8.7*    Lipid Panel:     Component Value Date/Time   CHOL 192 07/07/2017 0413   TRIG 66 07/07/2017 0413   HDL 55 07/07/2017 0413   CHOLHDL 3.5 07/07/2017 0413   VLDL 13 07/07/2017 0413   LDLCALC 124 (H) 07/07/2017 0413   HgbA1c:  Lab Results  Component Value Date   HGBA1C 5.7 (H) 07/07/2017   Urine Drug Screen: No results found for: LABOPIA, COCAINSCRNUR, LABBENZ, AMPHETMU, THCU, LABBARB  Alcohol Level No results found for: [place] I have personally reviewed the radiological images below  and agree with the radiology interpretations.  Ct Head W & Wo Contrast 07/06/2017 No acute finding by CT. Minimal small vessel change of the hemispheric white matter. No abnormal contrast enhancement.   MR Brain W & Wo Contrast - pending  CTA Head and Neck - pending  TTE pending   PHYSICAL EXAM Vitals:   07/07/17 0400 07/07/17 0709 07/07/17 1034 07/07/17 1120  BP: 103/74 112/83 128/86 112/81  Pulse: (!) 101 92  91  Resp: 16 16  (!) 21  Temp: 97.8 F (36.6 C) 98.1 F (36.7 C)  97.8 F (36.6 C)  TempSrc: Oral Oral  Oral  SpO2: 95% 95%    Weight:      Height:        Temp:  [97.7 F (36.5 C)-98.4 F (36.9 C)] 97.8 F (36.6 C) (08/19 1120) Pulse Rate:  [91-119] 91 (08/19 1120) Resp:  [12-33] 21 (08/19 1120) BP:  (101-144)/(73-107) 112/81 (08/19 1120) SpO2:  [93 %-97 %] 95 % (08/19 0709) Weight:  [135 lb (61.2 kg)] 135 lb (61.2 kg) (08/18 2145)  General - Well nourished, well developed, in no apparent distress.  Ophthalmologic - Sharp disc margins OU.   Cardiovascular - irregularly irregular heart rate and rhythm.  Mental Status -  Level of arousal and orientation to time, place, and person were intact. Language including expression, naming, repetition, comprehension was assessed and found intact. Attention span and concentration were normal. Fund of Knowledge was assessed and was intact.  Cranial Nerves II - XII - II - Visual field intact OU. III, IV, VI - Extraocular movements intact. V - Facial sensation intact bilaterally. VII - Facial movement intact bilaterally. VIII - Hearing & vestibular intact bilaterally. X - Palate elevates symmetrically. XI - Chin turning & shoulder shrug intact bilaterally. XII - Tongue protrusion intact.  Motor Strength - The patient's strength was normal in all extremities and pronator drift was absent.  Bulk was normal and fasciculations were absent.   Motor Tone - Muscle tone was assessed at the neck and appendages and was normal.  Reflexes - The patient's reflexes were 1+ in all extremities and she had no pathological reflexes.  Sensory - Light touch, temperature/pinprick, vibration and proprioception were assessed and were symmetrical.    Coordination - The patient had normal movements in the hands and feet with no ataxia or dysmetria.  Tremor was absent.  Gait and Station - deferred.   ASSESSMENT/PLAN Ms. Carolyn Vazquez is a 76 y.o. female with history of new onset atrial fibrillation, Wegener granulomatosis, thyroid disease, hypertension, hyperlipidemia, and chronic kidney disease, presenting with generalized weakness, nausea, headache, slurred speech, dysphagia and drooling. She did not receive IV t-PA due to resolution of deficits.  TIA -  secondary to new diagnosed atrial fibrillation.  Resultant  No neuro deficit  CT head - No acute finding  MRI head with and without contrast - pending  CTA H&N - pending  2D Echo - pending  LDL - 124  HgbA1c - 5.7  VTE prophylaxis - Eliquis Diet Heart Room service appropriate? Yes; Fluid consistency: Thin  aspirin 81 mg daily prior to admission, now on aspirin 81 mg daily and Eliquis. Do not think she needs ASA 51m at this time, will discontinue.  Patient counseled to be compliant with her antithrombotic medications  Ongoing aggressive stroke risk factor management  Therapy recommendations:  pending  Disposition:  Pending  Afib, new diagnosis  Cardiology on board  On IV heparin and now transitioned to  eliquis  On amiodarone  Wegener's granulomatosis  Follows with [facility] care  Was on cellcept and steroid in the past, now off  ESR and CRP negative this admission  Seems in remission  MRI brain with and without contrast pending to rule out any cerebritis/CNS vasculitis.   Hypertension  Blood pressure runs mildly low  Permissive hypertension (OK if < 180/105) but gradually normalize in 5-7 days  Long-term BP goal normotensive  Hyperlipidemia  Home meds:  No lipid lowering medications prior to admission  LDL 124, goal < 70  Reported allergy to Lipitor and Zocor  Consider outpt follow up with PCP or cardiology to consider PCSK9 inhibitors  Other Stroke Risk Factors  Advanced age  ETOH use, advised to drink no more than 1 drink per day  Family hx stroke (mother and father)  Other [facility] day # 1  Rosalin Hawking, MD PhD Stroke Neurology 07/07/2017 2:47 PM   To contact Stroke Continuity provider, please refer to [email or web address] After hours, contact General Neurology

## 2017-07-08 ENCOUNTER — Inpatient Hospital Stay (HOSPITAL_COMMUNITY): Payer: Medicare Other

## 2017-07-08 LAB — ECHOCARDIOGRAM COMPLETE
Height: 61.5 in
Weight: 2160 oz

## 2017-07-08 MED ORDER — DILTIAZEM HCL ER COATED BEADS 180 MG PO CP24: 180.0000 mg | ORAL_CAPSULE | Freq: Every day | ORAL | Status: DC

## 2017-07-08 MED ORDER — DILTIAZEM HCL 60 MG PO TABS
60.0000 mg | ORAL_TABLET | Freq: Once | ORAL | Status: AC
  Administered 2017-07-08: 60 mg via ORAL
  Filled 2017-07-08: qty 1

## 2017-07-08 MED ORDER — GADOBENATE DIMEGLUMINE 529 MG/ML IV SOLN
15.0000 mL | Freq: Once | INTRAVENOUS | Status: AC
  Administered 2017-07-08: 15 mL via INTRAVENOUS

## 2017-07-08 MED ORDER — DILTIAZEM HCL ER COATED BEADS 240 MG PO CP24
240.0000 mg | ORAL_CAPSULE | Freq: Every day | ORAL | Status: DC
  Administered 2017-07-09: 240 mg via ORAL
  Filled 2017-07-08: qty 1

## 2017-07-08 MED ORDER — SENNA 8.6 MG PO TABS: 2.0000 | ORAL_TABLET | Freq: Every evening | ORAL | Status: DC | PRN

## 2017-07-08 NOTE — Plan of Care (Signed)
Problem: Physical Regulation: Goal: Ability to maintain clinical measurements within normal limits will improve Outcome: Progressing Patient's HR increases to 130's with activity.  Will continued to monitor.

## 2017-07-08 NOTE — Progress Notes (Signed)
Progress Note  Patient Name: Carolyn Vazquez Date of Encounter: 07/08/2017  Primary Cardiologist: Meda Coffee  Subjective   Feeling well this morning. No complaints. Hopeful to go home soon.     Inpatient Medications    Scheduled Meds: . apixaban  5 mg Oral BID  . B-complex with vitamin C  1 tablet Oral Daily  . cholecalciferol  2,000 Units Oral Daily  . diltiazem  120 mg Oral Daily  . levothyroxine  100 mcg Oral QAC breakfast  . losartan  25 mg Oral Daily  . scopolamine  1 patch Transdermal Q72H   Continuous Infusions:  PRN Meds: acetaminophen **OR** acetaminophen (TYLENOL) oral liquid 160 mg/5 mL **OR** acetaminophen, promethazine   Vital Signs    Vitals:   07/08/17 0004 07/08/17 0506 07/08/17 0801 07/08/17 0933  BP: 118/76 130/77 125/85 124/83  Pulse: (!) 118 94 95   Resp: (!) 26  (!) 30   Temp: 98.2 F (36.8 C) 97.6 F (36.4 C) 97.9 F (36.6 C)   TempSrc: Oral Oral Oral   SpO2: 98% 98% 96%   Weight:      Height:        Intake/Output Summary (Last 24 hours) at 07/08/17 1148 Last data filed at 07/08/17 0900  Gross per 24 hour  Intake              840 ml  Output              400 ml  Net              440 ml   Filed Weights   07/06/17 1413 07/06/17 2145  Weight: 150 lb (68 kg) 135 lb (61.2 kg)    Telemetry    Afib Rates 90-120s - Personally Reviewed  ECG    N/a - Personally Reviewed  Physical Exam   General: Very pleasant older W female appearing in no acute distress. Head: Normocephalic, atraumatic.  Neck: Supple without bruits, JVD. Lungs:  Resp regular and unlabored, CTA. Heart: RRR, S1, S2, no S3, S4, or murmur; no rub. Abdomen: Soft, non-tender, non-distended with normoactive bowel sounds. No hepatomegaly. No rebound/guarding. No obvious abdominal masses. Extremities: No clubbing, cyanosis, edema. Distal pedal pulses are 2+ bilaterally. Neuro: Alert and oriented X 3. Moves all extremities spontaneously. Psych: Normal affect.  Labs      Chemistry Recent Labs Lab 07/06/17 1407 07/06/17 1433 07/07/17 0413  NA 142 142 139  K 3.6 3.6 3.9  CL 104 103 104  CO2 26  --  25  GLUCOSE 148* 148* 118*  BUN 10 9 <5*  CREATININE 0.81 0.90 0.72  CALCIUM 9.8  --  8.7*  PROT 7.7  --   --   ALBUMIN 4.3  --   --   AST 33  --   --   ALT 23  --   --   ALKPHOS 94  --   --   BILITOT 0.9  --   --   GFRNONAA >60  --  >60  GFRAA >60  --  >60  ANIONGAP 12  --  10     Hematology Recent Labs Lab 07/06/17 1407 07/06/17 1433 07/07/17 0413  WBC 8.8  --  9.3  RBC 4.64  --  4.52  HGB 14.8 15.0 13.6  HCT 42.5 44.0 41.6  MCV 91.6  --  92.0  MCH 31.9  --  30.1  MCHC 34.8  --  32.7  RDW 12.4  --  12.3  PLT 292  --  278    Cardiac EnzymesNo results for input(s): TROPONINI in the last 168 hours. No results for input(s): TROPIPOC in the last 168 hours.   BNPNo results for input(s): BNP, PROBNP in the last 168 hours.   DDimer No results for input(s): DDIMER in the last 168 hours.    Radiology    Ct Angio Head W Or Wo Contrast  Result Date: 07/07/2017 CLINICAL DATA:  Sudden onset of severe headache. Weakness beginning earlier today. Atrial fibrillation. EXAM: CT ANGIOGRAPHY HEAD AND NECK TECHNIQUE: Multidetector CT imaging of the head and neck was performed using the standard protocol during bolus administration of intravenous contrast. Multiplanar CT image reconstructions and MIPs were obtained to evaluate the vascular anatomy. Carotid stenosis measurements (when applicable) are obtained utilizing NASCET criteria, using the distal internal carotid diameter as the denominator. CONTRAST:  50 cc Isovue 370 COMPARISON:  Head CT 07/06/2017. FINDINGS: CT HEAD FINDINGS Brain: No accelerated atrophy. Mild small vessel ischemic changes of the deep white matter. No sign of acute infarction, mass lesion, hemorrhage, hydrocephalus or extra-axial collection. Vascular: There is atherosclerotic calcification of the major vessels at the base of the  brain. Skull: Negative Sinuses: Some ethmoid opacification.  Otherwise clear. Orbits: Orbits negative CTA NECK FINDINGS Aortic arch: Mild aortic atherosclerosis. No aneurysm or dissection. Right carotid system: Common carotid artery widely patent to the bifurcation. Right carotid bifurcation normal. Right ICA bulb normal. Tortuous cervical ICA without stenosis. Left carotid system: Common carotid artery widely patent to the bifurcation. Mild atherosclerotic calcification of the carotid bifurcation without stenosis or irregularity of the ICA bulb or cervical ICA. Vertebral arteries: Right vertebral artery dominant. No stenosis of the right vertebral artery origin or along the course in the cervical region. Left vertebral artery origin widely patent. Left vertebral artery tortuous but widely patent through the cervical region. Skeleton: Moderate cervical spondylosis. Other neck: No mass or lymphadenopathy. Upper chest: No active disease. Review of the MIP images confirms the above findings CTA HEAD FINDINGS Anterior circulation: Both internal carotid arteries are widely patent through the skullbase and siphon regions. Peripheral siphon atherosclerotic calcification but no stenosis. Anterior and middle cerebral vessels are patent without proximal stenosis, aneurysm or vascular malformation. Azygos anterior cerebral artery. Posterior circulation: Both vertebral arteries are widely patent to the basilar. No basilar stenosis. Posterior circulation branch vessels are normal. Venous sinuses: Patent and normal Anatomic variants: None Delayed phase: No abnormal enhancement Review of the MIP images confirms the above findings IMPRESSION: Negative CT angiography for a person of this age. No sign of stroke since yesterday. No large or medium vessel stenosis or occlusion. Minimal non stenotic atherosclerotic change at the left carotid bifurcation. Electronically Signed   By: Nelson Chimes M.D.   On: 07/07/2017 18:53   Ct Head W  & Wo Contrast  Result Date: 07/06/2017 CLINICAL DATA:  Sudden onset of severe headache. Recent diagnosis of atrial fibrillation. EXAM: CT HEAD WITHOUT AND WITH CONTRAST TECHNIQUE: Contiguous axial images were obtained from the base of the skull through the vertex without and with intravenous contrast CONTRAST:  68mL ISOVUE-300 IOPAMIDOL (ISOVUE-300) INJECTION 61% COMPARISON:  None. FINDINGS: Brain: Mild age related volume loss. The brainstem and cerebellum appear normal. Cerebral hemispheres show minimal areas of low-density in the deep white matter consistent with mild small vessel change. No sign of acute infarction, mass lesion, hemorrhage, hydrocephalus or extra-axial collection. No abnormal enhancement. Vascular: There is atherosclerotic calcification of the major vessels at the base of  the brain. No abnormal enhancement. No abnormal vascular finding. Skull: Normal Sinuses/Orbits: Minimal mucosal thickening of the paranasal sinuses, possibly subclinical. Orbits negative. Other: None IMPRESSION: No acute finding by CT. Minimal small vessel change of the hemispheric white matter. No abnormal contrast enhancement. Electronically Signed   By: Nelson Chimes M.D.   On: 07/06/2017 15:09   Ct Angio Neck W Or Wo Contrast  Result Date: 07/07/2017 CLINICAL DATA:  Sudden onset of severe headache. Weakness beginning earlier today. Atrial fibrillation. EXAM: CT ANGIOGRAPHY HEAD AND NECK TECHNIQUE: Multidetector CT imaging of the head and neck was performed using the standard protocol during bolus administration of intravenous contrast. Multiplanar CT image reconstructions and MIPs were obtained to evaluate the vascular anatomy. Carotid stenosis measurements (when applicable) are obtained utilizing NASCET criteria, using the distal internal carotid diameter as the denominator. CONTRAST:  50 cc Isovue 370 COMPARISON:  Head CT 07/06/2017. FINDINGS: CT HEAD FINDINGS Brain: No accelerated atrophy. Mild small vessel ischemic  changes of the deep white matter. No sign of acute infarction, mass lesion, hemorrhage, hydrocephalus or extra-axial collection. Vascular: There is atherosclerotic calcification of the major vessels at the base of the brain. Skull: Negative Sinuses: Some ethmoid opacification.  Otherwise clear. Orbits: Orbits negative CTA NECK FINDINGS Aortic arch: Mild aortic atherosclerosis. No aneurysm or dissection. Right carotid system: Common carotid artery widely patent to the bifurcation. Right carotid bifurcation normal. Right ICA bulb normal. Tortuous cervical ICA without stenosis. Left carotid system: Common carotid artery widely patent to the bifurcation. Mild atherosclerotic calcification of the carotid bifurcation without stenosis or irregularity of the ICA bulb or cervical ICA. Vertebral arteries: Right vertebral artery dominant. No stenosis of the right vertebral artery origin or along the course in the cervical region. Left vertebral artery origin widely patent. Left vertebral artery tortuous but widely patent through the cervical region. Skeleton: Moderate cervical spondylosis. Other neck: No mass or lymphadenopathy. Upper chest: No active disease. Review of the MIP images confirms the above findings CTA HEAD FINDINGS Anterior circulation: Both internal carotid arteries are widely patent through the skullbase and siphon regions. Peripheral siphon atherosclerotic calcification but no stenosis. Anterior and middle cerebral vessels are patent without proximal stenosis, aneurysm or vascular malformation. Azygos anterior cerebral artery. Posterior circulation: Both vertebral arteries are widely patent to the basilar. No basilar stenosis. Posterior circulation branch vessels are normal. Venous sinuses: Patent and normal Anatomic variants: None Delayed phase: No abnormal enhancement Review of the MIP images confirms the above findings IMPRESSION: Negative CT angiography for a person of this age. No sign of stroke since  yesterday. No large or medium vessel stenosis or occlusion. Minimal non stenotic atherosclerotic change at the left carotid bifurcation. Electronically Signed   By: Nelson Chimes M.D.   On: 07/07/2017 18:53   Mr Jeri Cos ZO Contrast  Result Date: 07/08/2017 CLINICAL DATA:  Nausea, headache, slurred speech. Follow-up stroke. History of hypertension, hyperlipidemia. EXAM: MRI HEAD WITHOUT AND WITH CONTRAST TECHNIQUE: Multiplanar, multiecho pulse sequences of the brain and surrounding structures were obtained without and with intravenous contrast. CONTRAST:  43mL MULTIHANCE GADOBENATE DIMEGLUMINE 529 MG/ML IV SOLN COMPARISON:  CT HEAD July 06, 2017 FINDINGS: INTRACRANIAL CONTENTS: 1 cm RIGHT frontal operculum are focus of reduced diffusion, low ADC values. 5 mm focus reduced diffusion RIGHT occipital lobe with low ADC values. No susceptibility artifact to suggest hemorrhage. The ventricles and sulci are normal for patient's age. No suspicious parenchymal signal, masses, mass effect. A few punctate supratentorial white matter FLAIR T2 hyperintensities  compatible with mild chronic small vessel ischemic disease, less than expected for age. No abnormal intraparenchymal or extra-axial enhancement. No abnormal extra-axial fluid collections. No extra-axial masses. VASCULAR: Normal major intracranial vascular flow voids present at skull base. SKULL AND UPPER CERVICAL SPINE: No abnormal sellar expansion. No suspicious calvarial bone marrow signal. Craniocervical junction maintained. Severe degenerative change of the imaged cervical spine. SINUSES/ORBITS: Pan paranasal sinus mucosal thickening. Mastoid air cells are well aerated.The included ocular globes and orbital contents are non-suspicious. OTHER: None. IMPRESSION: 1. 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 2. Otherwise negative MRI of the brain with and without contrast for age. Electronically Signed   By: Elon Alas M.D.   On: 07/08/2017  04:44    Cardiac Studies   TTE: pending  Patient Profile     76 y.o. female with PMH of HTN, HL, Wegeners, new onset AFIB, and Stroke who presented with Afib.  Assessment & Plan    1. PAF: New this admission in the setting of acute stroke. Was started on IV dilt with improvement in rate. Transitioned to oral Dilt  120mg  daily yesterday. Rates still remain elevated today, will continue and increase Dilt to 180mg  daily for better rate control. -- continue Eliquis 5mg  BID -- echo pending   2. Wegener's: stable, followed through [facility]  3. Stroke: Noted on MRI this morning. Neurology following   4. HTN: Stable with current therapy.   5. HL: reports intolerance with statins in the past with Lipitor and simvastatin   -- Consider for PCSK inhibitors   Signed, Reino Bellis, NP  07/08/2017, 11:48 AM  Pager # [phone]   Patient seen, examined. Available data reviewed. Agree with findings, assessment, and plan as outlined by Reino Bellis, NP. Exam reveals pleasant, alert woman in NAD, lungs CTA, heart irregularly irregular no murmur, abd soft, NT, extremities no edema. Data reviewed. HR remains elevated. Increase dilt further to 240 mg daily. Otherwise continue same Rx. Will FU in am but suspect she will be approaching stability for DC.  Sherren Mocha, M.D. 07/08/2017 6:49 PM

## 2017-07-08 NOTE — Progress Notes (Signed)
STROKE TEAM PROGRESS NOTE   HISTORY OF PRESENT ILLNESS (per record) Carolyn Vazquez is an 76 y.o. female who presented for evaluation of nausea, headache, slurred speech, dysphagia and drooling. Dysphagia was so severe that she was unable to drink through a straw on Saturday, before she presented. Symptoms began at 10:30 AM on Saturday and continued to get worse prior to her decision to be seen in the ED. She was back to her baseline after arriving to the ED except for feeling "weak all over" as though "I could fall to the floor". She has no prior history of stroke or MI.   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 She states she has not had any issues with her Wegener's for a while and is not currently on any immunomodulatory medications; she is followed at [facility] for her Wegener's. She gets frequent headaches. PMHx also includes hypothyroidism, HTN, CKD and HLD. She was not on an antiplatelet agent or anticoagulation at home.   An echocardiogram in February 2016 revealed an EF of 55-60%. She was seen by her Cardiologist this week for possible atrial fibrillation. She also sees a Garment/textile technologist as an outpatient.   CT head and CTA head/neck in the ED showed no acute abnormality or vessel occlusion. Was newly diagnosed with atrial fibrillation in the ED. She was started on heparin gtt and transferred to [facility] for further evaluation. She was started on Cardizem gtt in the ED due to RVR in the setting of her atrial fibrillation.    SUBJECTIVE (INTERVAL HISTORY) No family is at the bedside.  She has no complaintse.  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 CT angiograms were negative OBJECTIVE Temp:  [97.6 F (36.4 C)-98.2 F (36.8 C)] 98.2 F (36.8 C) (08/20 1255) Pulse Rate:  [94-120] 105 (08/20 1255) Cardiac Rhythm: Atrial fibrillation  (08/20 0949) Resp:  [22-30] 22 (08/20 1255) BP: (118-137)/(76-86) 119/81 (08/20 1255) SpO2:  [93 %-98 %] 97 % (08/20 1255)  CBC:   Recent Labs Lab 07/06/17 1407 07/06/17 1433 07/07/17 0413  WBC 8.8  --  9.3  NEUTROABS 5.0  --  6.0  HGB 14.8 15.0 13.6  HCT 42.5 44.0 41.6  MCV 91.6  --  92.0  PLT 292  --  333    Basic Metabolic Panel:   Recent Labs Lab 07/06/17 1407 07/06/17 1433 07/07/17 0413  NA 142 142 139  K 3.6 3.6 3.9  CL 104 103 104  CO2 26  --  25  GLUCOSE 148* 148* 118*  BUN 10 9 <5*  CREATININE 0.81 0.90 0.72  CALCIUM 9.8  --  8.7*    Lipid Panel:     Component Value Date/Time   CHOL 192 07/07/2017 0413   TRIG 66 07/07/2017 0413   HDL 55 07/07/2017 0413   CHOLHDL 3.5 07/07/2017 0413   VLDL 13 07/07/2017 0413   LDLCALC 124 (H) 07/07/2017 0413   HgbA1c:  Lab Results  Component Value Date   HGBA1C 5.7 (H) 07/07/2017   Urine Drug Screen: No results found for: LABOPIA, COCAINSCRNUR, LABBENZ, AMPHETMU, THCU, LABBARB  Alcohol Level No results found for: [place] I have personally reviewed the radiological images below and agree with the radiology interpretations.  Ct Head W & Wo Contrast 07/06/2017 No acute finding by CT. Minimal small vessel change of the  hemispheric white matter. No abnormal contrast enhancement.   MR Brain W & Wo Contrast - 1. 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 2. Otherwise negative MRI of the brain with and without contrast for age. CTA Head and Neck -Negative CT angiography for a person of this age. No sign of stroke since yesterday. No large or medium vessel stenosis or occlusion. Minimal non stenotic atherosclerotic change at the left carotid bifurcation. TTE Left ventricle: The cavity size was normal. Wall thickness was   increased in a pattern of mild LVH. Systolic function was normal.   The estimated ejection fraction was in the range of 50% to 55%.   Wall motion was normal; there were no  regional wall motion   abnormalities.   PHYSICAL EXAM Vitals:   07/08/17 0506 07/08/17 0801 07/08/17 0933 07/08/17 1255  BP: 130/77 125/85 124/83 119/81  Pulse: 94 95  (!) 105  Resp:  (!) 30  (!) 22  Temp: 97.6 F (36.4 C) 97.9 F (36.6 C)  98.2 F (36.8 C)  TempSrc: Oral Oral  Oral  SpO2: 98% 96%  97%  Weight:      Height:        Temp:  [97.6 F (36.4 C)-98.2 F (36.8 C)] 98.2 F (36.8 C) (08/20 1255) Pulse Rate:  [94-120] 105 (08/20 1255) Resp:  [22-30] 22 (08/20 1255) BP: (118-137)/(76-86) 119/81 (08/20 1255) SpO2:  [93 %-98 %] 97 % (08/20 1255)  Physical Exam ; pleasant elderly Caucasian lady not in distress. . Afebrile. Head is nontraumatic. Neck is supple without bruit.    Cardiac exam no murmur or gallop. Lungs are clear to auscultation. Distal pulses are well felt.  Neurological Exam ;  Awake  Alert oriented x 3. Normal speech and language.eye movements full without nystagmus.fundi were not visualized. Vision acuity and fields appear normal. Hearing is normal. Palatal movements are normal. Face symmetric. Tongue midline. Normal strength, tone, reflexes and coordination. Normal sensation. Gait deferred. ASSESSMENT/PLAN Ms. Carolyn Vazquez is a 76 y.o. female with history of new onset atrial fibrillation, Wegener granulomatosis, thyroid disease, hypertension, hyperlipidemia, and chronic kidney disease, presenting with generalized weakness, nausea, headache, slurred speech, dysphagia and drooling. She did not receive IV t-PA due to resolution of deficits.  TIA - secondary to new diagnosed atrial fibrillation.  Resultant  No neuro deficit  CT head - No acute finding MRI head with and without contrast - 1. 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 2. Otherwise negative MRI of the brain with and without contrast for  Age. CTA H&N -Negative CT angiography for a person of this age. No sign of stroke since yesterday. No large or medium vessel stenosis  or occlusion. Minimal non stenotic atherosclerotic change at the left carotid  Bifurcation. 2D Echo - Left ventricle: The cavity size was normal. Wall thickness was   increased in a pattern of mild LVH. Systolic function was normal.   The estimated ejection fraction was in the range of 50% to 55%.   Wall motion was normal; there were no regional wall motion    abnormalities.  LDL - 124  HgbA1c - 5.7  VTE prophylaxis - Eliquis Diet Heart Room service appropriate? Yes; Fluid consistency: Thin  aspirin 81 mg daily prior to admission, now on   Eliquis.    Patient counseled to be compliant with her antithrombotic medications  Ongoing aggressive stroke risk factor management  Therapy recommendations:  HH PT  Disposition:  home  Afib,  new diagnosis  Cardiology on board  On IV heparin and now transitioned to eliquis  On amiodarone  Wegener's granulomatosis  Follows with [facility] care  Was on cellcept and steroid in the past, now off  ESR and CRP negative this admission  Seems in remission  MRI brain with and without contrast pending to rule out any cerebritis/CNS vasculitis.   Hypertension  Blood pressure runs mildly low  Permissive hypertension (OK if < 180/105) but gradually normalize in 5-7 days  Long-term BP goal normotensive  Hyperlipidemia  Home meds:  No lipid lowering medications prior to admission  LDL 124, goal < 70  Reported allergy to Lipitor and Zocor  Consider outpt follow up with PCP or cardiology to consider PCSK9 inhibitors  Other Stroke Risk Factors  Advanced age  ETOH use, advised to drink no more than 1 drink per day  Family hx stroke (mother and father)  Other [facility] day # 2 I have personally examined this patient, reviewed notes, independently viewed imaging studies, participated in medical decision making and plan of care.ROS completed by me personally and pertinent positives fully documented  I have made any  additions or clarifications directly to the above note. Recommend eliquis for anticoagulation and aggressive risk factor modification. Greater than 50% time during this 25 minute visit was spent on counseling and coordination of care about her embolic strokes, risks factor modification and prevention discussion. Follow-up as an outpatient in stroke clinic with Dr. [name] in 6 weeks. Stroke team will sign off. Kindly call for questions.  Antony Contras, MD Medical Director [place] Pager: [phone] 07/08/2017 3:05 PM  Antony Contras, MD Stroke Neurology 07/08/2017 2:59 PM   To contact Stroke Continuity provider, please refer to [email or web address] After hours, contact General Neurology

## 2017-07-08 NOTE — Progress Notes (Signed)
SLP Cancellation Note  Patient Details Name: Carolyn Vazquez MRN: [id number] DOB: [birth year]   Cancelled treatment:       Reason Eval/Treat Not Completed: SLP screened, no needs identified, will sign off   Burch Marchuk 07/08/2017, 2:05 PM

## 2017-07-08 NOTE — Care Management (Signed)
Consult for cost of apixaban (ELIQUIS). Patient can use 30 day free card . Placed benefits check . Will continue to follow.  Magdalen Spatz RN BSN [phone]

## 2017-07-08 NOTE — Progress Notes (Signed)
Arrived from [facility]. Alert and oriented. C/o of headache 3/10. Call light within reach.

## 2017-07-08 NOTE — Progress Notes (Addendum)
Physical Therapy Treatment Patient Details Name: Carolyn Vazquez MRN: [id number] DOB: 24-Feb-1941 Today's Date: 07/08/2017    History of Present Illness Patient is a 76 y/o female who presents with nausea, headache, slurred speech, dysphagia and drooling. Head CT-unremarkable. MRi- right frontal and occipital infarcts. Found to have A-fib with RVR in ED. PMH includes HTN, CKD, wegnersgranulomatosis,    PT Comments    Patient progressing well towards PT goals. Demonstrates balance deficits during gait training today with staggering to right/left. HR continues to elevate with activity- up to 163 bpm. RN aware. May try RW next session to improve balance and decrease fall risk. Encouraged walking a few more times today with RN to improve strength/mobility. Will follow.    Follow Up Recommendations  Home health PT;Supervision for mobility/OOB     Equipment Recommendations  None recommended by PT    Recommendations for Other Services       Precautions / Restrictions Precautions Precautions: Fall Precaution Comments: watch HR Restrictions Weight Bearing Restrictions: No    Mobility  Bed Mobility Overal bed mobility: Modified Independent             General bed mobility comments: Use of rail and HOB elevated.  Transfers Overall transfer level: Needs assistance Equipment used: None Transfers: Sit to/from Stand Sit to Stand: Min guard         General transfer comment: Min guard for safety. Stood from [employer], from toilet x1.   Ambulation/Gait Ambulation/Gait assistance: Min assist Ambulation Distance (Feet): 150 Feet Assistive device: None Gait Pattern/deviations: Step-through pattern;Decreased stride length;Staggering right;Staggering left Gait velocity: decreased Gait velocity interpretation: <1.8 ft/sec, indicative of risk for recurrent falls General Gait Details: Slow, unsteady gait with staggering noted right/left. HR up to 163 bpm. BP stable.   Stairs             Wheelchair Mobility    Modified Rankin (Stroke Patients Only) Modified Rankin (Stroke Patients Only) Pre-Morbid Rankin Score: No symptoms Modified Rankin: Moderate disability     Balance Overall balance assessment: Needs assistance Sitting-balance support: Feet supported;No upper extremity supported Sitting balance-Leahy Scale: Good Sitting balance - Comments: Able to donn/doff socks sitting EOB without difficulty.    Standing balance support: During functional activity Standing balance-Leahy Scale: Fair Standing balance comment: Able to stand at sink with SLS stepping down on pedals to wash hands without difficulty. Requires at least 1 UE support.                            Cognition Arousal/Alertness: Awake/alert Behavior During Therapy: WFL for tasks assessed/performed Overall Cognitive Status: Within Functional Limits for tasks assessed                                        Exercises      General Comments        Pertinent Vitals/Pain Pain Assessment: No/denies pain    Home Living                      Prior Function            PT Goals (current goals can now be found in the care plan section) Progress towards PT goals: Progressing toward goals    Frequency    Min 4X/week      PT Plan Current plan remains appropriate  Co-evaluation              AM-PAC PT "6 Clicks" Daily Activity  Outcome Measure  Difficulty turning over in bed (including adjusting bedclothes, sheets and blankets)?: None Difficulty moving from lying on back to sitting on the side of the bed? : None Difficulty sitting down on and standing up from a chair with arms (e.g., wheelchair, bedside commode, etc,.)?: None Help needed moving to and from a bed to chair (including a wheelchair)?: A Little Help needed walking in hospital room?: A Little Help needed climbing 3-5 steps with a railing? : A Little 6 Click Score: 21    End of  Session Equipment Utilized During Treatment: Gait belt Activity Tolerance: Treatment limited secondary to medical complications (Comment) (HR) Patient left: in chair;with call bell/phone within reach;with chair alarm set;with nursing/sitter in room Nurse Communication: Mobility status PT Visit Diagnosis: Unsteadiness on feet (R26.81);Difficulty in walking, not elsewhere classified (R26.2)     Time: 7654-6503 PT Time Calculation (min) (ACUTE ONLY): 14 min  Charges:  $Gait Training: 8-22 mins                    G Codes:       [name], PT, DPT [phone]     [place] 07/08/2017, 10:09 AM

## 2017-07-08 NOTE — Evaluation (Signed)
Occupational Therapy Evaluation Patient Details Name: Carolyn Vazquez MRN: [id number] DOB: 1940-12-06 Today's Date: 07/08/2017    History of Present Illness Patient is a 76 y/o female who presents with nausea, headache, slurred speech, dysphagia and drooling. Head CT-unremarkable. MRi- right frontal and occipital infarcts. Found to have A-fib with RVR in ED. PMH includes HTN, CKD, wegnersgranulomatosis, and hyperlipidemia, thyroid disease.   Clinical Impression   PTA, pt was independent with ADL and functional mobility. She does have visual deficits at baseline and reports no changes from baseline. She was able to track well and read enlarged print. Pt currently requires min guard assist for standing ADL and ADL transfers due to instability and decreased balance. Noted decreased awareness and executive functioning skills at times and will continue to assess. Pt may benefit from home health OT services post-acute D/C in order to maximize independence with ADL and functional mobility. Will continue to follow acutely and update D/C recommendations as appropriate.     Follow Up Recommendations  Home health OT (depending on progress)    Equipment Recommendations  Tub/shower seat    Recommendations for Other Services       Precautions / Restrictions Precautions Precautions: Fall Precaution Comments: watch HR Restrictions Weight Bearing Restrictions: No      Mobility Bed Mobility Overal bed mobility: Modified Independent             General bed mobility comments: Use of rail and HOB elevated.  Transfers Overall transfer level: Needs assistance Equipment used: None Transfers: Sit to/from Stand Sit to Stand: Min guard         General transfer comment: Min guard assist for safety.     Balance Overall balance assessment: Needs assistance Sitting-balance support: Feet supported;No upper extremity supported Sitting balance-Leahy Scale: Good     Standing balance support:  During functional activity Standing balance-Leahy Scale: Fair Standing balance comment: Min guard assist for dynamic standing tasks.                            ADL either performed or assessed with clinical judgement   ADL Overall ADL's : Needs assistance/impaired Eating/Feeding: Set up;Sitting   Grooming: Supervision/safety;Standing   Upper Body Bathing: Set up;Sitting   Lower Body Bathing: Sit to/from stand;Min guard   Upper Body Dressing : Set up;Sitting   Lower Body Dressing: Sit to/from stand;Min guard   Toilet Transfer: Min guard;Ambulation;Regular Toilet   Toileting- Water quality scientist and Hygiene: Sit to/from stand;Min guard       Functional mobility during ADLs: Min guard General ADL Comments: Pt slightly unsteady with standing ADL tasks.      Vision Baseline Vision/History: Wears glasses;Macular Degeneration Wears Glasses: At all times Patient Visual Report: Other (comment) (Unsure if changed from baseline. ) Vision Assessment?: Yes Eye Alignment: Within Functional Limits Ocular Range of Motion: Within Functional Limits Alignment/Gaze Preference: Within Defined Limits Tracking/Visual Pursuits: Able to track stimulus in all quads without difficulty Saccades: Within functional limits Convergence: Within functional limits Visual Fields: No apparent deficits Additional Comments: Pt able to track well and no noted field deficits. However, pt with age related macular degeneration and reports visual deficits at baseline. Feels that her R eye is worse than L. Reports it is difficult to determine if there has been a change.      Perception     Praxis      Pertinent Vitals/Pain Pain Assessment: No/denies pain     Hand Dominance  Extremity/Trunk Assessment Upper Extremity Assessment Upper Extremity Assessment: Generalized weakness   Lower Extremity Assessment Lower Extremity Assessment: Generalized weakness       Communication  Communication Communication: No difficulties   Cognition Arousal/Alertness: Awake/alert Behavior During Therapy: WFL for tasks assessed/performed Overall Cognitive Status: Impaired/Different from baseline Area of Impairment: Awareness                           Awareness: Emergent   General Comments: Need to further assess higher level cognitive skills for safety and executive functioning.    General Comments  HR up to 130 with functional mobility.     Exercises     Shoulder Instructions      Home Living Family/patient expects to be discharged to:: Private residence Living Arrangements: Alone Available Help at Discharge: Family;Available PRN/intermittently Type of Home: House Home Access: Stairs to enter Entrance Stairs-Number of Steps: 2   Home Layout: Two level;Able to live on main level with bedroom/bathroom Alternate Level Stairs-Number of Steps: 1 flight Alternate Level Stairs-Rails: Right Bathroom Shower/Tub: Teacher, early years/pre: Standard     Home Equipment: Cane - single point          Prior Functioning/Environment Level of Independence: Independent        Comments: Drives.        OT Problem List: Decreased strength;Decreased activity tolerance;Impaired balance (sitting and/or standing);Decreased safety awareness;Decreased knowledge of use of DME or AE;Decreased knowledge of precautions      OT Treatment/Interventions: Self-care/ADL training;Therapeutic exercise;Energy conservation;DME and/or AE instruction;Therapeutic activities;Patient/family education;Balance training;Cognitive remediation/compensation    OT Goals(Current goals can be found in the care plan section) Acute Rehab OT Goals Patient Stated Goal: to feel better OT Goal Formulation: With patient Time For Goal Achievement: 07/22/17 Potential to Achieve Goals: Good ADL Goals Pt Will Transfer to Toilet: with modified independence;ambulating;regular height  toilet Pt Will Perform Toileting - Clothing Manipulation and hygiene: with modified independence;sit to/from stand Pt Will Perform Tub/Shower Transfer: Tub transfer;with modified independence;ambulating;shower seat Additional ADL Goal #1: Pt will demonstrate improved executive functioning to complete financial management tasks with no verbal cues.  Additional ADL Goal #2: Pt will demonstrate anticipatory awareness during morning ADL routine in a moderately distracting environment.   OT Frequency: Min 2X/week   Barriers to D/C:            Co-evaluation              AM-PAC PT "6 Clicks" Daily Activity     Outcome Measure Help from another person eating meals?: None Help from another person taking care of personal grooming?: A Little Help from another person toileting, which includes using toliet, bedpan, or urinal?: A Little Help from another person bathing (including washing, rinsing, drying)?: A Little Help from another person to put on and taking off regular upper body clothing?: None Help from another person to put on and taking off regular lower body clothing?: A Little 6 Click Score: 20   End of Session Equipment Utilized During Treatment: Gait belt Nurse Communication: Mobility status  Activity Tolerance: Patient tolerated treatment well Patient left: in chair;with call bell/phone within reach;with chair alarm set  OT Visit Diagnosis: Unsteadiness on feet (R26.81);Other symptoms and signs involving cognitive function;Low vision, both eyes (H54.2)                Time: 1405-1420 OT Time Calculation (min): 15 min Charges:  OT General Charges $OT Visit: 1 Procedure OT  Evaluation $OT Eval Moderate Complexity: 1 Procedure G-Codes:     Norman Herrlich, MS OTR/L  Pager: [place] A Joseguadalupe Stan 07/08/2017, 4:38 PM

## 2017-07-08 NOTE — Progress Notes (Signed)
PROGRESS NOTE    Carolyn Vazquez  TTS:[id number] DOB: [birth year] DOA: 07/06/2017 PCP: [name], MD   Brief Narrative:  Patient is a 76 year old with history of hypothyroidism, hypertension, CAD, Wegener's Granulomatosis, who presented with complaints of slurred speech.   Assessment & Plan:   Active Problems:   TIA (transient ischemic attack) -  Neurology on board and patient currently undergoing TIA workup.    Atrial fibrillation with rapid ventricular response [facility]) - Cardiology consulted and managing. - rate controlling medication increased from 120 mg to 180 mg po daily (diltiazem) - Plan is to anticoagulate with eliquis   DVT prophylaxis: pt was on heparin and will be placed on eliquis Code Status: Full Family Communication: d/c patient directly Disposition Plan: pending work up   Consultants:   Cardiology  Neurology   Procedures:stroke w/u   Antimicrobials: none   Subjective: Pt has no new complaints  Objective: Vitals:   07/08/17 0801 07/08/17 0933 07/08/17 1255 07/08/17 1601  BP: 125/85 124/83 119/81 (!) 129/98  Pulse: 95  (!) 105 (!) 108  Resp: (!) 30  (!) 22 (!) 26  Temp: 97.9 F (36.6 C)  98.2 F (36.8 C) 97.7 F (36.5 C)  TempSrc: Oral  Oral Oral  SpO2: 96%  97% 96%  Weight:      Height:        Intake/Output Summary (Last 24 hours) at 07/08/17 1720 Last data filed at 07/08/17 0900  Gross per 24 hour  Intake              480 ml  Output                0 ml  Net              480 ml   Filed Weights   07/06/17 1413 07/06/17 2145  Weight: 68 kg (150 lb) 61.2 kg (135 lb)    Examination:  General exam: Appears calm and comfortable, in nad Respiratory system: Clear to auscultation. Respiratory effort normal. Cardiovascular system: S1 & S2 heard, RRR. No JVD, murmurs, rubs, gallops or clicks. No pedal edema. Gastrointestinal system: Abdomen is nondistended, soft and nontender. No organomegaly or masses felt. Normal bowel sounds  heard. Central nervous system: Alert and oriented. No facial asymmetry, moves extremity equally  Extremities: Symmetric 5 x 5 power. Skin: No rashes, lesions or ulcers, on limited exam. Psychiatry: Judgement and insight appear normal. Mood & affect appropriate.     Data Reviewed: I have personally reviewed following labs and imaging studies  CBC:  Recent Labs Lab 07/06/17 1407 07/06/17 1433 07/07/17 0413  WBC 8.8  --  9.3  NEUTROABS 5.0  --  6.0  HGB 14.8 15.0 13.6  HCT 42.5 44.0 41.6  MCV 91.6  --  92.0  PLT 292  --  092   Basic Metabolic Panel:  Recent Labs Lab 07/06/17 1407 07/06/17 1433 07/07/17 0413  NA 142 142 139  K 3.6 3.6 3.9  CL 104 103 104  CO2 26  --  25  GLUCOSE 148* 148* 118*  BUN 10 9 <5*  CREATININE 0.81 0.90 0.72  CALCIUM 9.8  --  8.7*   GFR: Estimated Creatinine Clearance: 50.1 mL/min (by C-G formula based on SCr of 0.72 mg/dL). Liver Function Tests:  Recent Labs Lab 07/06/17 1407  AST 33  ALT 23  ALKPHOS 94  BILITOT 0.9  PROT 7.7  ALBUMIN 4.3   No results for input(s): LIPASE, AMYLASE in the last 168  hours. No results for input(s): AMMONIA in the last 168 hours. Coagulation Profile: No results for input(s): INR, PROTIME in the last 168 hours. Cardiac Enzymes: No results for input(s): CKTOTAL, CKMB, CKMBINDEX, TROPONINI in the last 168 hours. BNP (last 3 results) No results for input(s): PROBNP in the last 8760 hours. HbA1C:  Recent Labs  07/07/17 0413  HGBA1C 5.7*   CBG: No results for input(s): GLUCAP in the last 168 hours. Lipid Profile:  Recent Labs  07/07/17 0413  CHOL 192  HDL 55  LDLCALC 124*  TRIG 66  CHOLHDL 3.5   Thyroid Function Tests:  Recent Labs  07/07/17 0413  TSH 1.206   Anemia Panel: No results for input(s): VITAMINB12, FOLATE, FERRITIN, TIBC, IRON, RETICCTPCT in the last 72 hours. Sepsis Labs: No results for input(s): PROCALCITON, LATICACIDVEN in the last 168 hours.  Recent Results (from  the past 240 hour(s))  MRSA PCR Screening     Status: None   Collection Time: 07/06/17 10:07 PM  Result Value Ref Range Status   MRSA by PCR NEGATIVE NEGATIVE Final    Comment:        The GeneXpert MRSA Assay (FDA approved for NASAL specimens only), is one component of a comprehensive MRSA colonization surveillance program. It is not intended to diagnose MRSA infection nor to guide or monitor treatment for MRSA infections.          Radiology Studies: Ct Angio Head W Or Wo Contrast  Result Date: 07/07/2017 CLINICAL DATA:  Sudden onset of severe headache. Weakness beginning earlier today. Atrial fibrillation. EXAM: CT ANGIOGRAPHY HEAD AND NECK TECHNIQUE: Multidetector CT imaging of the head and neck was performed using the standard protocol during bolus administration of intravenous contrast. Multiplanar CT image reconstructions and MIPs were obtained to evaluate the vascular anatomy. Carotid stenosis measurements (when applicable) are obtained utilizing NASCET criteria, using the distal internal carotid diameter as the denominator. CONTRAST:  50 cc Isovue 370 COMPARISON:  Head CT 07/06/2017. FINDINGS: CT HEAD FINDINGS Brain: No accelerated atrophy. Mild small vessel ischemic changes of the deep white matter. No sign of acute infarction, mass lesion, hemorrhage, hydrocephalus or extra-axial collection. Vascular: There is atherosclerotic calcification of the major vessels at the base of the brain. Skull: Negative Sinuses: Some ethmoid opacification.  Otherwise clear. Orbits: Orbits negative CTA NECK FINDINGS Aortic arch: Mild aortic atherosclerosis. No aneurysm or dissection. Right carotid system: Common carotid artery widely patent to the bifurcation. Right carotid bifurcation normal. Right ICA bulb normal. Tortuous cervical ICA without stenosis. Left carotid system: Common carotid artery widely patent to the bifurcation. Mild atherosclerotic calcification of the carotid bifurcation without  stenosis or irregularity of the ICA bulb or cervical ICA. Vertebral arteries: Right vertebral artery dominant. No stenosis of the right vertebral artery origin or along the course in the cervical region. Left vertebral artery origin widely patent. Left vertebral artery tortuous but widely patent through the cervical region. Skeleton: Moderate cervical spondylosis. Other neck: No mass or lymphadenopathy. Upper chest: No active disease. Review of the MIP images confirms the above findings CTA HEAD FINDINGS Anterior circulation: Both internal carotid arteries are widely patent through the skullbase and siphon regions. Peripheral siphon atherosclerotic calcification but no stenosis. Anterior and middle cerebral vessels are patent without proximal stenosis, aneurysm or vascular malformation. Azygos anterior cerebral artery. Posterior circulation: Both vertebral arteries are widely patent to the basilar. No basilar stenosis. Posterior circulation branch vessels are normal. Venous sinuses: Patent and normal Anatomic variants: None Delayed phase: No abnormal  enhancement Review of the MIP images confirms the above findings IMPRESSION: Negative CT angiography for a person of this age. No sign of stroke since yesterday. No large or medium vessel stenosis or occlusion. Minimal non stenotic atherosclerotic change at the left carotid bifurcation. Electronically Signed   By: Nelson Chimes M.D.   On: 07/07/2017 18:53   Ct Angio Neck W Or Wo Contrast  Result Date: 07/07/2017 CLINICAL DATA:  Sudden onset of severe headache. Weakness beginning earlier today. Atrial fibrillation. EXAM: CT ANGIOGRAPHY HEAD AND NECK TECHNIQUE: Multidetector CT imaging of the head and neck was performed using the standard protocol during bolus administration of intravenous contrast. Multiplanar CT image reconstructions and MIPs were obtained to evaluate the vascular anatomy. Carotid stenosis measurements (when applicable) are obtained utilizing NASCET  criteria, using the distal internal carotid diameter as the denominator. CONTRAST:  50 cc Isovue 370 COMPARISON:  Head CT 07/06/2017. FINDINGS: CT HEAD FINDINGS Brain: No accelerated atrophy. Mild small vessel ischemic changes of the deep white matter. No sign of acute infarction, mass lesion, hemorrhage, hydrocephalus or extra-axial collection. Vascular: There is atherosclerotic calcification of the major vessels at the base of the brain. Skull: Negative Sinuses: Some ethmoid opacification.  Otherwise clear. Orbits: Orbits negative CTA NECK FINDINGS Aortic arch: Mild aortic atherosclerosis. No aneurysm or dissection. Right carotid system: Common carotid artery widely patent to the bifurcation. Right carotid bifurcation normal. Right ICA bulb normal. Tortuous cervical ICA without stenosis. Left carotid system: Common carotid artery widely patent to the bifurcation. Mild atherosclerotic calcification of the carotid bifurcation without stenosis or irregularity of the ICA bulb or cervical ICA. Vertebral arteries: Right vertebral artery dominant. No stenosis of the right vertebral artery origin or along the course in the cervical region. Left vertebral artery origin widely patent. Left vertebral artery tortuous but widely patent through the cervical region. Skeleton: Moderate cervical spondylosis. Other neck: No mass or lymphadenopathy. Upper chest: No active disease. Review of the MIP images confirms the above findings CTA HEAD FINDINGS Anterior circulation: Both internal carotid arteries are widely patent through the skullbase and siphon regions. Peripheral siphon atherosclerotic calcification but no stenosis. Anterior and middle cerebral vessels are patent without proximal stenosis, aneurysm or vascular malformation. Azygos anterior cerebral artery. Posterior circulation: Both vertebral arteries are widely patent to the basilar. No basilar stenosis. Posterior circulation branch vessels are normal. Venous sinuses:  Patent and normal Anatomic variants: None Delayed phase: No abnormal enhancement Review of the MIP images confirms the above findings IMPRESSION: Negative CT angiography for a person of this age. No sign of stroke since yesterday. No large or medium vessel stenosis or occlusion. Minimal non stenotic atherosclerotic change at the left carotid bifurcation. Electronically Signed   By: Nelson Chimes M.D.   On: 07/07/2017 18:53   Mr Jeri Cos [place] Contrast  Result Date: 07/08/2017 CLINICAL DATA:  Nausea, headache, slurred speech. Follow-up stroke. History of hypertension, hyperlipidemia. EXAM: MRI HEAD WITHOUT AND WITH CONTRAST TECHNIQUE: Multiplanar, multiecho pulse sequences of the brain and surrounding structures were obtained without and with intravenous contrast. CONTRAST:  44mL MULTIHANCE GADOBENATE DIMEGLUMINE 529 MG/ML IV SOLN COMPARISON:  CT HEAD July 06, 2017 FINDINGS: INTRACRANIAL CONTENTS: 1 cm RIGHT frontal operculum are focus of reduced diffusion, low ADC values. 5 mm focus reduced diffusion RIGHT occipital lobe with low ADC values. No susceptibility artifact to suggest hemorrhage. The ventricles and sulci are normal for patient's age. No suspicious parenchymal signal, masses, mass effect. A few punctate supratentorial white matter FLAIR T2 hyperintensities  compatible with mild chronic small vessel ischemic disease, less than expected for age. No abnormal intraparenchymal or extra-axial enhancement. No abnormal extra-axial fluid collections. No extra-axial masses. VASCULAR: Normal major intracranial vascular flow voids present at skull base. SKULL AND UPPER CERVICAL SPINE: No abnormal sellar expansion. No suspicious calvarial bone marrow signal. Craniocervical junction maintained. Severe degenerative change of the imaged cervical spine. SINUSES/ORBITS: Pan paranasal sinus mucosal thickening. Mastoid air cells are well aerated.The included ocular globes and orbital contents are non-suspicious. OTHER: None.  IMPRESSION: 1. 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 2. Otherwise negative MRI of the brain with and without contrast for age. Electronically Signed   By: Elon Alas M.D.   On: 07/08/2017 04:44    Scheduled Meds: . apixaban  5 mg Oral BID  . B-complex with vitamin C  1 tablet Oral Daily  . cholecalciferol  2,000 Units Oral Daily  . [START ON 07/09/2017] diltiazem  180 mg Oral Daily  . levothyroxine  100 mcg Oral QAC breakfast  . losartan  25 mg Oral Daily  . scopolamine  1 patch Transdermal Q72H   Continuous Infusions:   LOS: 2 days    Time spent: > 35 minutes  Velvet Bathe, MD [facility] Pager [phone]  If 7PM-7AM, please contact night-coverage [email or web address] Password [email or web address] 07/08/2017, 5:20 PM

## 2017-07-08 NOTE — Progress Notes (Signed)
CSW received consult stating "Pt on fixed income wants to start Eliquis for afib Please determine eligibility for coupons and what her co-pay would be."  CSW informed RNCM who assists with setting patients up on eliquis  CSW signing off  Jorge Ny, [place] Worker [phone]

## 2017-07-09 MED ORDER — APIXABAN 5 MG PO TABS: 5.0000 mg | ORAL_TABLET | Freq: Two times a day (BID) | ORAL | 0 refills | Status: DC

## 2017-07-09 MED ORDER — DILTIAZEM HCL ER COATED BEADS 240 MG PO CP24: 240.0000 mg | ORAL_CAPSULE | Freq: Every day | ORAL | 0 refills | Status: DC

## 2017-07-09 MED ORDER — LOSARTAN POTASSIUM 25 MG PO TABS: 25.0000 mg | ORAL_TABLET | Freq: Every day | ORAL | 0 refills | Status: DC

## 2017-07-09 NOTE — Discharge Summary (Signed)
Physician Discharge Summary  Carolyn Vazquez HFW:[id number] DOB: Apr 03, 1941 DOA: 07/06/2017  PCP: Carolyn Jordan, MD  Admit date: 07/06/2017 Discharge date: 07/09/2017  Time spent: > 35 minutes  Recommendations for Outpatient Follow-up:  1. Ensure patient follows up with cardiology and neurologist 2. Patient to follow-up with Dr. Erlinda Vazquez in 6 weeks   Discharge Diagnoses:  Active Problems:   TIA (transient ischemic attack)   Atrial fibrillation with rapid ventricular response ([place])   Hyperlipidemia   Discharge Condition: stable  Diet recommendation: Heart healthy  Filed Weights   07/06/17 1413 07/06/17 2145 07/08/17 2100  Weight: 68 kg (150 lb) 61.2 kg (135 lb) 67.8 kg (149 lb 6.4 oz)    History of present illness:  76 y.o. female with medical history significant for hypothyroidism hypertension, CKD, wageners granulomatosis, admitted with  Nausea headache, slurred speech and drooling of saliva.  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 2. Otherwise negative MRI of the brain with and without contrast for  Age. CTA H&N -Negative CT angiography for a person of this age. No sign of stroke since yesterday. No large or medium vessel stenosis or occlusion. Minimal non stenotic atherosclerotic change at the left carotid  Bifurcation. 2D Echo - Left ventricle: The cavity size was normal. Wall thickness was increased in a pattern of mild LVH. Systolic function was normal. The estimated ejection fraction was in the range of 50% to 55%. Wall motion was normal; there were no regional wall motion  abnormalities.  LDL - 124  HgbA1c - 5.7  VTE prophylaxis - Eliquis  Diet Heart Room service appropriate? Yes; Fluid consistency:  Thin  aspirin 81 mg daily prior to admission, now on   Eliquis.    Patient counseled to be compliant with her antithrombotic medications  Ongoing aggressive stroke risk factor management  Therapy recommendations:  HH PT  Disposition:  home   Wegener's granulomatosis  Follows with [facility] care  Was on cellcept and steroid in the past, now off  ESR and CRP negative this admission  Seems in remission  MRI brain with and without contrast pending to rule out any cerebritis/CNS vasculitis.   Hypertension  Blood pressure runs mildly low              Permissive hypertension (OK if < 180/105) but gradually normalize in 5-7 days              Long-term BP goal normotensive  Hyperlipidemia  Home meds:  No lipid lowering medications prior to admission  LDL 124, goal < 70  Reported allergy to Lipitor and Zocor  Consider outpt follow up with PCP or cardiology to consider PCSK9 inhibitors  Other Stroke Risk Factors  Advanced age  ETOH use, advised to drink no more than 1 drink per day  Family hx stroke (mother and father)  Other [facility] day # 2 I have personally examined this patient, reviewed notes, independently viewed imaging studies, participated in medical decision making and plan of care.ROS completed by me personally and pertinent positives fully documented  I have made any additions or clarifications directly to the above note. Recommend eliquis for anticoagulation and aggressive risk factor modification. Greater than 50% time during this 25 minute visit was spent on counseling  and coordination of care about her embolic strokes, risks factor modification and prevention discussion. Follow-up as an outpatient in stroke clinic with Dr. Erlinda Vazquez in 6 weeks.   Procedures: Ct Head W & Wo Contrast 07/06/2017 No acute finding by CT. Minimal small vessel change of the hemispheric white matter. No abnormal contrast enhancement.   MR Brain W & Wo Contrast -  1. 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 2. Otherwise negative MRI of the brain with and without contrast for age. CTA Head and Neck -Negative CT angiography for a person of this age. No sign of stroke since yesterday. No large or medium vessel stenosis or occlusion. Minimal non stenotic atherosclerotic change at the left carotid bifurcation. TTE Left ventricle: The cavity size was normal. Wall thickness was increased in a pattern of mild LVH. Systolic function was normal. The estimated ejection fraction was in the range of 50% to 55%. Wall motion was normal; there were no regional wall motion abnormalities.  Atrial fibrillation - Cardiology consulted and patient placed on diltiazem and eliquis Please see dosages below.  Consultations:  Cardiology   Neurology  Discharge Exam: Vitals:   07/09/17 0857 07/09/17 1405  BP: 115/84 100/64  Pulse: 88 86  Resp: 20 20  Temp: 98 F (36.7 C) 98.2 F (36.8 C)  SpO2: 96% 96%    General: Pt in nad, alert and awake Cardiovascular: s1 and s2 present, no murmurs or rubs Respiratory: no increased wob, no wheezes  Discharge Instructions   Discharge Instructions    Call MD for:  difficulty breathing, headache or visual disturbances    Complete by:  As directed    Call MD for:  temperature >100.4    Complete by:  As directed    Diet - low sodium heart healthy    Complete by:  As directed    Discharge instructions    Complete by:  As directed    Please follow up with your cardiologist and neurologist.   Increase activity slowly    Complete by:  As directed      Current Discharge Medication List    START taking these medications   Details  apixaban (ELIQUIS) 5 MG TABS tablet Take 1 tablet (5 mg total) by mouth 2 (two) times daily. Qty: 60 tablet, Refills: 0    diltiazem (CARDIZEM CD) 240 MG 24 hr capsule Take 1 capsule (240 mg total) by mouth daily. Qty: 30 capsule, Refills: 0      CONTINUE  these medications which have CHANGED   Details  losartan (COZAAR) 25 MG tablet Take 1 tablet (25 mg total) by mouth daily. Qty: 30 tablet, Refills: 0      CONTINUE these medications which have NOT CHANGED   Details  acetaminophen (TYLENOL) 325 MG tablet Take 325 mg by mouth daily as needed. Mild pain    ascorbic acid (VITAMIN C) 1000 MG tablet Take 1,000 mg by mouth daily.    B Complex Vitamins (B-COMPLEX/B-12) TABS Take 1 tablet by mouth daily.     Calcium Carbonate Antacid (TUMS PO) Take 1 tablet by mouth daily.    Cholecalciferol (VITAMIN D3) 1000 UNITS CAPS Take 2,000 mg by mouth daily.    fexofenadine (ALLEGRA) 180 MG tablet Take 180 mg by mouth daily.    levothyroxine (SYNTHROID) 100 MCG tablet Take 1 tablet (100 mcg total) by mouth daily before breakfast.    Multiple Vitamins-Minerals (ICAPS) CAPS Take 1 tablet by mouth 2 (two) times daily.  STOP taking these medications     amLODipine (NORVASC) 2.5 MG tablet      aspirin 81 MG tablet      furosemide (LASIX) 20 MG tablet        Allergies  Allergen Reactions  . Codeine Anaphylaxis  . Atorvastatin Other (See Comments)    Depression   . Penicillins Rash    Happened in 1960s Has patient had a PCN reaction causing immediate rash, facial/tongue/throat swelling, SOB or lightheadedness with hypotension: Unknown Has patient had a PCN reaction causing severe rash involving mucus membranes or skin necrosis: Unknown Has patient had a PCN reaction that required hospitalization: Unknown Has patient had a PCN reaction occurring within the last 10 years: No If all of the above answers are "NO", then may proceed with Cephalosporin use.   . Simvastatin Other (See Comments)    JOINT PAIN      The results of significant diagnostics from this hospitalization (including imaging, microbiology, ancillary and laboratory) are listed below for reference.    Significant Diagnostic Studies: Ct Angio Head W Or Wo  Contrast  Result Date: 07/07/2017 CLINICAL DATA:  Sudden onset of severe headache. Weakness beginning earlier today. Atrial fibrillation. EXAM: CT ANGIOGRAPHY HEAD AND NECK TECHNIQUE: Multidetector CT imaging of the head and neck was performed using the standard protocol during bolus administration of intravenous contrast. Multiplanar CT image reconstructions and MIPs were obtained to evaluate the vascular anatomy. Carotid stenosis measurements (when applicable) are obtained utilizing NASCET criteria, using the distal internal carotid diameter as the denominator. CONTRAST:  50 cc Isovue 370 COMPARISON:  Head CT 07/06/2017. FINDINGS: CT HEAD FINDINGS Brain: No accelerated atrophy. Mild small vessel ischemic changes of the deep white matter. No sign of acute infarction, mass lesion, hemorrhage, hydrocephalus or extra-axial collection. Vascular: There is atherosclerotic calcification of the major vessels at the base of the brain. Skull: Negative Sinuses: Some ethmoid opacification.  Otherwise clear. Orbits: Orbits negative CTA NECK FINDINGS Aortic arch: Mild aortic atherosclerosis. No aneurysm or dissection. Right carotid system: Common carotid artery widely patent to the bifurcation. Right carotid bifurcation normal. Right ICA bulb normal. Tortuous cervical ICA without stenosis. Left carotid system: Common carotid artery widely patent to the bifurcation. Mild atherosclerotic calcification of the carotid bifurcation without stenosis or irregularity of the ICA bulb or cervical ICA. Vertebral arteries: Right vertebral artery dominant. No stenosis of the right vertebral artery origin or along the course in the cervical region. Left vertebral artery origin widely patent. Left vertebral artery tortuous but widely patent through the cervical region. Skeleton: Moderate cervical spondylosis. Other neck: No mass or lymphadenopathy. Upper chest: No active disease. Review of the MIP images confirms the above findings CTA HEAD  FINDINGS Anterior circulation: Both internal carotid arteries are widely patent through the skullbase and siphon regions. Peripheral siphon atherosclerotic calcification but no stenosis. Anterior and middle cerebral vessels are patent without proximal stenosis, aneurysm or vascular malformation. Azygos anterior cerebral artery. Posterior circulation: Both vertebral arteries are widely patent to the basilar. No basilar stenosis. Posterior circulation branch vessels are normal. Venous sinuses: Patent and normal Anatomic variants: None Delayed phase: No abnormal enhancement Review of the MIP images confirms the above findings IMPRESSION: Negative CT angiography for a person of this age. No sign of stroke since yesterday. No large or medium vessel stenosis or occlusion. Minimal non stenotic atherosclerotic change at the left carotid bifurcation. Electronically Signed   By: Nelson Chimes M.D.   On: 07/07/2017 18:53   Ct Head W &  Wo Contrast  Result Date: 07/06/2017 CLINICAL DATA:  Sudden onset of severe headache. Recent diagnosis of atrial fibrillation. EXAM: CT HEAD WITHOUT AND WITH CONTRAST TECHNIQUE: Contiguous axial images were obtained from the base of the skull through the vertex without and with intravenous contrast CONTRAST:  63m ISOVUE-300 IOPAMIDOL (ISOVUE-300) INJECTION 61% COMPARISON:  None. FINDINGS: Brain: Mild age related volume loss. The brainstem and cerebellum appear normal. Cerebral hemispheres show minimal areas of low-density in the deep white matter consistent with mild small vessel change. No sign of acute infarction, mass lesion, hemorrhage, hydrocephalus or extra-axial collection. No abnormal enhancement. Vascular: There is atherosclerotic calcification of the major vessels at the base of the brain. No abnormal enhancement. No abnormal vascular finding. Skull: Normal Sinuses/Orbits: Minimal mucosal thickening of the paranasal sinuses, possibly subclinical. Orbits negative. Other: None  IMPRESSION: No acute finding by CT. Minimal small vessel change of the hemispheric white matter. No abnormal contrast enhancement. Electronically Signed   By: MNelson ChimesM.D.   On: 07/06/2017 15:09   Ct Angio Neck W Or Wo Contrast  Result Date: 07/07/2017 CLINICAL DATA:  Sudden onset of severe headache. Weakness beginning earlier today. Atrial fibrillation. EXAM: CT ANGIOGRAPHY HEAD AND NECK TECHNIQUE: Multidetector CT imaging of the head and neck was performed using the standard protocol during bolus administration of intravenous contrast. Multiplanar CT image reconstructions and MIPs were obtained to evaluate the vascular anatomy. Carotid stenosis measurements (when applicable) are obtained utilizing NASCET criteria, using the distal internal carotid diameter as the denominator. CONTRAST:  50 cc Isovue 370 COMPARISON:  Head CT 07/06/2017. FINDINGS: CT HEAD FINDINGS Brain: No accelerated atrophy. Mild small vessel ischemic changes of the deep white matter. No sign of acute infarction, mass lesion, hemorrhage, hydrocephalus or extra-axial collection. Vascular: There is atherosclerotic calcification of the major vessels at the base of the brain. Skull: Negative Sinuses: Some ethmoid opacification.  Otherwise clear. Orbits: Orbits negative CTA NECK FINDINGS Aortic arch: Mild aortic atherosclerosis. No aneurysm or dissection. Right carotid system: Common carotid artery widely patent to the bifurcation. Right carotid bifurcation normal. Right ICA bulb normal. Tortuous cervical ICA without stenosis. Left carotid system: Common carotid artery widely patent to the bifurcation. Mild atherosclerotic calcification of the carotid bifurcation without stenosis or irregularity of the ICA bulb or cervical ICA. Vertebral arteries: Right vertebral artery dominant. No stenosis of the right vertebral artery origin or along the course in the cervical region. Left vertebral artery origin widely patent. Left vertebral artery  tortuous but widely patent through the cervical region. Skeleton: Moderate cervical spondylosis. Other neck: No mass or lymphadenopathy. Upper chest: No active disease. Review of the MIP images confirms the above findings CTA HEAD FINDINGS Anterior circulation: Both internal carotid arteries are widely patent through the skullbase and siphon regions. Peripheral siphon atherosclerotic calcification but no stenosis. Anterior and middle cerebral vessels are patent without proximal stenosis, aneurysm or vascular malformation. Azygos anterior cerebral artery. Posterior circulation: Both vertebral arteries are widely patent to the basilar. No basilar stenosis. Posterior circulation branch vessels are normal. Venous sinuses: Patent and normal Anatomic variants: None Delayed phase: No abnormal enhancement Review of the MIP images confirms the above findings IMPRESSION: Negative CT angiography for a person of this age. No sign of stroke since yesterday. No large or medium vessel stenosis or occlusion. Minimal non stenotic atherosclerotic change at the left carotid bifurcation. Electronically Signed   By: MNelson ChimesM.D.   On: 07/07/2017 18:53   Mr BJeri CosWQVContrast  Result  Date: 07/08/2017 CLINICAL DATA:  Nausea, headache, slurred speech. Follow-up stroke. History of hypertension, hyperlipidemia. EXAM: MRI HEAD WITHOUT AND WITH CONTRAST TECHNIQUE: Multiplanar, multiecho pulse sequences of the brain and surrounding structures were obtained without and with intravenous contrast. CONTRAST:  53m MULTIHANCE GADOBENATE DIMEGLUMINE 529 MG/ML IV SOLN COMPARISON:  CT HEAD July 06, 2017 FINDINGS: INTRACRANIAL CONTENTS: 1 cm RIGHT frontal operculum are focus of reduced diffusion, low ADC values. 5 mm focus reduced diffusion RIGHT occipital lobe with low ADC values. No susceptibility artifact to suggest hemorrhage. The ventricles and sulci are normal for patient's age. No suspicious parenchymal signal, masses, mass effect. A  few punctate supratentorial white matter FLAIR T2 hyperintensities compatible with mild chronic small vessel ischemic disease, less than expected for age. No abnormal intraparenchymal or extra-axial enhancement. No abnormal extra-axial fluid collections. No extra-axial masses. VASCULAR: Normal major intracranial vascular flow voids present at skull base. SKULL AND UPPER CERVICAL SPINE: No abnormal sellar expansion. No suspicious calvarial bone marrow signal. Craniocervical junction maintained. Severe degenerative change of the imaged cervical spine. SINUSES/ORBITS: Pan paranasal sinus mucosal thickening. Mastoid air cells are well aerated.The included ocular globes and orbital contents are non-suspicious. OTHER: None. IMPRESSION: 1. 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 2. Otherwise negative MRI of the brain with and without contrast for age. Electronically Signed   By: CElon AlasM.D.   On: 07/08/2017 04:44    Microbiology: Recent Results (from the past 240 hour(s))  MRSA PCR Screening     Status: None   Collection Time: 07/06/17 10:07 PM  Result Value Ref Range Status   MRSA by PCR NEGATIVE NEGATIVE Final    Comment:        The GeneXpert MRSA Assay (FDA approved for NASAL specimens only), is one component of a comprehensive MRSA colonization surveillance program. It is not intended to diagnose MRSA infection nor to guide or monitor treatment for MRSA infections.      Labs: Basic Metabolic Panel:  Recent Labs Lab 07/06/17 1407 07/06/17 1433 07/07/17 0413  NA 142 142 139  K 3.6 3.6 3.9  CL 104 103 104  CO2 26  --  25  GLUCOSE 148* 148* 118*  BUN 10 9 <5*  CREATININE 0.81 0.90 0.72  CALCIUM 9.8  --  8.7*   Liver Function Tests:  Recent Labs Lab 07/06/17 1407  AST 33  ALT 23  ALKPHOS 94  BILITOT 0.9  PROT 7.7  ALBUMIN 4.3   No results for input(s): LIPASE, AMYLASE in the last 168 hours. No results for input(s): AMMONIA in the last 168  hours. CBC:  Recent Labs Lab 07/06/17 1407 07/06/17 1433 07/07/17 0413  WBC 8.8  --  9.3  NEUTROABS 5.0  --  6.0  HGB 14.8 15.0 13.6  HCT 42.5 44.0 41.6  MCV 91.6  --  92.0  PLT 292  --  278   Cardiac Enzymes: No results for input(s): CKTOTAL, CKMB, CKMBINDEX, TROPONINI in the last 168 hours. BNP: BNP (last 3 results) No results for input(s): BNP in the last 8760 hours.  ProBNP (last 3 results) No results for input(s): PROBNP in the last 8760 hours.  CBG: No results for input(s): GLUCAP in the last 168 hours.   Signed:  VVelvet BatheMD.  [facility] 07/09/2017, 5:37 PM

## 2017-07-09 NOTE — Progress Notes (Signed)
Progress Note  Patient Name: Carolyn Vazquez Date of Encounter: 07/09/2017  Primary Cardiologist: Dr Meda Coffee  Subjective   Feeling well today. No palpitations, CP, or dyspnea.   Inpatient Medications    Scheduled Meds: . apixaban  5 mg Oral BID  . B-complex with vitamin C  1 tablet Oral Daily  . cholecalciferol  2,000 Units Oral Daily  . diltiazem  240 mg Oral Daily  . levothyroxine  100 mcg Oral QAC breakfast  . losartan  25 mg Oral Daily  . scopolamine  1 patch Transdermal Q72H   Continuous Infusions:  PRN Meds: acetaminophen **OR** acetaminophen (TYLENOL) oral liquid 160 mg/5 mL **OR** acetaminophen, promethazine, senna   Vital Signs    Vitals:   07/08/17 2100 07/09/17 0128 07/09/17 0442 07/09/17 0857  BP: 140/85 110/66 130/71 115/84  Pulse: (!) 115 97 77 88  Resp: 20 20 20 20   Temp: 98.2 F (36.8 C) 98.2 F (36.8 C) 98 F (36.7 C) 98 F (36.7 C)  TempSrc: Oral Oral Oral Oral  SpO2: 98% 97% 98% 96%  Weight: 149 lb 6.4 oz (67.8 kg)     Height: 5' 1.5" (1.562 m)       Intake/Output Summary (Last 24 hours) at 07/09/17 1153 Last data filed at 07/09/17 0858  Gross per 24 hour  Intake              480 ml  Output                0 ml  Net              480 ml   Filed Weights   07/06/17 1413 07/06/17 2145 07/08/17 2100  Weight: 150 lb (68 kg) 135 lb (61.2 kg) 149 lb 6.4 oz (67.8 kg)    Telemetry    Atrial fibrillation HR 90's - Personally Reviewed   Physical Exam  Alert, oriented woman in NAD GEN: No acute distress.   Neck: No JVD Cardiac: irregularly irregular, no murmurs, rubs, or gallops.  Respiratory: Clear to auscultation bilaterally. GI: Soft, nontender, non-distended  MS: No edema; No deformity. Neuro:  Nonfocal  Psych: Normal affect   Labs    Chemistry Recent Labs Lab 07/06/17 1407 07/06/17 1433 07/07/17 0413  NA 142 142 139  K 3.6 3.6 3.9  CL 104 103 104  CO2 26  --  25  GLUCOSE 148* 148* 118*  BUN 10 9 <5*  CREATININE 0.81  0.90 0.72  CALCIUM 9.8  --  8.7*  PROT 7.7  --   --   ALBUMIN 4.3  --   --   AST 33  --   --   ALT 23  --   --   ALKPHOS 94  --   --   BILITOT 0.9  --   --   GFRNONAA >60  --  >60  GFRAA >60  --  >60  ANIONGAP 12  --  10     Hematology Recent Labs Lab 07/06/17 1407 07/06/17 1433 07/07/17 0413  WBC 8.8  --  9.3  RBC 4.64  --  4.52  HGB 14.8 15.0 13.6  HCT 42.5 44.0 41.6  MCV 91.6  --  92.0  MCH 31.9  --  30.1  MCHC 34.8  --  32.7  RDW 12.4  --  12.3  PLT 292  --  278    Cardiac EnzymesNo results for input(s): TROPONINI in the last 168 hours. No results for input(s): TROPIPOC  in the last 168 hours.   BNPNo results for input(s): BNP, PROBNP in the last 168 hours.   DDimer No results for input(s): DDIMER in the last 168 hours.   Radiology    Ct Angio Head W Or Wo Contrast  Result Date: 07/07/2017 CLINICAL DATA:  Sudden onset of severe headache. Weakness beginning earlier today. Atrial fibrillation. EXAM: CT ANGIOGRAPHY HEAD AND NECK TECHNIQUE: Multidetector CT imaging of the head and neck was performed using the standard protocol during bolus administration of intravenous contrast. Multiplanar CT image reconstructions and MIPs were obtained to evaluate the vascular anatomy. Carotid stenosis measurements (when applicable) are obtained utilizing NASCET criteria, using the distal internal carotid diameter as the denominator. CONTRAST:  50 cc Isovue 370 COMPARISON:  Head CT 07/06/2017. FINDINGS: CT HEAD FINDINGS Brain: No accelerated atrophy. Mild small vessel ischemic changes of the deep white matter. No sign of acute infarction, mass lesion, hemorrhage, hydrocephalus or extra-axial collection. Vascular: There is atherosclerotic calcification of the major vessels at the base of the brain. Skull: Negative Sinuses: Some ethmoid opacification.  Otherwise clear. Orbits: Orbits negative CTA NECK FINDINGS Aortic arch: Mild aortic atherosclerosis. No aneurysm or dissection. Right carotid  system: Common carotid artery widely patent to the bifurcation. Right carotid bifurcation normal. Right ICA bulb normal. Tortuous cervical ICA without stenosis. Left carotid system: Common carotid artery widely patent to the bifurcation. Mild atherosclerotic calcification of the carotid bifurcation without stenosis or irregularity of the ICA bulb or cervical ICA. Vertebral arteries: Right vertebral artery dominant. No stenosis of the right vertebral artery origin or along the course in the cervical region. Left vertebral artery origin widely patent. Left vertebral artery tortuous but widely patent through the cervical region. Skeleton: Moderate cervical spondylosis. Other neck: No mass or lymphadenopathy. Upper chest: No active disease. Review of the MIP images confirms the above findings CTA HEAD FINDINGS Anterior circulation: Both internal carotid arteries are widely patent through the skullbase and siphon regions. Peripheral siphon atherosclerotic calcification but no stenosis. Anterior and middle cerebral vessels are patent without proximal stenosis, aneurysm or vascular malformation. Azygos anterior cerebral artery. Posterior circulation: Both vertebral arteries are widely patent to the basilar. No basilar stenosis. Posterior circulation branch vessels are normal. Venous sinuses: Patent and normal Anatomic variants: None Delayed phase: No abnormal enhancement Review of the MIP images confirms the above findings IMPRESSION: Negative CT angiography for a person of this age. No sign of stroke since yesterday. No large or medium vessel stenosis or occlusion. Minimal non stenotic atherosclerotic change at the left carotid bifurcation. Electronically Signed   By: Nelson Chimes M.D.   On: 07/07/2017 18:53   Ct Angio Neck W Or Wo Contrast  Result Date: 07/07/2017 CLINICAL DATA:  Sudden onset of severe headache. Weakness beginning earlier today. Atrial fibrillation. EXAM: CT ANGIOGRAPHY HEAD AND NECK TECHNIQUE:  Multidetector CT imaging of the head and neck was performed using the standard protocol during bolus administration of intravenous contrast. Multiplanar CT image reconstructions and MIPs were obtained to evaluate the vascular anatomy. Carotid stenosis measurements (when applicable) are obtained utilizing NASCET criteria, using the distal internal carotid diameter as the denominator. CONTRAST:  50 cc Isovue 370 COMPARISON:  Head CT 07/06/2017. FINDINGS: CT HEAD FINDINGS Brain: No accelerated atrophy. Mild small vessel ischemic changes of the deep white matter. No sign of acute infarction, mass lesion, hemorrhage, hydrocephalus or extra-axial collection. Vascular: There is atherosclerotic calcification of the major vessels at the base of the brain. Skull: Negative Sinuses: Some ethmoid opacification.  Otherwise clear. Orbits: Orbits negative CTA NECK FINDINGS Aortic arch: Mild aortic atherosclerosis. No aneurysm or dissection. Right carotid system: Common carotid artery widely patent to the bifurcation. Right carotid bifurcation normal. Right ICA bulb normal. Tortuous cervical ICA without stenosis. Left carotid system: Common carotid artery widely patent to the bifurcation. Mild atherosclerotic calcification of the carotid bifurcation without stenosis or irregularity of the ICA bulb or cervical ICA. Vertebral arteries: Right vertebral artery dominant. No stenosis of the right vertebral artery origin or along the course in the cervical region. Left vertebral artery origin widely patent. Left vertebral artery tortuous but widely patent through the cervical region. Skeleton: Moderate cervical spondylosis. Other neck: No mass or lymphadenopathy. Upper chest: No active disease. Review of the MIP images confirms the above findings CTA HEAD FINDINGS Anterior circulation: Both internal carotid arteries are widely patent through the skullbase and siphon regions. Peripheral siphon atherosclerotic calcification but no stenosis.  Anterior and middle cerebral vessels are patent without proximal stenosis, aneurysm or vascular malformation. Azygos anterior cerebral artery. Posterior circulation: Both vertebral arteries are widely patent to the basilar. No basilar stenosis. Posterior circulation branch vessels are normal. Venous sinuses: Patent and normal Anatomic variants: None Delayed phase: No abnormal enhancement Review of the MIP images confirms the above findings IMPRESSION: Negative CT angiography for a person of this age. No sign of stroke since yesterday. No large or medium vessel stenosis or occlusion. Minimal non stenotic atherosclerotic change at the left carotid bifurcation. Electronically Signed   By: Nelson Chimes M.D.   On: 07/07/2017 18:53   Mr Jeri Cos SE Contrast  Result Date: 07/08/2017 CLINICAL DATA:  Nausea, headache, slurred speech. Follow-up stroke. History of hypertension, hyperlipidemia. EXAM: MRI HEAD WITHOUT AND WITH CONTRAST TECHNIQUE: Multiplanar, multiecho pulse sequences of the brain and surrounding structures were obtained without and with intravenous contrast. CONTRAST:  67mL MULTIHANCE GADOBENATE DIMEGLUMINE 529 MG/ML IV SOLN COMPARISON:  CT HEAD July 06, 2017 FINDINGS: INTRACRANIAL CONTENTS: 1 cm RIGHT frontal operculum are focus of reduced diffusion, low ADC values. 5 mm focus reduced diffusion RIGHT occipital lobe with low ADC values. No susceptibility artifact to suggest hemorrhage. The ventricles and sulci are normal for patient's age. No suspicious parenchymal signal, masses, mass effect. A few punctate supratentorial white matter FLAIR T2 hyperintensities compatible with mild chronic small vessel ischemic disease, less than expected for age. No abnormal intraparenchymal or extra-axial enhancement. No abnormal extra-axial fluid collections. No extra-axial masses. VASCULAR: Normal major intracranial vascular flow voids present at skull base. SKULL AND UPPER CERVICAL SPINE: No abnormal sellar expansion.  No suspicious calvarial bone marrow signal. Craniocervical junction maintained. Severe degenerative change of the imaged cervical spine. SINUSES/ORBITS: Pan paranasal sinus mucosal thickening. Mastoid air cells are well aerated.The included ocular globes and orbital contents are non-suspicious. OTHER: None. IMPRESSION: 1. 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 2. Otherwise negative MRI of the brain with and without contrast for age. Electronically Signed   By: Elon Alas M.D.   On: 07/08/2017 04:44    Cardiac Studies   2D Echo 07/07/2017: Study Conclusions  - Left ventricle: The cavity size was normal. Wall thickness was   increased in a pattern of mild LVH. Systolic function was normal.   The estimated ejection fraction was in the range of 50% to 55%.   Wall motion was normal; there were no regional wall motion   abnormalities. - Ascending aorta: The ascending aorta was mildly dilated. - Mitral valve: Calcified annulus. There was mild  regurgitation. - Left atrium: The atrium was mildly dilated. - Pulmonary arteries: Systolic pressure was mildly increased. PA   peak pressure: 32 mm Hg (S).  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  Patient Profile     76 y.o. female PMH of HTN, HL, Wegeners, new onset AFIB, and Stroke who presented with Afib.  Assessment & Plan    1. 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 Continue eliquis for anticoagulation and diltiazem CD 240 mg for rate-control.  HR is improved today. Close office FU could consider cardioversion after 3 weeks of anticoagulation.  2. Stroke: per neuro and primary team. Seems to be recovering well.  3. HTN: BP stable and well-controlled.   Dispo: home today. Will arrange cardiology FU.  Deatra James,  MD  07/09/2017, 11:53 AM

## 2017-07-09 NOTE — Care Management (Signed)
ELIQUIS 5 MG BID   COVER- YES  CO-PAY- $ 45.00  TIER- 3 DRUG  PRIOR APPROVAL- NO  DEDUCTIBLE-NO   APIXABAN: NON FORMULARY   PHARMACY : [facility]  [facility] : MAIL-ORDER FOR 90 DAYS SUPPLY $ 45.00

## 2017-07-09 NOTE — Progress Notes (Signed)
Occupational Therapy Treatment Patient Details Name: Carolyn Vazquez MRN: [id number] DOB: 1941/05/21 Today's Date: 07/09/2017    History of present illness Patient is a 76 y/o female who presents with nausea, headache, slurred speech, dysphagia and drooling. Head CT-unremarkable. MRi- right frontal and occipital infarcts. Found to have A-fib with RVR in ED. PMH includes HTN, CKD, wegnersgranulomatosis,   OT comments  Pt demonstrating progress toward OT goals. She continues to demonstrate decreased awareness and safety as task demands and distractions increase. She was able to complete pathfinding task in hallway with minimal verbal cues and demonstrated LOB x1 when completing higher level cognitive tasks while ambulating to simulate IADL. Educated pt concerning compensatory strategies for medication and financial management tasks as well as need to decrease distractions to improve attention during ADL and IADL participation. D/C recommendation remains appropriate.  Follow Up Recommendations  Home health OT    Equipment Recommendations  Tub/shower seat    Recommendations for Other Services      Precautions / Restrictions Precautions Precautions: Fall Precaution Comments: watch HR Restrictions Weight Bearing Restrictions: No       Mobility Bed Mobility               General bed mobility comments: Pt OOB in chair on arrival.   Transfers Overall transfer level: Needs assistance Equipment used: None Transfers: Sit to/from Stand Sit to Stand: Supervision         General transfer comment: supervision for safety    Balance Overall balance assessment: Needs assistance Sitting-balance support: Feet supported;No upper extremity supported Sitting balance-Leahy Scale: Good Sitting balance - Comments: Able to donn/doff socks sitting EOB without difficulty.    Standing balance support: During functional activity Standing balance-Leahy Scale: Good Standing balance comment:  Supervision for safety with dynamic tasks. When completing cognitive tasks in hallway did demonstrate LOB x1.                 Standardized Balance Assessment Standardized Balance Assessment : Dynamic Gait Index   Dynamic Gait Index Level Surface: Mild Impairment Change in Gait Speed: Mild Impairment Gait with Horizontal Head Turns: Moderate Impairment Gait with Vertical Head Turns: Mild Impairment Gait and Pivot Turn: Normal Step Over Obstacle: Mild Impairment Step Around Obstacles: Mild Impairment Steps: Mild Impairment Total Score: 16     ADL either performed or assessed with clinical judgement   ADL Overall ADL's : Needs assistance/impaired                         Toilet Transfer: Supervision/safety;Ambulation;Regular Toilet       Tub/ Banker: Supervision/safety;Grab bars;Ambulation;Tub transfer Tub/Shower Transfer Details (indicate cue type and reason): Pt declines shower seat Functional mobility during ADLs: Supervision/safety General ADL Comments: Primary limitations due to higher level cognitive deficits.      Vision       Perception     Praxis      Cognition Arousal/Alertness: Awake/alert Behavior During Therapy: WFL for tasks assessed/performed Overall Cognitive Status: Within Functional Limits for tasks assessed Area of Impairment: Awareness                           Awareness: Emergent   General Comments: As task demands increase, pt with decreasing balance due to poor ability to alternate attention. Decreased mental math skills related to financial management. Educated pt on financial and medication management compensatory strategies.         Exercises  Shoulder Instructions       General Comments      Pertinent Vitals/ Pain       Pain Assessment: No/denies pain  Home Living                                          Prior Functioning/Environment              Frequency  Min  2X/week        Progress Toward Goals  OT Goals(current goals can now be found in the care plan section)  Progress towards OT goals: Progressing toward goals  Acute Rehab OT Goals Patient Stated Goal: to feel better OT Goal Formulation: With patient Time For Goal Achievement: 07/22/17 Potential to Achieve Goals: Good  Plan Discharge plan remains appropriate    Co-evaluation                 AM-PAC PT "6 Clicks" Daily Activity     Outcome Measure   Help from another person eating meals?: None Help from another person taking care of personal grooming?: A Little Help from another person toileting, which includes using toliet, bedpan, or urinal?: A Little Help from another person bathing (including washing, rinsing, drying)?: A Little Help from another person to put on and taking off regular upper body clothing?: None Help from another person to put on and taking off regular lower body clothing?: A Little 6 Click Score: 20    End of Session Equipment Utilized During Treatment: Gait belt  OT Visit Diagnosis: Unsteadiness on feet (R26.81);Other symptoms and signs involving cognitive function;Low vision, both eyes (H54.2)   Activity Tolerance Patient tolerated treatment well   Patient Left in chair;with call bell/phone within reach   Nurse Communication          Time: 1546-1610 OT Time Calculation (min): 24 min  Charges: OT General Charges $OT Visit: 1 Procedure OT Treatments $Self Care/Home Management : 8-22 mins $Therapeutic Activity: 8-22 mins  Norman Herrlich, MS OTR/L  Pager: [place] 07/09/2017, 5:00 PM

## 2017-07-09 NOTE — Discharge Instructions (Signed)

## 2017-07-09 NOTE — Progress Notes (Signed)
Physical Therapy Treatment Patient Details Name: Carolyn Vazquez MRN: [id number] DOB: 1941-09-28 Today's Date: 07/09/2017    History of Present Illness Patient is a 76 y/o female who presents with nausea, headache, slurred speech, dysphagia and drooling. Head CT-unremarkable. MRi- right frontal and occipital infarcts. Found to have A-fib with RVR in ED. PMH includes HTN, CKD, wegnersgranulomatosis,    PT Comments    Patient continues to progress toward mobility goals. Pt does demonstrate slight L side weakness and unsteady with high level balance activities scoring 16/24 on DGI. Current plan remains appropriate.    Follow Up Recommendations  Home health PT     Equipment Recommendations  None recommended by PT    Recommendations for Other Services       Precautions / Restrictions Precautions Precautions: Fall Precaution Comments: watch HR Restrictions Weight Bearing Restrictions: No    Mobility  Bed Mobility               General bed mobility comments: pt OOB in chair upon arrival  Transfers Overall transfer level: Needs assistance Equipment used: None Transfers: Sit to/from Stand Sit to Stand: Supervision         General transfer comment: supervision for safety  Ambulation/Gait Ambulation/Gait assistance: Min guard Ambulation Distance (Feet): 250 Feet Assistive device: None Gait Pattern/deviations: Step-through pattern;Decreased stride length Gait velocity: decreased   General Gait Details: cues for cadence; grossly steady gait with unsteadiness noted with horizontal head turn toward R side   Stairs Stairs: Yes   Stair Management: One rail Right;Alternating pattern;Forwards Number of Stairs:  (2 steps then 3 steps) General stair comments: min guard for safety  Wheelchair Mobility    Modified Rankin (Stroke Patients Only) Modified Rankin (Stroke Patients Only) Modified Rankin: Moderate disability     Balance Overall balance assessment:  Needs assistance Sitting-balance support: Feet supported;No upper extremity supported Sitting balance-Leahy Scale: Good       Standing balance-Leahy Scale: Good                   Standardized Balance Assessment Standardized Balance Assessment : Dynamic Gait Index   Dynamic Gait Index Level Surface: Mild Impairment Change in Gait Speed: Mild Impairment Gait with Horizontal Head Turns: Moderate Impairment Gait with Vertical Head Turns: Mild Impairment Gait and Pivot Turn: Normal Step Over Obstacle: Mild Impairment Step Around Obstacles: Mild Impairment Steps: Mild Impairment Total Score: 16      Cognition Arousal/Alertness: Awake/alert Behavior During Therapy: WFL for tasks assessed/performed Overall Cognitive Status: Within Functional Limits for tasks assessed                                        Exercises      General Comments        Pertinent Vitals/Pain Pain Assessment: No/denies pain    Home Living                      Prior Function            PT Goals (current goals can now be found in the care plan section) Progress towards PT goals: Progressing toward goals    Frequency    Min 4X/week      PT Plan Current plan remains appropriate    Co-evaluation              AM-PAC PT "6 Clicks" Daily Activity  Outcome  Measure  Difficulty turning over in bed (including adjusting bedclothes, sheets and blankets)?: None Difficulty moving from lying on back to sitting on the side of the bed? : None Difficulty sitting down on and standing up from a chair with arms (e.g., wheelchair, bedside commode, etc,.)?: None Help needed moving to and from a bed to chair (including a wheelchair)?: None Help needed walking in hospital room?: A Little Help needed climbing 3-5 steps with a railing? : A Little 6 Click Score: 22    End of Session Equipment Utilized During Treatment: Gait belt Activity Tolerance: Patient tolerated  treatment well Patient left: in chair;with call bell/phone within reach Nurse Communication: Mobility status PT Visit Diagnosis: Unsteadiness on feet (R26.81);Difficulty in walking, not elsewhere classified (R26.2)     Time: 0712-1975 PT Time Calculation (min) (ACUTE ONLY): 20 min  Charges:  $Gait Training: 8-22 mins                    G Codes:       Earney Navy, PTA Pager: [phone]     Darliss Cheney 07/09/2017, 3:44 PM

## 2017-07-09 NOTE — Care Management Note (Signed)
Case Management Note  Patient Details  Name: Carolyn Vazquez MRN: [id number] Date of Birth: [birth year]  Subjective/Objective:                    Action/Plan: PT/OT recommending [place] services. CM spoke to Dr Wendee Beavers and he was in agreement. CM provided the patient with a list of [place] agencies. She selected [facility]. Tim with [facility] notified.  Pt informed of co pay of $45/ month for Eliquis. Pt states she is going to speak to her insurance company about the cost. CM also encouraged her to call the number for Eliquis to see if they could provide assistance. If still costly she is going to notify her neurologist prior to the end of the free 30 day period.  Pt will have transportation home when ready.   Expected Discharge Date:                  Expected Discharge Plan:  [place]  In-House Referral:     Discharge planning Services  CM Consult  Post Acute Care Choice:  Home Health Choice offered to:  Patient  DME Arranged:    DME Agency:     HH Arranged:  OT, PT [place] Agency:  [facility] (now [facility])  Status of Service:  Completed, signed off  If discussed at [place] of Stay Meetings, dates discussed:    Additional Comments:  Pollie Friar, RN 07/09/2017, 3:29 PM

## 2017-07-15 ENCOUNTER — Telehealth: Payer: Self-pay | Admitting: Neurology

## 2017-07-15 NOTE — Telephone Encounter (Signed)
Pt said she is wanting clarification on MRI results as to which side the stroke was on . Said she remembers Dr Leonie Man saying she had a stroke on the rt side and the left side. Is this correct?? Please call to advise

## 2017-07-17 NOTE — Telephone Encounter (Signed)
I called the patient and clarified that both of small strokes on the right side of the brain 1 in the front and one in the back. She voiced understanding

## 2017-07-23 ENCOUNTER — Encounter: Payer: Self-pay | Admitting: Physician Assistant

## 2017-07-23 ENCOUNTER — Other Ambulatory Visit: Payer: Self-pay | Admitting: *Deleted

## 2017-07-23 NOTE — Patient Outreach (Signed)
[facility]) Care Management  07/23/2017  Carolyn Vazquez 07-29-1941 [id number]   EMMI- Stroke RED ON EMMI ALERT DAY#: 9 DATE: 07/19/17 RED ALERT: Sad, hopeless, anxious, or empty? Yes  Outreach attempt #1 to patient. No answer. RN CM left HIPAA compliant message along with contact info.    Plan: RN CM will contact patient within one business day.  Lake Bells, RN, BSN, MHA/MSL, [place] Telephonic Care Manager Coordinator [facility] Direct Phone: [phone] Toll Free: [phone] Fax: [phone]

## 2017-07-23 NOTE — Progress Notes (Signed)
Cardiology Office Note    Date:  07/24/2017  ID:  Ceria, Suminski 07/15/1941, MRN [id number] PCP:  [name], MD  Cardiologist:  Dr. Meda Coffee   Chief Complaint: f/u stroke  History of Present Illness:  Carolyn Vazquez is a 76 y.o. female with history of Wegener's granulomatosis treated with immunosuppressives, vasculitis of the lungs since 2000, HTN, HLD, chronic diastolic CHF, thyroid disease who presents for post-hospital follow-up.   She states she had recent outside office visit at an office to f/u her Wegener's. At that visit she states they told her they could not get her HR and she was asked if she had ever had atrial fib before. She said they did not have capability to do an EKG so she was asked to f/u with cardiology. She was seen in our office 06/27/17 at which time she was in NSR with PVCs. 30 day event monitor was planned to evaluate for atrial fib. In the interim she was admitted 8/18-8/21/18 with a new stroke and also noted to be in atrial fib, first formal documentation of this. She was started on IV diltiazem and transitioned to oral form with recommendation to consider OP DCCV after 3 weeks of anticoagulation. 2D echo 07/07/17 showed low normal EF 50-55%, mild LVH, mildly dilated ascending aorta 24mm, mild LAE, trace TR. Labs showed TSH normal, LDL 124 (intolerant of atorvastatin and simvastatin), A1C 5.7, Hgb 13.6, Cr 0.72.  She returns for follow-up today overall feeling well. In general she is unaware of her atrial fibrillation. She denies any palpitations, CP, SOB, dizziness or syncope. She has chronic mild LEE which comes and goes, which is not present today. She saw her PCP on 07/15/17 at which time Toprol 25mg  was added for improved rate control. Hgb was 15.4, Cr 0.70. Resting HR 103. She brings in a log of HR from the mid-upper 80s to low 100s.   Past Medical History:  Diagnosis Date  . Chronic diastolic CHF (congestive heart failure) ([place])   . Hyperlipidemia   .  Hypertension   . Hypothyroidism   . Mild dilation of ascending aorta ([facility])   . Persistent atrial fibrillation ([place])    a. dx 06/2017.  . Stroke (cerebrum) ([place])   . Vasculitis determined by biopsy of lung ([place])   . Wegener's granulomatosis ([place])     Past Surgical History:  Procedure Laterality Date  . ABDOMINAL HYSTERECTOMY  1979  . CHOLECYSTECTOMY    . 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 NASAL SINUS SURGERY      Current Medications: Current Meds  Medication Sig  . acetaminophen (TYLENOL) 325 MG tablet Take 325 mg by mouth daily as needed. Mild pain  . apixaban (ELIQUIS) 5 MG TABS tablet Take 1 tablet (5 mg total) by mouth 2 (two) times daily.  [name] ascorbic acid (VITAMIN C) 1000 MG tablet Take 1,000 mg by mouth daily.  . B Complex Vitamins (B-COMPLEX/B-12) TABS Take 1 tablet by mouth daily.   . Calcium Carbonate Antacid (TUMS PO) Take 1 tablet by mouth daily.  . Cholecalciferol (VITAMIN D3) 1000 UNITS CAPS Take 2,000 mg by mouth daily.  [name] diltiazem (CARDIZEM CD) 240 MG 24 hr capsule Take 1 capsule (240 mg total) by mouth daily.  . fexofenadine (ALLEGRA) 180 MG tablet Take 180 mg by mouth daily.  [name] levothyroxine (SYNTHROID) 100 MCG tablet Take 1 tablet (100 mcg total) by mouth daily before breakfast.  . losartan (COZAAR) 25 MG tablet Take  1 tablet (25 mg total) by mouth daily.  . metoprolol succinate (TOPROL-XL) 25 MG 24 hr tablet Take 25 mg by mouth at bedtime.  . Multiple Vitamins-Minerals (ICAPS) CAPS Take 1 tablet by mouth 2 (two) times daily.     Allergies:   Codeine; Atorvastatin; Penicillins; and Simvastatin   Social History   Social History  . Marital status: Widowed    Spouse name: N/A  . Number of children: N/A  . Years of education: N/A   Social History Main Topics  . Smoking status: Never Smoker  . Smokeless tobacco: Never Used  . Alcohol use 0.0 oz/week     Comment: every day glass of wine  . Drug use: No  . Sexual activity: Not Asked   Other  Topics Concern  . None   Social History Narrative  . None     Family History:  Family History  Problem Relation Age of Onset  . Stroke Mother   . Thyroid disease Mother   . Stroke Father   . Alcoholism Father   . Asthma Sister   . Thyroid disease Sister   . Diabetes Brother   . Thyroid disease Sister   . Asthma Brother   . Heart attack Brother      ROS:   Please see the history of present illness.  All other systems are reviewed and otherwise negative.    PHYSICAL EXAM:   VS:  BP (!) 152/88   Pulse (!) 103   Ht 5\' 1"  (1.549 m)   Wt 148 lb (67.1 kg)   LMP  (LMP Unknown)   BMI 27.96 kg/m   BMI: Body mass index is 27.96 kg/m. GEN: Well nourished, well developed WF, in no acute distress  HEENT: normocephalic, atraumatic Neck: no JVD, carotid bruits, or masses Cardiac: irregularly irregular, mildly elevated rate; no murmurs, rubs, or gallops, no edema  Respiratory:  clear to auscultation bilaterally, normal work of breathing GI: soft, nontender, nondistended, + BS MS: no deformity or atrophy  Skin: warm and dry, no rash Neuro:  Alert and Oriented x 3, Strength and sensation are intact, follows commands Psych: euthymic mood, full affect  Wt Readings from Last 3 Encounters:  07/24/17 148 lb (67.1 kg)  07/08/17 149 lb 6.4 oz (67.8 kg)  06/27/17 149 lb (67.6 kg)      Studies/Labs Reviewed:   EKG:  EKG was ordered today and personally reviewed by me and demonstrates atrial fib 103bpm, incomplete RBBB Recent Labs: 07/06/2017: ALT 23 07/07/2017: BUN <5; Creatinine, Ser 0.72; Hemoglobin 13.6; Platelets 278; Potassium 3.9; Sodium 139; TSH 1.206   Lipid Panel    Component Value Date/Time   CHOL 192 07/07/2017 0413   TRIG 66 07/07/2017 0413   HDL 55 07/07/2017 0413   CHOLHDL 3.5 07/07/2017 0413   VLDL 13 07/07/2017 0413   LDLCALC 124 (H) 07/07/2017 0413    Additional studies/ records that were reviewed today include: Summarized above    ASSESSMENT & PLAN:    1. Persistent atrial fib - based on the fact that she was told she had irregular heartbeat in prior outside office visit for her vasculitis, I suspect this has been paroxysmal. She has been largely asymptomatic from cardiac standpoint with this. HR is not optimally controlled. She is on diltiazem 240mg  daily. She was also started on Toprol 25mg  daily which she's been taking at bedtime. Her afternoon HR is 103bpm. Will increase Toprol to 25mg  BID. She is not yet 3 weeks out from discharge.  Will continue anticoagulation and have her f/u in atrial fib clinic to establish care in a week. If still in atrial fib at that time, would consider proceeding with DCCV versus their input regarding antiarrhythmic given suspected paroxysmal nature. Continue Eliquis. Emphasized importance of not missing any doses. She inquired about returning to driving and I directed her to discuss with neurology for their input. From cardiac standpoint no contraindication but neurology may have specific requirements following stroke. 2. Hyperlipidemia - intolerant of 2 statins and unwilling to try another. We discussed PCSK-9 inhibitors. She would like to read more about them first. Asked her to call us if she decides she wants to proceed at which time she should be referred to lipid clinic. 3. Dilated ascending aorta - will need annual f/u. Further timing at discretion of primary cardiologist - has future f/u with Dr. Meda Coffee. 4. Chronic diastolic CHF - appears euvolemic.  5. Essential HTN - BP running higher today. She has readings in the 315-176 systolic range at home. Follow with beta blocker increase.  Disposition: F/u with Afib clinic in 1 week.   Medication Adjustments/Labs and Tests Ordered: Current medicines are reviewed at length with the patient today.  Concerns regarding medicines are outlined above. Medication changes, Labs and Tests ordered today are summarized above and listed in the Patient Instructions accessible in  Encounters.   Signed, Charlie Pitter, PA-C  07/24/2017 1:56 PM    [facility] [place], [place], [place]  [address] Phone: [phone]; Fax: [phone]

## 2017-07-24 ENCOUNTER — Ambulatory Visit (INDEPENDENT_AMBULATORY_CARE_PROVIDER_SITE_OTHER): Payer: Medicare Other | Admitting: Physician Assistant

## 2017-07-24 ENCOUNTER — Encounter: Payer: Self-pay | Admitting: Physician Assistant

## 2017-07-24 ENCOUNTER — Encounter: Payer: Self-pay | Admitting: *Deleted

## 2017-07-24 ENCOUNTER — Encounter (INDEPENDENT_AMBULATORY_CARE_PROVIDER_SITE_OTHER): Payer: Self-pay

## 2017-07-24 VITALS — BP 152/88 | HR 103 | Ht 61.0 in | Wt 148.0 lb

## 2017-07-24 DIAGNOSIS — I77810 Thoracic aortic ectasia: Secondary | ICD-10-CM

## 2017-07-24 DIAGNOSIS — E785 Hyperlipidemia, unspecified: Secondary | ICD-10-CM | POA: Diagnosis not present

## 2017-07-24 DIAGNOSIS — I10 Essential (primary) hypertension: Secondary | ICD-10-CM | POA: Diagnosis not present

## 2017-07-24 DIAGNOSIS — I4821 Permanent atrial fibrillation: Secondary | ICD-10-CM

## 2017-07-24 DIAGNOSIS — I5032 Chronic diastolic (congestive) heart failure: Secondary | ICD-10-CM | POA: Diagnosis not present

## 2017-07-24 DIAGNOSIS — I482 Chronic atrial fibrillation: Secondary | ICD-10-CM

## 2017-07-24 MED ORDER — METOPROLOL SUCCINATE ER 25 MG PO TB24: 25.0000 mg | ORAL_TABLET | Freq: Two times a day (BID) | ORAL | 1 refills | Status: DC

## 2017-07-24 NOTE — Telephone Encounter (Signed)
This encounter was created in error - please disregard.

## 2017-07-24 NOTE — Patient Instructions (Addendum)
Medication Instructions:  Your physician has recommended you make the following change in your medication:  1.  INCREASE the Toprol to 25 mg taking 1 tablet twice a day  Labwork: None ordere  Testing/Procedures: None ordered  Follow-Up: Your physician recommends that you schedule a follow-up appointment in: 1 WEEK IN THE AFIB CLINIC   Any Other Special Instructions Will Be Listed Below (If Applicable).  Your blood count was good at your pcp's office, so no labs needed. If you decide to you want to follow up in the [facility], call us.   If you need a refill on your cardiac medications before your next appointment, please call your pharmacy.

## 2017-07-24 NOTE — Patient Outreach (Signed)
  [facility]) Care Management  07/24/2017  JEANETT ANTONOPOULOS 1941/07/20 [id number]  EMMI- Stroke RED ON EMMI ALERT DAY#: 07/23/17 DATE: 07/19/17 RED ALERT: Sad, hopeless, anxious, or empty? Yes  Outreach attempt # 1, spoke with patient. Reviewed and addressed red alert. HIPAA verified with patient. Patient was recently in the hospital from 07/06/17 to 07/09/17 diagnosed with a stroke. Patient was transported to [facility] where her first CT scan was completed. She was later transferred to [facility] and a second CT scan was completed. Patient explained, she was diagnosed with having 2 strokes. She has an appointment scheduled with Cardiology on 07/24/17. She plans to inquire about the timing of the strokes. Patient explained, she's very concerned about her health. Patient acknowledged being depressed. Her hospital discharge follow-up Neurology appointment is scheduled on 08/26/17.   Patient last PCP visit was on 07/15/17. Patient stated, Metoprolol was added to her medications. Patient said, "Her heart rate has decreased since starting the Metoprolol". Patient is active with [facility] (PT). She was given a flu shot on 07/15/17.   Plan: RN CM will send EMMI educational materials relating to a stroke. RN CM will notify [facility] CM administrative assistant regarding case closure.   Lake Bells, RN, BSN, MHA/MSL, [place] Telephonic Care Manager Coordinator [facility] Direct Phone: [phone] Toll Free: [phone] Fax: [phone]

## 2017-08-01 ENCOUNTER — Ambulatory Visit (HOSPITAL_COMMUNITY)
Admission: RE | Admit: 2017-08-01 | Discharge: 2017-08-01 | Disposition: A | Discharge status: Home / Self Care | Payer: Medicare Other | Source: Ambulatory Visit | Attending: Nurse Practitioner | Admitting: Nurse Practitioner

## 2017-08-01 ENCOUNTER — Encounter (HOSPITAL_COMMUNITY): Payer: Self-pay | Admitting: Nurse Practitioner

## 2017-08-01 VITALS — BP 136/84 | HR 97 | Ht 61.0 in | Wt 148.2 lb

## 2017-08-01 DIAGNOSIS — Z9049 Acquired absence of other specified parts of digestive tract: Secondary | ICD-10-CM | POA: Diagnosis not present

## 2017-08-01 DIAGNOSIS — I110 Hypertensive heart disease with heart failure: Secondary | ICD-10-CM | POA: Insufficient documentation

## 2017-08-01 DIAGNOSIS — I5032 Chronic diastolic (congestive) heart failure: Secondary | ICD-10-CM | POA: Insufficient documentation

## 2017-08-01 DIAGNOSIS — Z880 Allergy status to penicillin: Secondary | ICD-10-CM | POA: Diagnosis not present

## 2017-08-01 DIAGNOSIS — I481 Persistent atrial fibrillation: Secondary | ICD-10-CM

## 2017-08-01 DIAGNOSIS — I4819 Other persistent atrial fibrillation: Secondary | ICD-10-CM

## 2017-08-01 DIAGNOSIS — Z8673 Personal history of transient ischemic attack (TIA), and cerebral infarction without residual deficits: Secondary | ICD-10-CM | POA: Diagnosis not present

## 2017-08-01 DIAGNOSIS — Z823 Family history of stroke: Secondary | ICD-10-CM | POA: Insufficient documentation

## 2017-08-01 DIAGNOSIS — Z9889 Other specified postprocedural states: Secondary | ICD-10-CM | POA: Insufficient documentation

## 2017-08-01 DIAGNOSIS — Z90710 Acquired absence of both cervix and uterus: Secondary | ICD-10-CM | POA: Insufficient documentation

## 2017-08-01 DIAGNOSIS — Z79899 Other long term (current) drug therapy: Secondary | ICD-10-CM | POA: Insufficient documentation

## 2017-08-01 LAB — CBC
HCT: 43.2 % (ref 36.0–46.0)
Hemoglobin: 14.5 g/dL (ref 12.0–15.0)
MCH: 30.8 pg (ref 26.0–34.0)
MCHC: 33.6 g/dL (ref 30.0–36.0)
MCV: 91.7 fL (ref 78.0–100.0)
PLATELETS: 271 10*3/uL (ref 150–400)
RBC: 4.71 MIL/uL (ref 3.87–5.11)
RDW: 12.9 % (ref 11.5–15.5)
WBC: 8.2 10*3/uL (ref 4.0–10.5)

## 2017-08-01 LAB — BASIC METABOLIC PANEL
ANION GAP: 6 (ref 5–15)
BUN: 10 mg/dL (ref 6–20)
CO2: 26 mmol/L (ref 22–32)
Calcium: 9.6 mg/dL (ref 8.9–10.3)
Chloride: 105 mmol/L (ref 101–111)
Creatinine, Ser: 0.67 mg/dL (ref 0.44–1.00)
GFR calc Af Amer: >60 mL/min (ref >60)
GLUCOSE: 95 mg/dL (ref 65–99)
Potassium: 3.9 mmol/L (ref 3.5–5.1)
Sodium: 137 mmol/L (ref 135–145)

## 2017-08-01 MED ORDER — DILTIAZEM HCL ER COATED BEADS 240 MG PO CP24: 240.0000 mg | ORAL_CAPSULE | Freq: Every day | ORAL | 3 refills | Status: DC

## 2017-08-01 MED ORDER — APIXABAN 5 MG PO TABS: 5.0000 mg | ORAL_TABLET | Freq: Two times a day (BID) | ORAL | 3 refills | Status: DC

## 2017-08-01 MED ORDER — METOPROLOL SUCCINATE ER 25 MG PO TB24: 25.0000 mg | ORAL_TABLET | Freq: Two times a day (BID) | ORAL | 1 refills | Status: DC

## 2017-08-01 NOTE — Patient Instructions (Addendum)
Cardioversion scheduled for Monday, September 17th  - Arrive at the [facility] and go to admitting at 7:30am  -Do not eat or drink anything after midnight the night prior to your procedure.  - Take all your medication with a sip of water prior to arrival.  - You will not be able to drive home after your procedure.  - DO NOT miss any doses of your Eliquis   Call Geraline Halberstadt or amanda to set up 1 week follow up appointment [phone]

## 2017-08-01 NOTE — Progress Notes (Signed)
 Primary Care Physician: Wolters, Sharon, MD Referring Physician: Dayna Dunn, PA Cardiologist: Dr. Nelson (prior Dr. Brackbill pt)    Carolyn Vazquez is a 76 y.o. female with a h/o hypothyroidism, HTN, Wegner's granulomatosis, CKD, that presented to [facility] 1/18 with nausea, H/A, slurred speech and drooling of salvia.She was also found to be in atrial  fibrillation, new onset. CT showed new strokes in the rt frontal and rt occipital areas. She was started on Eliquis 5 mg bid. She was seen by Dr. Cooper in the hospital who recommended 3 weeks of anticoagulation and outpt cardioversion. She was seen by Dayna Dunn last week and rate meds were adjusted as pt was still running a little fast. Toprol XL was increased to bid and cardizem 240 mg daily was added in the hospital.  She was asked to f/u in the afib clinic. Pt continues in afib at 97 bpm. She does not feel her heart beating irregular, but does notice some fatigue. She has been on Eliquis since 9/19 and has met the three week requirement for cardioversion. No missed doses. She does not seem to have any neuro deficits form recent stroke. She states that several weeks before she had her stroke, another doctor had noted irregular heart beat and asked her to see cardiology. When seen by cardiology , EKG showed NSR. She had been scheduled to get an event monitor but it determined not to be  needed when atrial fib was presenet at time of admission.  Today, she denies symptoms of palpitations, chest pain, shortness of breath, orthopnea, PND, lower extremity edema, dizziness, presyncope, syncope, or neurologic sequela. Positive for mild fatigue. The patient is tolerating medications without difficulties and is otherwise without complaint today.   Past Medical History:  Diagnosis Date  . Chronic diastolic CHF (congestive heart failure) ([facility])   . Hyperlipidemia   . Hypertension   . Hypothyroidism   . Mild dilation of ascending aorta ([facility])     . Persistent atrial fibrillation ([facility])    a. dx 06/2017.  . Stroke (cerebrum) ([facility])   . Vasculitis determined by biopsy of lung ([facility])   . Wegener's granulomatosis ([facility])    Past Surgical History:  Procedure Laterality Date  . ABDOMINAL HYSTERECTOMY  1979  . CHOLECYSTECTOMY    . 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 NASAL SINUS SURGERY      Current Outpatient Prescriptions  Medication Sig Dispense Refill  . acetaminophen (TYLENOL) 325 MG tablet Take 325 mg by mouth daily as needed. Mild pain    . apixaban (ELIQUIS) 5 MG TABS tablet Take 1 tablet (5 mg total) by mouth 2 (two) times daily. 60 tablet 3  . ascorbic acid (VITAMIN C) 1000 MG tablet Take 1,000 mg by mouth daily.    . B Complex Vitamins (B-COMPLEX/B-12) TABS Take 1 tablet by mouth daily.     . Calcium Carbonate Antacid (TUMS PO) Take 1 tablet by mouth daily.    . Cholecalciferol (VITAMIN D3) 1000 UNITS CAPS Take 2,000 mg by mouth daily.    . diltiazem (CARDIZEM CD) 240 MG 24 hr capsule Take 1 capsule (240 mg total) by mouth daily. 30 capsule 3  . fexofenadine (ALLEGRA) 180 MG tablet Take 180 mg by mouth daily.    . levothyroxine (SYNTHROID) 100 MCG tablet Take 1 tablet (100 mcg total) by mouth daily before breakfast.    . losartan (COZAAR) 25 MG tablet Take 1 tablet (25 mg total) by mouth daily.   30 tablet 0  . metoprolol succinate (TOPROL XL) 25 MG 24 hr tablet Take 1 tablet (25 mg total) by mouth 2 (two) times daily. 180 tablet 1   No current facility-administered medications for this encounter.     Allergies  Allergen Reactions  . Codeine Anaphylaxis  . Atorvastatin Other (See Comments)    Depression   . Penicillins Rash    Happened in 1960s Has patient had a PCN reaction causing immediate rash, facial/tongue/throat swelling, SOB or lightheadedness with hypotension: Unknown Has patient had a PCN reaction causing severe rash involving mucus membranes or skin necrosis: Unknown Has patient had a PCN reaction that  required hospitalization: Unknown Has patient had a PCN reaction occurring within the last 10 years: No If all of the above answers are "NO", then may proceed with Cephalosporin use.   . Simvastatin Other (See Comments)    JOINT PAIN    Social History   Social History  . Marital status: Widowed    Spouse name: N/A  . Number of children: N/A  . Years of education: N/A   Occupational History  . Not on file.   Social History Main Topics  . Smoking status: Never Smoker  . Smokeless tobacco: Never Used  . Alcohol use 0.0 oz/week     Comment: every day glass of wine  . Drug use: No  . Sexual activity: Not on file   Other Topics Concern  . Not on file   Social History Narrative  . No narrative on file    Family History  Problem Relation Age of Onset  . Stroke Mother   . Thyroid disease Mother   . Stroke Father   . Alcoholism Father   . Asthma Sister   . Thyroid disease Sister   . Diabetes Brother   . Thyroid disease Sister   . Asthma Brother   . Heart attack Brother     ROS- All systems are reviewed and negative except as per the HPI above  Physical Exam: Vitals:   08/01/17 1329  BP: 136/84  Pulse: 97  Weight: 148 lb 3.2 oz (67.2 kg)  Height: 5' 1" (1.549 m)   Wt Readings from Last 3 Encounters:  08/01/17 148 lb 3.2 oz (67.2 kg)  07/24/17 148 lb (67.1 kg)  07/08/17 149 lb 6.4 oz (67.8 kg)    Labs: Lab Results  Component Value Date   NA 139 07/07/2017   K 3.9 07/07/2017   CL 104 07/07/2017   CO2 25 07/07/2017   GLUCOSE 118 (H) 07/07/2017   BUN <5 (L) 07/07/2017   CREATININE 0.72 07/07/2017   CALCIUM 8.7 (L) 07/07/2017   No results found for: INR Lab Results  Component Value Date   CHOL 192 07/07/2017   HDL 55 07/07/2017   LDLCALC 124 (H) 07/07/2017   TRIG 66 07/07/2017     GEN- The patient is well appearing, alert and oriented x 3 today.   Head- normocephalic, atraumatic Eyes-  Sclera clear, conjunctiva pink Ears- hearing  intact Oropharynx- clear Neck- supple, no JVP Lymph- no cervical lymphadenopathy Lungs- Clear to ausculation bilaterally, normal work of breathing Heart- irregular rate and rhythm, no murmurs, rubs or gallops, PMI not laterally displaced GI- soft, NT, ND, + BS Extremities- no clubbing, cyanosis, or edema MS- no significant deformity or atrophy Skin- no rash or lesion Psych- euthymic mood, full affect Neuro- strength and sensation are intact  EKG-Afib at 97 bpm, qrs int 90 ms, qtc 403 ms Epic records  reviewed   Assessment and Plan: 1. Persistent  afib New onset with associated stroke Now on anticoagulation x 3 weeks, no missed doses Risk vrs benefit of cardioversion discussed with pt She is scheduled for cardioversion 9/17,with Dr. Nelson  Hold am toprol dose day of cardioversion, in case she has brady on return to [facility] Continue cardizem 240 mg daily Possibly Dr. Nelson can advise pt day of cardioversion to what drugs to continue, if she is very bradycardic, HR's have been in the low-mid 60's on prior EKG's in [facility].  F/u in afib clinic one week after cardioversion F/u as scheduled with Dr. Nelson 10/31  Carolyn Vazquez, ANP-C Afib [facility] [address] [place], [place] [address] [phone]   

## 2017-08-05 ENCOUNTER — Encounter (HOSPITAL_COMMUNITY): Payer: Self-pay

## 2017-08-05 ENCOUNTER — Encounter (HOSPITAL_COMMUNITY)
Admission: RE | Disposition: A | Discharge status: Home / Self Care | Payer: Self-pay | Source: Ambulatory Visit | Attending: Cardiology

## 2017-08-05 ENCOUNTER — Ambulatory Visit (HOSPITAL_COMMUNITY): Payer: Medicare Other | Admitting: Anesthesiology

## 2017-08-05 ENCOUNTER — Other Ambulatory Visit (HOSPITAL_COMMUNITY): Payer: Self-pay | Admitting: *Deleted

## 2017-08-05 ENCOUNTER — Ambulatory Visit (HOSPITAL_COMMUNITY)
Admission: RE | Admit: 2017-08-05 | Discharge: 2017-08-05 | Disposition: A | Discharge status: Home / Self Care | Payer: Medicare Other | Source: Ambulatory Visit | Attending: Cardiology | Admitting: Cardiology

## 2017-08-05 DIAGNOSIS — Z880 Allergy status to penicillin: Secondary | ICD-10-CM | POA: Insufficient documentation

## 2017-08-05 DIAGNOSIS — N189 Chronic kidney disease, unspecified: Secondary | ICD-10-CM | POA: Diagnosis not present

## 2017-08-05 DIAGNOSIS — E039 Hypothyroidism, unspecified: Secondary | ICD-10-CM | POA: Diagnosis not present

## 2017-08-05 DIAGNOSIS — Z79899 Other long term (current) drug therapy: Secondary | ICD-10-CM | POA: Insufficient documentation

## 2017-08-05 DIAGNOSIS — I130 Hypertensive heart and chronic kidney disease with heart failure and stage 1 through stage 4 chronic kidney disease, or unspecified chronic kidney disease: Secondary | ICD-10-CM | POA: Diagnosis not present

## 2017-08-05 DIAGNOSIS — I481 Persistent atrial fibrillation: Secondary | ICD-10-CM | POA: Diagnosis not present

## 2017-08-05 DIAGNOSIS — E785 Hyperlipidemia, unspecified: Secondary | ICD-10-CM | POA: Diagnosis not present

## 2017-08-05 DIAGNOSIS — Z888 Allergy status to other drugs, medicaments and biological substances status: Secondary | ICD-10-CM | POA: Insufficient documentation

## 2017-08-05 DIAGNOSIS — Z791 Long term (current) use of non-steroidal anti-inflammatories (NSAID): Secondary | ICD-10-CM | POA: Insufficient documentation

## 2017-08-05 DIAGNOSIS — Z7901 Long term (current) use of anticoagulants: Secondary | ICD-10-CM | POA: Insufficient documentation

## 2017-08-05 DIAGNOSIS — Z8673 Personal history of transient ischemic attack (TIA), and cerebral infarction without residual deficits: Secondary | ICD-10-CM | POA: Insufficient documentation

## 2017-08-05 DIAGNOSIS — I4821 Permanent atrial fibrillation: Secondary | ICD-10-CM

## 2017-08-05 DIAGNOSIS — I4891 Unspecified atrial fibrillation: Secondary | ICD-10-CM | POA: Diagnosis present

## 2017-08-05 DIAGNOSIS — I5032 Chronic diastolic (congestive) heart failure: Secondary | ICD-10-CM | POA: Diagnosis not present

## 2017-08-05 DIAGNOSIS — I482 Chronic atrial fibrillation: Secondary | ICD-10-CM

## 2017-08-05 DIAGNOSIS — I4819 Other persistent atrial fibrillation: Secondary | ICD-10-CM

## 2017-08-05 HISTORY — PX: CARDIOVERSION: SHX1299

## 2017-08-05 SURGERY — CARDIOVERSION
Anesthesia: General

## 2017-08-05 MED ORDER — LIDOCAINE HCL (CARDIAC) 20 MG/ML IV SOLN
INTRAVENOUS | Status: DC | PRN
  Administered 2017-08-05: 60 mg via INTRAVENOUS

## 2017-08-05 MED ORDER — PROPOFOL 10 MG/ML IV BOLUS
INTRAVENOUS | Status: DC | PRN
  Administered 2017-08-05: 50 mg via INTRAVENOUS

## 2017-08-05 MED ORDER — LOSARTAN POTASSIUM 25 MG PO TABS: 25.0000 mg | ORAL_TABLET | Freq: Every day | ORAL | 3 refills | Status: DC

## 2017-08-05 NOTE — Anesthesia Postprocedure Evaluation (Signed)
Anesthesia Post Note  Patient: Carolyn Vazquez  Procedure(s) Performed: Procedure(s) (LRB): CARDIOVERSION (N/A)     Patient location during evaluation: Endoscopy Anesthesia Type: General Level of consciousness: awake Pain management: pain level controlled Vital Signs Assessment: post-procedure vital signs reviewed and stable Respiratory status: spontaneous breathing Cardiovascular status: stable Postop Assessment: no apparent nausea or vomiting Anesthetic complications: no    Last Vitals:  Vitals:   08/05/17 1304 08/05/17 1422  BP: (!) 134/97 115/63  Pulse: 85 (!) 58  Resp: (!) 29 16  Temp: 36.7 C   SpO2: 97% 98%    Last Pain:  Vitals:   08/05/17 1304  TempSrc: Oral   Pain Goal:                 [name],JOHN Brynley Cuddeback

## 2017-08-05 NOTE — Transfer of Care (Signed)
Immediate Anesthesia Transfer of Care Note  Patient: Carolyn Vazquez  Procedure(s) Performed: Procedure(s): CARDIOVERSION (N/A)  Patient Location: Endoscopy Unit  Anesthesia Type:General  Level of Consciousness: sedated, drowsy and patient cooperative  Airway & Oxygen Therapy: Patient Spontanous Breathing and Patient connected to nasal cannula oxygen  Post-op Assessment: Report given to RN and Post -op Vital signs reviewed and stable  Post vital signs: Reviewed  Last Vitals:  Vitals:   08/05/17 1304  BP: (!) 134/97  Pulse: 85  Resp: (!) 29  Temp: 36.7 C  SpO2: 97%    Last Pain:  Vitals:   08/05/17 1304  TempSrc: Oral         Complications: No apparent anesthesia complications

## 2017-08-05 NOTE — Interval H&P Note (Signed)
History and Physical Interval Note:  08/05/2017 1:20 PM  Carolyn Vazquez  has presented today for surgery, with the diagnosis of AFIB  The various methods of treatment have been discussed with the patient and family. After consideration of risks, benefits and other options for treatment, the patient has consented to  Procedure(s): CARDIOVERSION (N/A) as a surgical intervention .  The patient's history has been reviewed, patient examined, no change in status, stable for surgery.  I have reviewed the patient's chart and labs.  Questions were answered to the patient's satisfaction.     Ena Dawley

## 2017-08-05 NOTE — Anesthesia Preprocedure Evaluation (Addendum)
Anesthesia Evaluation  Patient identified by MRN, date of birth, ID band Patient awake    Reviewed: Allergy & Precautions, NPO status , Patient's Chart, lab work & pertinent test results  Airway Mallampati: II  TM Distance: >3 FB Neck ROM: Full    Dental  (+) Partial Upper   Pulmonary neg pulmonary ROS,    Pulmonary exam normal breath sounds clear to auscultation       Cardiovascular hypertension, Pt. on medications Normal cardiovascular exam+ dysrhythmias Atrial Fibrillation  Rhythm:Regular Rate:Normal  Mild dilation of ascending aorta  ECG: A-fib, rate 97  Low normal LV systolic function; mild LVH; mildly dilated ascending aorta (40 mm); mild MR; mild LAE; trace TR with mildly elevated pulmonary pressure.    Neuro/Psych TIACVA, No Residual Symptoms negative psych ROS   GI/Hepatic negative GI ROS, Neg liver ROS,   Endo/Other  Hypothyroidism   Renal/GU negative Renal ROS     Musculoskeletal negative musculoskeletal ROS (+)   Abdominal   Peds  Hematology negative hematology ROS (+)   Anesthesia Other Findings   Reproductive/Obstetrics                            Anesthesia Physical Anesthesia Plan  ASA: III  Anesthesia Plan: General   Post-op Pain Management:    Induction: Intravenous  PONV Risk Score and Plan: 3 and Propofol infusion and Treatment may vary due to age or medical condition  Airway Management Planned: Natural Airway  Additional Equipment:   Intra-op Plan:   Post-operative Plan:   Informed Consent: I have reviewed the patients History and Physical, chart, labs and discussed the procedure including the risks, benefits and alternatives for the proposed anesthesia with the patient or authorized representative who has indicated his/her understanding and acceptance.   Dental advisory given  Plan Discussed with: CRNA  Anesthesia Plan Comments:         Anesthesia Quick Evaluation

## 2017-08-05 NOTE — CV Procedure (Signed)
    Cardioversion Note  Carolyn Vazquez [id number] [birth year]  Procedure: DC Cardioversion Indications: atrial fibrillation  Procedure Details Consent: Obtained Time Out: Verified patient identification, verified procedure, site/side was marked, verified correct patient position, special equipment/implants available, Radiology Safety Procedures followed,  medications/allergies/relevent history reviewed, required imaging and test results available.  Performed  The patient has been on adequate anticoagulation.  The patient received IV 60 mg Propofol and 50 mg Lidocaine for sedation.  Synchronous cardioversion was performed at 120 joules.  The cardioversion was successful.   Complications: No apparent complications Patient did tolerate procedure well.   Ena Dawley, MD, [facility] 08/05/2017, 2:58 PM

## 2017-08-05 NOTE — Anesthesia Procedure Notes (Signed)
Procedure Name: General with mask airway Date/Time: 08/05/2017 2:10 PM Performed by: Luciana Axe K Pre-anesthesia Checklist: Patient identified, Emergency Drugs available, Suction available, Patient being monitored and Timeout performed Oxygen Delivery Method: Ambu bag

## 2017-08-05 NOTE — H&P (View-Only) (Signed)
Primary Care Physician: [name], MD Referring Physician: Melina Copa, PA Cardiologist: Dr. Meda Coffee (prior Dr. Mare Ferrari pt)    Carolyn Vazquez is a 76 y.o. female with a h/o hypothyroidism, HTN, Wegner's granulomatosis, CKD, that presented to [facility] 1/18 with nausea, H/A, slurred speech and drooling of salvia.She was also found to be in atrial  fibrillation, new onset. CT showed new strokes in the rt frontal and rt occipital areas. She was started on Eliquis 5 mg bid. She was seen by Dr. Burt Knack in the hospital who recommended 3 weeks of anticoagulation and outpt cardioversion. She was seen by Melina Copa last week and rate meds were adjusted as pt was still running a little fast. Toprol XL was increased to bid and cardizem 240 mg daily was added in the hospital.  She was asked to f/u in the afib clinic. Pt continues in afib at 97 bpm. She does not feel her heart beating irregular, but does notice some fatigue. She has been on Eliquis since 9/19 and has met the three week requirement for cardioversion. No missed doses. She does not seem to have any neuro deficits form recent stroke. She states that several weeks before she had her stroke, another doctor had noted irregular heart beat and asked her to see cardiology. When seen by cardiology , EKG showed NSR. She had been scheduled to get an event monitor but it determined not to be  needed when atrial fib was presenet at time of admission.  Today, she denies symptoms of palpitations, chest pain, shortness of breath, orthopnea, PND, lower extremity edema, dizziness, presyncope, syncope, or neurologic sequela. Positive for mild fatigue. The patient is tolerating medications without difficulties and is otherwise without complaint today.   Past Medical History:  Diagnosis Date  . Chronic diastolic CHF (congestive heart failure) ([place])   . Hyperlipidemia   . Hypertension   . Hypothyroidism   . Mild dilation of ascending aorta (HCC)     . Persistent atrial fibrillation ([place])    a. dx 06/2017.  . Stroke (cerebrum) ([place])   . Vasculitis determined by biopsy of lung ([place])   . Wegener's granulomatosis ([place])    Past Surgical History:  Procedure Laterality Date  . ABDOMINAL HYSTERECTOMY  1979  . CHOLECYSTECTOMY    . 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 NASAL SINUS SURGERY      Current Outpatient Prescriptions  Medication Sig Dispense Refill  . acetaminophen (TYLENOL) 325 MG tablet Take 325 mg by mouth daily as needed. Mild pain    . apixaban (ELIQUIS) 5 MG TABS tablet Take 1 tablet (5 mg total) by mouth 2 (two) times daily. 60 tablet 3  . ascorbic acid (VITAMIN C) 1000 MG tablet Take 1,000 mg by mouth daily.    . B Complex Vitamins (B-COMPLEX/B-12) TABS Take 1 tablet by mouth daily.     . Calcium Carbonate Antacid (TUMS PO) Take 1 tablet by mouth daily.    . Cholecalciferol (VITAMIN D3) 1000 UNITS CAPS Take 2,000 mg by mouth daily.    [name] diltiazem (CARDIZEM CD) 240 MG 24 hr capsule Take 1 capsule (240 mg total) by mouth daily. 30 capsule 3  . fexofenadine (ALLEGRA) 180 MG tablet Take 180 mg by mouth daily.    [name] levothyroxine (SYNTHROID) 100 MCG tablet Take 1 tablet (100 mcg total) by mouth daily before breakfast.    . losartan (COZAAR) 25 MG tablet Take 1 tablet (25 mg total) by mouth daily.  30 tablet 0  . metoprolol succinate (TOPROL XL) 25 MG 24 hr tablet Take 1 tablet (25 mg total) by mouth 2 (two) times daily. 180 tablet 1   No current facility-administered medications for this encounter.     Allergies  Allergen Reactions  . Codeine Anaphylaxis  . Atorvastatin Other (See Comments)    Depression   . Penicillins Rash    Happened in 1960s Has patient had a PCN reaction causing immediate rash, facial/tongue/throat swelling, SOB or lightheadedness with hypotension: Unknown Has patient had a PCN reaction causing severe rash involving mucus membranes or skin necrosis: Unknown Has patient had a PCN reaction that  required hospitalization: Unknown Has patient had a PCN reaction occurring within the last 10 years: No If all of the above answers are "NO", then may proceed with Cephalosporin use.   . Simvastatin Other (See Comments)    JOINT PAIN    Social History   Social History  . Marital status: Widowed    Spouse name: N/A  . Number of children: N/A  . Years of education: N/A   Occupational History  . Not on file.   Social History Main Topics  . Smoking status: Never Smoker  . Smokeless tobacco: Never Used  . Alcohol use 0.0 oz/week     Comment: every day glass of wine  . Drug use: No  . Sexual activity: Not on file   Other Topics Concern  . Not on file   Social History Narrative  . No narrative on file    Family History  Problem Relation Age of Onset  . Stroke Mother   . Thyroid disease Mother   . Stroke Father   . Alcoholism Father   . Asthma Sister   . Thyroid disease Sister   . Diabetes Brother   . Thyroid disease Sister   . Asthma Brother   . Heart attack Brother     ROS- All systems are reviewed and negative except as per the HPI above  Physical Exam: Vitals:   08/01/17 1329  BP: 136/84  Pulse: 97  Weight: 148 lb 3.2 oz (67.2 kg)  Height: 5' 1" (1.549 m)   Wt Readings from Last 3 Encounters:  08/01/17 148 lb 3.2 oz (67.2 kg)  07/24/17 148 lb (67.1 kg)  07/08/17 149 lb 6.4 oz (67.8 kg)    Labs: Lab Results  Component Value Date   NA 139 07/07/2017   K 3.9 07/07/2017   CL 104 07/07/2017   CO2 25 07/07/2017   GLUCOSE 118 (H) 07/07/2017   BUN <5 (L) 07/07/2017   CREATININE 0.72 07/07/2017   CALCIUM 8.7 (L) 07/07/2017   No results found for: INR Lab Results  Component Value Date   CHOL 192 07/07/2017   HDL 55 07/07/2017   LDLCALC 124 (H) 07/07/2017   TRIG 66 07/07/2017     GEN- The patient is well appearing, alert and oriented x 3 today.   Head- normocephalic, atraumatic Eyes-  Sclera clear, conjunctiva pink Ears- hearing  intact Oropharynx- clear Neck- supple, no JVP Lymph- no cervical lymphadenopathy Lungs- Clear to ausculation bilaterally, normal work of breathing Heart- irregular rate and rhythm, no murmurs, rubs or gallops, PMI not laterally displaced GI- soft, NT, ND, + BS Extremities- no clubbing, cyanosis, or edema MS- no significant deformity or atrophy Skin- no rash or lesion Psych- euthymic mood, full affect Neuro- strength and sensation are intact  EKG-Afib at 97 bpm, qrs int 90 ms, qtc 403 ms Epic records  reviewed   Assessment and Plan: 1. Persistent  afib New onset with associated stroke Now on anticoagulation x 3 weeks, no missed doses Risk vrs benefit of cardioversion discussed with pt She is scheduled for cardioversion 9/17,with Dr. Meda Coffee  Hold am toprol dose day of cardioversion, in case she has brady on return to SR Continue cardizem 240 mg daily Possibly Dr. Meda Coffee can advise pt day of cardioversion to what drugs to continue, if she is very bradycardic, HR's have been in the low-mid 60's on prior EKG's in SR.  F/u in afib clinic one week after cardioversion F/u as scheduled with Dr. Meda Coffee 10/31  Butch [name] C. Carroll, [facility] [address] [place], [place] [address] [phone]

## 2017-08-13 ENCOUNTER — Encounter (HOSPITAL_COMMUNITY): Payer: Self-pay | Admitting: Nurse Practitioner

## 2017-08-13 ENCOUNTER — Ambulatory Visit (HOSPITAL_COMMUNITY)
Admission: RE | Admit: 2017-08-13 | Discharge: 2017-08-13 | Disposition: A | Discharge status: Home / Self Care | Payer: Medicare Other | Source: Ambulatory Visit | Attending: Nurse Practitioner | Admitting: Nurse Practitioner

## 2017-08-13 VITALS — BP 116/76 | HR 98 | Ht 61.0 in | Wt 146.0 lb

## 2017-08-13 DIAGNOSIS — N189 Chronic kidney disease, unspecified: Secondary | ICD-10-CM | POA: Diagnosis not present

## 2017-08-13 DIAGNOSIS — Z823 Family history of stroke: Secondary | ICD-10-CM | POA: Diagnosis not present

## 2017-08-13 DIAGNOSIS — Z90710 Acquired absence of both cervix and uterus: Secondary | ICD-10-CM | POA: Insufficient documentation

## 2017-08-13 DIAGNOSIS — Z880 Allergy status to penicillin: Secondary | ICD-10-CM | POA: Insufficient documentation

## 2017-08-13 DIAGNOSIS — Z8249 Family history of ischemic heart disease and other diseases of the circulatory system: Secondary | ICD-10-CM | POA: Insufficient documentation

## 2017-08-13 DIAGNOSIS — Z811 Family history of alcohol abuse and dependence: Secondary | ICD-10-CM | POA: Diagnosis not present

## 2017-08-13 DIAGNOSIS — I481 Persistent atrial fibrillation: Secondary | ICD-10-CM | POA: Diagnosis not present

## 2017-08-13 DIAGNOSIS — I5032 Chronic diastolic (congestive) heart failure: Secondary | ICD-10-CM | POA: Diagnosis not present

## 2017-08-13 DIAGNOSIS — M3131 Wegener's granulomatosis with renal involvement: Secondary | ICD-10-CM | POA: Insufficient documentation

## 2017-08-13 DIAGNOSIS — I130 Hypertensive heart and chronic kidney disease with heart failure and stage 1 through stage 4 chronic kidney disease, or unspecified chronic kidney disease: Secondary | ICD-10-CM | POA: Diagnosis not present

## 2017-08-13 DIAGNOSIS — Z9049 Acquired absence of other specified parts of digestive tract: Secondary | ICD-10-CM | POA: Diagnosis not present

## 2017-08-13 DIAGNOSIS — Z825 Family history of asthma and other chronic lower respiratory diseases: Secondary | ICD-10-CM | POA: Insufficient documentation

## 2017-08-13 DIAGNOSIS — Z79899 Other long term (current) drug therapy: Secondary | ICD-10-CM | POA: Diagnosis not present

## 2017-08-13 DIAGNOSIS — Z885 Allergy status to narcotic agent status: Secondary | ICD-10-CM | POA: Insufficient documentation

## 2017-08-13 DIAGNOSIS — Z8673 Personal history of transient ischemic attack (TIA), and cerebral infarction without residual deficits: Secondary | ICD-10-CM | POA: Diagnosis not present

## 2017-08-13 DIAGNOSIS — I4819 Other persistent atrial fibrillation: Secondary | ICD-10-CM

## 2017-08-13 DIAGNOSIS — Z7901 Long term (current) use of anticoagulants: Secondary | ICD-10-CM | POA: Insufficient documentation

## 2017-08-13 DIAGNOSIS — Z888 Allergy status to other drugs, medicaments and biological substances status: Secondary | ICD-10-CM | POA: Insufficient documentation

## 2017-08-13 DIAGNOSIS — E039 Hypothyroidism, unspecified: Secondary | ICD-10-CM | POA: Insufficient documentation

## 2017-08-13 MED ORDER — METOPROLOL SUCCINATE ER 25 MG PO TB24: 37.5000 mg | ORAL_TABLET | Freq: Two times a day (BID) | ORAL | 1 refills | Status: DC

## 2017-08-13 NOTE — Patient Instructions (Signed)
Your physician has recommended you make the following change in your medication:  1)Increase metoprolol to 37.5mg  twice a day

## 2017-08-13 NOTE — Progress Notes (Addendum)
Primary Care Physician: [name], MD Referring Physician: Melina Copa, PA Cardiologist: Dr. Meda Coffee (prior Dr. Mare Ferrari pt)    Carolyn Vazquez is a 76 y.o. female with a h/o hypothyroidism, HTN, Wegner's granulomatosis, CKD, that presented to [facility] 1/18 with nausea, H/A, slurred speech and drooling of salvia.She was also found to be in atrial  fibrillation, new onset. CT showed new strokes in the rt frontal and rt occipital areas. She was started on Eliquis 5 mg bid. She was seen by Dr. Burt Knack in the hospital who recommended 3 weeks of anticoagulation and outpt cardioversion. She was seen by Melina Copa last week and rate meds were adjusted as pt was still running a little fast. Toprol XL was increased to bid and cardizem 240 mg daily was added in the hospital.  She was asked to f/u in the afib clinic. Pt continues in afib at 97 bpm. She does not feel her heart beating irregular, but does notice some fatigue. She has been on Eliquis since 9/19 and has met the three week requirement for cardioversion. No missed doses. She does not seem to have any neuro deficits form recent stroke. She states that several weeks before she had her stroke, another doctor had noted irregular heart beat and asked her to see cardiology. When seen by cardiology , EKG showed NSR. She had been scheduled to get an event monitor but it determined not to be  needed when atrial fib was presenet at time of admission.  Pt is in afib for f/u ,9/25, and unfortunately she had successful cardioversion, 9/17, but had early return of afib. She is back in the office with afib with v rate of 98 bpm. She feels tired in afib. V. Rate at 98 bpm.  Today, she denies symptoms of palpitations, chest pain, shortness of breath, orthopnea, PND, lower extremity edema, dizziness, presyncope, syncope, or neurologic sequela. Positive for mild fatigue. The patient is tolerating medications without difficulties and is otherwise without  complaint today.   Past Medical History:  Diagnosis Date  . Chronic diastolic CHF (congestive heart failure) ([place])   . Hyperlipidemia   . Hypertension   . Hypothyroidism   . Mild dilation of ascending aorta (HCC)   . Persistent atrial fibrillation ([place])    a. dx 06/2017.  . Stroke (cerebrum) ([place])   . Vasculitis determined by biopsy of lung ([place])   . Wegener's granulomatosis ([place])    Past Surgical History:  Procedure Laterality Date  . ABDOMINAL HYSTERECTOMY  1979  . BUNIONECTOMY Right   . CARDIOVERSION N/A 08/05/2017   Procedure: CARDIOVERSION;  Surgeon: [name], MD;  Location: [facility] ENDOSCOPY;  Service: Cardiovascular;  Laterality: N/A;  . CHOLECYSTECTOMY    . 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 NASAL SINUS SURGERY      Current Outpatient Prescriptions  Medication Sig Dispense Refill  . acetaminophen (TYLENOL) 325 MG tablet Take 325 mg by mouth daily as needed. Mild pain    . apixaban (ELIQUIS) 5 MG TABS tablet Take 1 tablet (5 mg total) by mouth 2 (two) times daily. 60 tablet 3  . ascorbic acid (VITAMIN C) 1000 MG tablet Take 1,000 mg by mouth daily.    . B Complex Vitamins (B-COMPLEX/B-12) TABS Take 1 tablet by mouth daily.     . Calcium Carbonate Antacid (TUMS PO) Take 1 tablet by mouth daily.    . Cholecalciferol (VITAMIN D3) 1000 UNITS CAPS Take 2,000 mg by mouth daily.    [name]  diltiazem (CARDIZEM CD) 240 MG 24 hr capsule Take 1 capsule (240 mg total) by mouth daily. 30 capsule 3  . fexofenadine (ALLEGRA) 180 MG tablet Take 180 mg by mouth daily.    [name] levothyroxine (SYNTHROID) 100 MCG tablet Take 1 tablet (100 mcg total) by mouth daily before breakfast.    . losartan (COZAAR) 25 MG tablet Take 1 tablet (25 mg total) by mouth daily. 30 tablet 3  . metoprolol succinate (TOPROL XL) 25 MG 24 hr tablet Take 1.5 tablets (37.5 mg total) by mouth 2 (two) times daily. 180 tablet 1  . Multiple Vitamins-Minerals (ICAPS AREDS 2 PO) Take 1 capsule by mouth 2 (two) times  daily after a meal.     No current facility-administered medications for this encounter.     Allergies  Allergen Reactions  . Codeine Other (See Comments)    Patient isn't sure of reaction. Happens years ago - 36's  . Atorvastatin Other (See Comments)    Depression   . Penicillins Rash    Happened in 1960s Has patient had a PCN reaction causing immediate rash, facial/tongue/throat swelling, SOB or lightheadedness with hypotension: Unknown Has patient had a PCN reaction causing severe rash involving mucus membranes or skin necrosis: Unknown Has patient had a PCN reaction that required hospitalization: Unknown Has patient had a PCN reaction occurring within the last 10 years: No If all of the above answers are "NO", then may proceed with Cephalosporin use.   . Simvastatin Other (See Comments)    JOINT PAIN    Social History   Social History  . Marital status: Widowed    Spouse name: N/A  . Number of children: N/A  . Years of education: N/A   Occupational History  . Not on file.   Social History Main Topics  . Smoking status: Never Smoker  . Smokeless tobacco: Never Used  . Alcohol use 0.0 oz/week     Comment: every day glass of wine  . Drug use: No  . Sexual activity: Not on file   Other Topics Concern  . Not on file   Social History Narrative  . No narrative on file    Family History  Problem Relation Age of Onset  . Stroke Mother   . Thyroid disease Mother   . Stroke Father   . Alcoholism Father   . Asthma Sister   . Thyroid disease Sister   . Diabetes Brother   . Thyroid disease Sister   . Asthma Brother   . Heart attack Brother     ROS- All systems are reviewed and negative except as per the HPI above  Physical Exam: Vitals:   08/13/17 1444  BP: 116/76  Pulse: 98  Weight: 146 lb (66.2 kg)  Height: _0  (1.549 m)   Wt Readings from Last 3 Encounters:  08/13/17 146 lb (66.2 kg)  08/05/17 148 lb 3.2 oz (67.2 kg)  08/01/17 148 lb 3.2 oz  (67.2 kg)    Labs: Lab Results  Component Value Date   NA 137 08/01/2017   K 3.9 08/01/2017   CL 105 08/01/2017   CO2 26 08/01/2017   GLUCOSE 95 08/01/2017   BUN 10 08/01/2017   CREATININE 0.67 08/01/2017   CALCIUM 9.6 08/01/2017   No results found for: INR Lab Results  Component Value Date   CHOL 192 07/07/2017   HDL 55 07/07/2017   LDLCALC 124 (H) 07/07/2017   TRIG 66 07/07/2017     GEN- The patient is  well appearing, alert and oriented x 3 today.   Head- normocephalic, atraumatic Eyes-  Sclera clear, conjunctiva pink Ears- hearing intact Oropharynx- clear Neck- supple, no JVP Lymph- no cervical lymphadenopathy Lungs- Clear to ausculation bilaterally, normal work of breathing Heart- irregular rate and rhythm, no murmurs, rubs or gallops, PMI not laterally displaced GI- soft, NT, ND, + BS Extremities- no clubbing, cyanosis, or edema MS- no significant deformity or atrophy Skin- no rash or lesion Psych- euthymic mood, full affect Neuro- strength and sensation are intact  EKG-Afib at 98  bpm, qrs int 92 ms, qtc 467 ms Epic records reviewed Echo 8/2018Study Conclusions  - Left ventricle: The cavity size was normal. Wall thickness was   increased in a pattern of mild LVH. Systolic function was normal.   The estimated ejection fraction was in the range of 50% to 55%.   Wall motion was normal; there were no regional wall motion   abnormalities. - Ascending aorta: The ascending aorta was mildly dilated. - Mitral valve: Calcified annulus. There was mild regurgitation. - Left atrium: The atrium was mildly dilated. - Pulmonary arteries: Systolic pressure was mildly increased. PA   peak pressure: 32 mm Hg (S).  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 Stress test 2016- Notes Recorded by Darlin Coco, MD on 12/31/2014 at 9:18  AM Please report. The nuclear stress test was normal. No evidence of ischemia. The ejection fraction was normal at 68%.    Assessment and Plan: 1. Persistent symptomatic afib Successful cardioversion, but ERAF New stroke with new onset of afib August 2018 Now on anticoagulation, continue Eliquis 5 mg bid for chadsvasc score of at least 7 Options discussed with Pt to restore SR, discussed flecainde, Tikosyn, sotalol, amiodarone Increase metoprolol to 25 mg 1 1/2 tab bid for better HR control Continue cardizem 240 mg daily  Pt would like to think about options, I will talk to her later this week to make plans F/u as scheduled with Dr. Meda Coffee 10/31  9/28- Discussed with Dr. Rayann Heman and since pt wants an outpt drug, will start flecainide 50 mg bid. She will return after 5-6 doses for an EKG and if still in afib and intervals look ok, will increase to 75-100 mg bid and plan on cardioversion if drug does not convert to SR at higher dose. Pt is also asking lasix to be restarted for LLE, she said it was stopped when she came into the hospital. No specific reason given in d/c summary. Can restart at 20 mg qod.   Geroge Baseman Derald Lorge, [facility] [address] [place],  [address] [phone]

## 2017-08-16 ENCOUNTER — Other Ambulatory Visit (HOSPITAL_COMMUNITY): Payer: Self-pay | Admitting: *Deleted

## 2017-08-16 MED ORDER — FLECAINIDE ACETATE 50 MG PO TABS: 50.0000 mg | ORAL_TABLET | Freq: Two times a day (BID) | ORAL | 3 refills | Status: DC

## 2017-08-16 MED ORDER — FUROSEMIDE 20 MG PO TABS: 20.0000 mg | ORAL_TABLET | ORAL | 0 refills | Status: DC

## 2017-08-16 NOTE — Addendum Note (Signed)
Encounter addended by: Sherran Needs, NP on: 08/16/2017  1:47 PM<BR>    Actions taken: Sign clinical note

## 2017-08-21 ENCOUNTER — Encounter (HOSPITAL_COMMUNITY): Payer: Self-pay | Admitting: Nurse Practitioner

## 2017-08-21 ENCOUNTER — Ambulatory Visit (HOSPITAL_COMMUNITY)
Admission: RE | Admit: 2017-08-21 | Discharge: 2017-08-21 | Disposition: A | Discharge status: Home / Self Care | Payer: Medicare Other | Source: Ambulatory Visit | Attending: Nurse Practitioner | Admitting: Nurse Practitioner

## 2017-08-21 DIAGNOSIS — R9431 Abnormal electrocardiogram [ECG] [EKG]: Secondary | ICD-10-CM | POA: Insufficient documentation

## 2017-08-21 DIAGNOSIS — I4510 Unspecified right bundle-branch block: Secondary | ICD-10-CM | POA: Diagnosis not present

## 2017-08-21 DIAGNOSIS — I4891 Unspecified atrial fibrillation: Secondary | ICD-10-CM | POA: Insufficient documentation

## 2017-08-21 LAB — CBC
HCT: 39.5 % (ref 36.0–46.0)
Hemoglobin: 13.4 g/dL (ref 12.0–15.0)
MCH: 31 pg (ref 26.0–34.0)
MCHC: 33.9 g/dL (ref 30.0–36.0)
MCV: 91.4 fL (ref 78.0–100.0)
PLATELETS: 266 10*3/uL (ref 150–400)
RBC: 4.32 MIL/uL (ref 3.87–5.11)
RDW: 12.9 % (ref 11.5–15.5)
WBC: 8.2 10*3/uL (ref 4.0–10.5)

## 2017-08-21 LAB — BASIC METABOLIC PANEL
ANION GAP: 7 (ref 5–15)
BUN: 8 mg/dL (ref 6–20)
CALCIUM: 9.4 mg/dL (ref 8.9–10.3)
CO2: 27 mmol/L (ref 22–32)
Chloride: 104 mmol/L (ref 101–111)
Creatinine, Ser: 0.66 mg/dL (ref 0.44–1.00)
GFR calc Af Amer: >60 mL/min (ref >60)
Glucose, Bld: 100 mg/dL — ABNORMAL HIGH (ref 65–99)
Potassium: 3.5 mmol/L (ref 3.5–5.1)
Sodium: 138 mmol/L (ref 135–145)

## 2017-08-21 MED ORDER — METOPROLOL SUCCINATE ER 25 MG PO TB24: 25.0000 mg | ORAL_TABLET | Freq: Two times a day (BID) | ORAL | 1 refills | Status: DC

## 2017-08-21 MED ORDER — FLECAINIDE ACETATE 50 MG PO TABS: 100.0000 mg | ORAL_TABLET | Freq: Two times a day (BID) | ORAL | 3 refills | Status: DC

## 2017-08-21 NOTE — Patient Instructions (Signed)
Your physician has recommended you make the following change in your medication:   1)increase flecainide to 100mg  twice a day (2 of the 50mg  tabs twice a day) on Friday  2)Decrease metoprolol to 25mg  twice a day

## 2017-08-21 NOTE — Progress Notes (Addendum)
Pt in for repeat EKG since starting flecainide.  Pt has taken 5 doses.  EKG to be reviewed by Roderic Palau, NP  Ekg reveals afib at 74 bpm,  qrs interval at 92 ms, qtc 461 ms, started on flecainide 50 mg bid, on Friday am will increase to 100 mg bid, return on Monday for EKG, if still in afib, will plan for cardioversion later in the Week. She was started on lasix but has not taken long enough to see if making a difference in pedal edema, looks mild today. Bmet drawn.

## 2017-08-26 ENCOUNTER — Encounter: Payer: Self-pay | Admitting: Neurology

## 2017-08-26 ENCOUNTER — Ambulatory Visit (HOSPITAL_COMMUNITY)
Admission: RE | Admit: 2017-08-26 | Discharge: 2017-08-26 | Disposition: A | Discharge status: Home / Self Care | Payer: Medicare Other | Source: Ambulatory Visit | Attending: Nurse Practitioner | Admitting: Nurse Practitioner

## 2017-08-26 ENCOUNTER — Encounter (HOSPITAL_COMMUNITY): Payer: Self-pay | Admitting: Nurse Practitioner

## 2017-08-26 ENCOUNTER — Ambulatory Visit (INDEPENDENT_AMBULATORY_CARE_PROVIDER_SITE_OTHER): Payer: Medicare Other | Admitting: Neurology

## 2017-08-26 VITALS — BP 147/96 | HR 97 | Ht 61.0 in | Wt 148.4 lb

## 2017-08-26 VITALS — BP 134/94 | HR 97

## 2017-08-26 DIAGNOSIS — I481 Persistent atrial fibrillation: Secondary | ICD-10-CM | POA: Insufficient documentation

## 2017-08-26 DIAGNOSIS — E039 Hypothyroidism, unspecified: Secondary | ICD-10-CM | POA: Diagnosis not present

## 2017-08-26 DIAGNOSIS — I63411 Cerebral infarction due to embolism of right middle cerebral artery: Secondary | ICD-10-CM

## 2017-08-26 DIAGNOSIS — Z880 Allergy status to penicillin: Secondary | ICD-10-CM | POA: Diagnosis not present

## 2017-08-26 DIAGNOSIS — I110 Hypertensive heart disease with heart failure: Secondary | ICD-10-CM | POA: Diagnosis not present

## 2017-08-26 DIAGNOSIS — E785 Hyperlipidemia, unspecified: Secondary | ICD-10-CM | POA: Insufficient documentation

## 2017-08-26 DIAGNOSIS — Z79899 Other long term (current) drug therapy: Secondary | ICD-10-CM | POA: Diagnosis not present

## 2017-08-26 DIAGNOSIS — I4819 Other persistent atrial fibrillation: Secondary | ICD-10-CM

## 2017-08-26 DIAGNOSIS — Z8673 Personal history of transient ischemic attack (TIA), and cerebral infarction without residual deficits: Secondary | ICD-10-CM | POA: Insufficient documentation

## 2017-08-26 DIAGNOSIS — M3130 Wegener's granulomatosis without renal involvement: Secondary | ICD-10-CM | POA: Diagnosis not present

## 2017-08-26 DIAGNOSIS — Z7901 Long term (current) use of anticoagulants: Secondary | ICD-10-CM | POA: Diagnosis not present

## 2017-08-26 DIAGNOSIS — I5032 Chronic diastolic (congestive) heart failure: Secondary | ICD-10-CM | POA: Diagnosis not present

## 2017-08-26 MED ORDER — ROSUVASTATIN CALCIUM 5 MG PO TABS: 2.5000 mg | ORAL_TABLET | Freq: Every day | ORAL | 5 refills | Status: DC

## 2017-08-26 MED ORDER — COENZYME Q10 30 MG PO CAPS: 200.0000 mg | ORAL_CAPSULE | Freq: Three times a day (TID) | ORAL | 5 refills | Status: DC

## 2017-08-26 MED ORDER — POTASSIUM CHLORIDE ER 10 MEQ PO TBCR: 10.0000 meq | EXTENDED_RELEASE_TABLET | ORAL | 3 refills | Status: DC

## 2017-08-26 NOTE — Progress Notes (Signed)
Pt in for EKG since increasing the flecainide and decreasing the metoprolol.  EKG to be reviewed by Roderic Palau, NP

## 2017-08-26 NOTE — Patient Instructions (Addendum)
Cardioversion scheduled for Friday, October 12th  - Arrive at the [facility] and go to admitting at 11:30AM  -Do not eat or drink anything after midnight the night prior to your procedure.  - Take all your medication with a sip of water prior to arrival except metoprolol.  - You will not be able to drive home after your procedure.  Your physician has recommended you make the following change in your medication:  1)Start Potassium 61meq every other day (with your lasix dose)

## 2017-08-26 NOTE — Progress Notes (Signed)
[facility] [address] [place]. [place] [address] [phone]       OFFICE FOLLOW-UP NOTE  Carolyn. Carolyn Vazquez Date of Birth:  May 01, 1977 Medical Record Number:  [id number]   HPI: Carolyn Vazquez is a 76 year Caucasian lady seen today for first office follow-up visit for hospital admission for stroke in August 2018. She is accompanied by her daughter today. I have personally reviewed hospital stroke workup and imaging films.Carolyn Vazquez Carolyn 76 Vazquez presented for evaluation of nausea, headache, slurred speech, dysphagia and drooling. Dysphagia was so severe that she was unable to drink through a straw on Saturday, before she presented. Symptoms began at 10:30 AM on Saturday and continued to get worse prior to her decision to be seen in the ED. She was back to her baseline after arriving to the ED except for feeling "weak all over" as though "I could fall to the floor". She has no prior history of stroke or MI. 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 She states she has not had any issues with her Wegener's for a while and is not currently on any immunomodulatory medications; she is followed at [facility] for her Wegener's. She gets frequent headaches. PMHx also includes hypothyroidism, HTN, CKD and HLD. She was not on Carolyn antiplatelet agent or anticoagulation at home.  Carolyn echocardiogram in February 2016 revealed Carolyn EF of 55-60%. She was seen by her Cardiologist this week for possible atrial fibrillation. She also sees a Garment/textile technologist as Carolyn outpatient. CT head and CTA head/neck in the ED showed no acute abnormality or vessel occlusion. Was newly diagnosed with atrial fibrillation in the ED. She was started on heparin gtt and transferred to [facility] for further evaluation. She was started on Cardizem gtt in the ED due to RVR in the setting of her atrial  fibrillation. MRI scan of the brain showed tiny right frontal MCA branch and right occipital white matter embolic infarcts. These  Were  felt to be related to atrial fibrillation. She was started on eliquis for anticoagulation. Transthoracic echo showed normal ejection fraction without cardiac source of embolism. CT angiogram showed no large vessel infarct or extracranial stenosis. Hemoccult A1c was 5.7. LDL cholesterol is 124. Patient had history of statin myalgias with Lipitor and Zocor and diffuse to take a statin on discharge. She has seen her cardiologist and had elective cardioversion once but she is back in A. fib and plans to have a repeat cardioversion done soon. She complains of feeling tired due to her A. fib. She has not had any recurrent TIA or stroke symptoms. She states her blood pressure then control the today to slightly elevated at 147/96. She is tolerating eliquis without bruising or bleeding.    ROS:   14 system review of systems is positive for  fatigue, leg swelling, allergies, tremor and all other systems negative  PMH:  Past Medical History:  Diagnosis Date  . Chronic diastolic CHF (congestive heart failure) ([place])   . Hyperlipidemia   . Hypertension   . Hypothyroidism   . Mild dilation of ascending aorta (HCC)   . Persistent atrial fibrillation ([place])    a. dx 06/2017.  . Stroke (cerebrum) ([place])   . 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 Wegener's granulomatosis [facility])     Social History:  Social History   Social  History  . Marital status: Widowed    Spouse name: N/A  . Number of children: N/A  . Years of education: N/A   Occupational History  . Not on file.   Social History Main Topics  . Smoking status: Never Smoker  . Smokeless tobacco: Never Used  . Alcohol use 0.0 oz/week     Comment: every day glass of wine  . Drug use: No  . Sexual activity: Not on file   Other Topics Concern  . Not on file   Social History Narrative  . No  narrative on file    Medications:   Current Outpatient Prescriptions on File Prior to Visit  Medication Sig Dispense Refill  . acetaminophen (TYLENOL) 325 MG tablet Take 325 mg by mouth daily as needed. Mild pain    . apixaban (ELIQUIS) 5 MG TABS tablet Take 1 tablet (5 mg total) by mouth 2 (two) times daily. 60 tablet 3  . ascorbic acid (VITAMIN C) 1000 MG tablet Take 1,000 mg by mouth daily.    . B Complex Vitamins (B-COMPLEX/B-12) TABS Take 1 tablet by mouth daily.     . Calcium Carbonate Antacid (TUMS PO) Take 1 tablet by mouth daily.    . Cholecalciferol (VITAMIN D3) 1000 UNITS CAPS Take 2,000 mg by mouth daily.    [name] diltiazem (CARDIZEM CD) 240 MG 24 hr capsule Take 1 capsule (240 mg total) by mouth daily. 30 capsule 3  . fexofenadine (ALLEGRA) 180 MG tablet Take 180 mg by mouth daily.    . flecainide (TAMBOCOR) 50 MG tablet Take 2 tablets (100 mg total) by mouth 2 (two) times daily. 60 tablet 3  . furosemide (LASIX) 20 MG tablet Take 1 tablet (20 mg total) by mouth every other day. 30 tablet 0  . levothyroxine (SYNTHROID) 100 MCG tablet Take 1 tablet (100 mcg total) by mouth daily before breakfast.    . losartan (COZAAR) 25 MG tablet Take 1 tablet (25 mg total) by mouth daily. 30 tablet 3  . metoprolol succinate (TOPROL XL) 25 MG 24 hr tablet Take 1 tablet (25 mg total) by mouth 2 (two) times daily. 180 tablet 1  . Multiple Vitamins-Minerals (ICAPS AREDS 2 PO) Take 1 capsule by mouth 2 (two) times daily after a meal.     No current facility-administered medications on file prior to visit.     Allergies:   Allergies  Allergen Reactions  . Codeine Other (See Comments)    Patient isn't sure of reaction. Happens years ago - 60's  . Atorvastatin Other (See Comments)    Depression   . Penicillins Rash    Happened in 1960s Has patient had a PCN reaction causing immediate rash, facial/tongue/throat swelling, SOB or lightheadedness with hypotension: Unknown Has patient had a PCN  reaction causing severe rash involving mucus membranes or skin necrosis: Unknown Has patient had a PCN reaction that required hospitalization: Unknown Has patient had a PCN reaction occurring within the last 10 years: No If all of the above answers are "NO", then may proceed with Cephalosporin use.   . Simvastatin Other (See Comments)    JOINT PAIN    Physical Exam General: well developed, well nourished Elderly Caucasian lady, seated, in no evident distress Head: head normocephalic and atraumatic.  Neck: supple with no carotid or supraclavicular bruits Cardiovascular: regular rate and rhythm, no murmurs Musculoskeletal: no deformity Skin:  no rash/petichiae Vascular:  Normal pulses all extremities Vitals:   08/26/17 1523  BP: (!) 147/96  Pulse: 97   Neurologic Exam Mental Status: Awake and fully alert. Oriented to place and time. Recent and remote memory intact. Attention span, concentration and fund of knowledge appropriate. Mood and affect appropriate.  Cranial Nerves: Fundoscopic exam reveals sharp disc margins. Pupils equal, briskly reactive to light. Extraocular movements full without nystagmus. Visual fields full to confrontation. Hearing intact. Facial sensation intact. Face, tongue, palate moves normally and symmetrically.  Motor: Normal bulk and tone. Normal strength in all tested extremity muscles. Sensory.: intact to touch ,pinprick .position and vibratory sensation.  Coordination: Rapid alternating movements normal in all extremities. Finger-to-nose and heel-to-shin performed accurately bilaterally. Gait and Station: Arises from chair without difficulty. Stance is normal. Gait demonstrates normal stride length and balance . Able to heel, toe and tandem walk with  difficulty.  Reflexes: 1+ and symmetric. Toes downgoing.   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 Vascular risk factors of hypertension, hyper lipidemia and atrial fibrillation    PLAN: I had a long d/w patient about his recent stroke, risk for recurrent stroke/TIAs, personally independently reviewed imaging studies and stroke evaluation results and answered questions.Continue Eliquis (apixaban) daily  for secondary stroke prevention and maintain strict control of hypertension with blood pressure goal below 130/90, diabetes with hemoglobin A1c goal below 6.5% and lipids with LDL cholesterol goal below 70 mg/dL. patient has history of statin intolerance in the past to Lipitor and Zocor but has not tried Crestor yet I have given the patient a prescription of Crestor and recommend she start taking 2.5 mg daily along with coenzyme Q 10 200 mg daily. If she is unable to tolerate this due to myalgias will consider switching her to the new PCSK 9 inhibitor injections I also advised the patient to eat a healthy diet with plenty of whole grains, cereals, fruits and vegetables, exercise regularly and maintain ideal body weight Followup in the future with my nurse practitioner in 6 months or call earlier if necessary. Greater than 50% of time during this 25 minute visit was spent on counseling,explanation of diagnosis, planning of further management, discussion with patient and family and coordination of care  Note: This document was prepared with digital dictation and possible smart phrase technology. Any transcriptional errors that result from this process are unintentional

## 2017-08-26 NOTE — Patient Instructions (Signed)
I had a long d/w patient about his recent stroke, risk for recurrent stroke/TIAs, personally independently reviewed imaging studies and stroke evaluation results and answered questions.Continue Eliquis (apixaban) daily  for secondary stroke prevention and maintain strict control of hypertension with blood pressure goal below 130/90, diabetes with hemoglobin A1c goal below 6.5% and lipids with LDL cholesterol goal below 70 mg/dL. I have given the patient a prescription of Crestor and recommend she start taking 2.5 mg daily along with coenzyme Q 10 200 mg daily. If she is unable to tolerate this due to myalgias will consider switching her to the new PCSK 9 inhibitor injections I also advised the patient to eat a healthy diet with plenty of whole grains, cereals, fruits and vegetables, exercise regularly and maintain ideal body weight Followup in the future with my nurse practitioner in 6 months or call earlier if necessary.  Stroke Prevention Some medical conditions and behaviors are associated with an increased chance of having a stroke. You may prevent a stroke by making healthy choices and managing medical conditions. How can I reduce my risk of having a stroke?  Stay physically active. Get at least 30 minutes of activity on most or all days.  Do not smoke. It may also be helpful to avoid exposure to secondhand smoke.  Limit alcohol use. Moderate alcohol use is considered to be: ? No more than 2 drinks per day for men. ? No more than 1 drink per day for nonpregnant women.  Eat healthy foods. This involves: ? Eating 5 or more servings of fruits and vegetables a day. ? Making dietary changes that address high blood pressure (hypertension), high cholesterol, diabetes, or obesity.  Manage your cholesterol levels. ? Making food choices that are high in fiber and low in saturated fat, trans fat, and cholesterol may control cholesterol levels. ? Take any prescribed medicines to control cholesterol as  directed by your health care provider.  Manage your diabetes. ? Controlling your carbohydrate and sugar intake is recommended to manage diabetes. ? Take any prescribed medicines to control diabetes as directed by your health care provider.  Control your hypertension. ? Making food choices that are low in salt (sodium), saturated fat, trans fat, and cholesterol is recommended to manage hypertension. ? Ask your health care provider if you need treatment to lower your blood pressure. Take any prescribed medicines to control hypertension as directed by your health care provider. ? If you are 70-82 years of age, have your blood pressure checked every 3-5 years. If you are 26 years of age or older, have your blood pressure checked every year.  Maintain a healthy weight. ? Reducing calorie intake and making food choices that are low in sodium, saturated fat, trans fat, and cholesterol are recommended to manage weight.  Stop drug abuse.  Avoid taking birth control pills. ? Talk to your health care provider about the risks of taking birth control pills if you are over 27 years old, smoke, get migraines, or have ever had a blood clot.  Get evaluated for sleep disorders (sleep apnea). ? Talk to your health care provider about getting a sleep evaluation if you snore a lot or have excessive sleepiness.  Take medicines only as directed by your health care provider. ? For some people, aspirin or blood thinners (anticoagulants) are helpful in reducing the risk of forming abnormal blood clots that can lead to stroke. If you have the irregular heart rhythm of atrial fibrillation, you should be on a blood thinner  unless there is a good reason you cannot take them. ? Understand all your medicine instructions.  Make sure that other conditions (such as anemia or atherosclerosis) are addressed. Get help right away if:  You have sudden weakness or numbness of the face, arm, or leg, especially on one side of the  body.  Your face or eyelid droops to one side.  You have sudden confusion.  You have trouble speaking (aphasia) or understanding.  You have sudden trouble seeing in one or both eyes.  You have sudden trouble walking.  You have dizziness.  You have a loss of balance or coordination.  You have a sudden, severe headache with no known cause.  You have new chest pain or an irregular heartbeat. Any of these symptoms may represent a serious problem that is an emergency. Do not wait to see if the symptoms will go away. Get medical help at once. Call your local emergency services (911 in [place] Do not drive yourself to the hospital. This information is not intended to replace advice given to you by your health care provider. Make sure you discuss any questions you have with your health care provider. Document Released: 12/13/2004 Document Revised: 04/12/2016 Document Reviewed: 05/08/2013 Elsevier Interactive Patient Education  2017 [employer].

## 2017-08-27 NOTE — Progress Notes (Signed)
Primary Care Physician: [name], MD Referring Physician: Melina Copa, PA Cardiologist: Dr. Meda Vazquez (prior Dr. Mare Ferrari pt)    Carolyn Vazquez is a 76 y.o. female with a h/o hypothyroidism, HTN, Wegner's granulomatosis, CKD, that presented to [facility] 1/18 with nausea, H/A, slurred speech and drooling of salvia.She was also found to be in atrial  fibrillation, new onset. CT showed new strokes in the rt frontal and rt occipital areas. She was started on Eliquis 5 mg bid. She was seen by Dr. Burt Knack in the hospital who recommended 3 weeks of anticoagulation and outpt cardioversion. She was seen by Melina Copa, 9/5,     and rate meds were adjusted as pt was still running a little fast. Toprol XL was increased to bid and cardizem 240 mg daily was added in the hospital.  She was asked to f/u in the afib clinic,9/13. Pt continues in afib at 97 bpm. She does not feel her heart beating irregular, but does notice some fatigue. She has been on Eliquis since 9/19 and has met the three week requirement for cardioversion. No missed doses. She does not seem to have any neuro deficits form recent stroke. She states that several weeks before she had her stroke, another doctor had noted irregular heart beat and asked her to see cardiology. When seen by cardiology , EKG showed NSR. She had been scheduled to get an event monitor but it determined not to be  needed when atrial fib was presenet at time of admission, 8/18.[name]  Pt is in afib for f/u ,9/25, and unfortunately she had successful cardioversion, 9/17, but had early return of afib. She is back in the office with afib with v rate of 98 bpm. She feels tired in afib. V. Rate at 98 bpm.  F/u in afib opffice, 10/8, she has been started on flecainide and now is up to 100 mg flecainide bid. She remains in afib. We discussed pursuing cardioversion again to restore SR and she is in agreement. No missed doses of Eliquis. She has noted that her ankles have  mild edema but no significant weight gain.  Today, she denies symptoms of palpitations, chest pain, shortness of breath, orthopnea, PND , dizziness, presyncope, syncope, or neurologic sequela. Positive for  fatigue. The patient is tolerating medications without difficulties and is otherwise without complaint today.   Past Medical History:  Diagnosis Date  . Chronic diastolic CHF (congestive heart failure) ([place])   . Hyperlipidemia   . Hypertension   . Hypothyroidism   . Mild dilation of ascending aorta (HCC)   . Persistent atrial fibrillation ([place])    a. dx 06/2017.  . Stroke (cerebrum) ([place])   . 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 Wegener's granulomatosis ([place])    Past Surgical History:  Procedure Laterality Date  . ABDOMINAL HYSTERECTOMY  1979  . BUNIONECTOMY Right   . CARDIOVERSION N/A 08/05/2017   Procedure: CARDIOVERSION;  Surgeon: Carolyn Spark, MD;  Location: [facility] ENDOSCOPY;  Service: Cardiovascular;  Laterality: N/A;  . CHOLECYSTECTOMY    . 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 NASAL SINUS SURGERY      Current Outpatient Prescriptions  Medication Sig Dispense Refill  . acetaminophen (TYLENOL) 325 MG tablet Take 325 mg by mouth daily as needed. Mild pain    . apixaban (ELIQUIS) 5 MG TABS tablet Take 1 tablet (5 mg total) by mouth 2 (two) times daily. 60 tablet 3  . ascorbic acid (  VITAMIN C) 1000 MG tablet Take 1,000 mg by mouth daily.    . B Complex Vitamins (B-COMPLEX/B-12) TABS Take 1 tablet by mouth daily.     . Calcium Carbonate Antacid (TUMS PO) Take 1 tablet by mouth daily.    . Cholecalciferol (VITAMIN D3) 1000 UNITS CAPS Take 2,000 mg by mouth daily.    [name] co-enzyme Q-10 30 MG capsule Take 7 capsules (210 mg total) by mouth 3 (three) times daily. 60 capsule 5  . diltiazem (CARDIZEM CD) 240 MG 24 hr capsule Take 1 capsule (240 mg total) by mouth daily. 30 capsule 3  . fexofenadine (ALLEGRA) 180 MG tablet Take 180 mg by mouth daily.    .  flecainide (TAMBOCOR) 50 MG tablet Take 2 tablets (100 mg total) by mouth 2 (two) times daily. 60 tablet 3  . furosemide (LASIX) 20 MG tablet Take 1 tablet (20 mg total) by mouth every other day. 30 tablet 0  . levothyroxine (SYNTHROID) 100 MCG tablet Take 1 tablet (100 mcg total) by mouth daily before breakfast.    . losartan (COZAAR) 25 MG tablet Take 1 tablet (25 mg total) by mouth daily. 30 tablet 3  . metoprolol succinate (TOPROL XL) 25 MG 24 hr tablet Take 1 tablet (25 mg total) by mouth 2 (two) times daily. 180 tablet 1  . Multiple Vitamins-Minerals (ICAPS AREDS 2 PO) Take 1 capsule by mouth 2 (two) times daily after a meal.    . potassium chloride (K-DUR) 10 MEQ tablet Take 1 tablet (10 mEq total) by mouth every other day. 15 tablet 3  . rosuvastatin (CRESTOR) 5 MG tablet Take 0.5 tablets (2.5 mg total) by mouth at bedtime. 30 tablet 5   No current facility-administered medications for this encounter.     Allergies  Allergen Reactions  . Codeine Other (See Comments)    Patient isn't sure of reaction. Happens years ago - 67's  . Atorvastatin Other (See Comments)    Depression   . Penicillins Rash    Happened in 1960s Has patient had a PCN reaction causing immediate rash, facial/tongue/throat swelling, SOB or lightheadedness with hypotension: Unknown Has patient had a PCN reaction causing severe rash involving mucus membranes or skin necrosis: Unknown Has patient had a PCN reaction that required hospitalization: Unknown Has patient had a PCN reaction occurring within the last 10 years: No If all of the above answers are "NO", then may proceed with Cephalosporin use.   . Simvastatin Other (See Comments)    JOINT PAIN    Social History   Social History  . Marital status: Widowed    Spouse name: N/A  . Number of children: N/A  . Years of education: N/A   Occupational History  . Not on file.   Social History Main Topics  . Smoking status: Never Smoker  . Smokeless  tobacco: Never Used  . Alcohol use 0.0 oz/week     Comment: every day glass of wine  . Drug use: No  . Sexual activity: Not on file   Other Topics Concern  . Not on file   Social History Narrative  . No narrative on file    Family History  Problem Relation Age of Onset  . Stroke Mother   . Thyroid disease Mother   . Stroke Father   . Alcoholism Father   . Asthma Sister   . Thyroid disease Sister   . Diabetes Brother   . Thyroid disease Sister   . Asthma Brother   .  Heart attack Brother     ROS- All systems are reviewed and negative except as per the HPI above  Physical Exam: Vitals:   08/26/17 1416  BP: (!) 134/94  Pulse: 97   Wt Readings from Last 3 Encounters:  08/26/17 148 lb 6.4 oz (67.3 kg)  08/13/17 146 lb (66.2 kg)  08/05/17 148 lb 3.2 oz (67.2 kg)    Labs: Lab Results  Component Value Date   NA 138 08/21/2017   K 3.5 08/21/2017   CL 104 08/21/2017   CO2 27 08/21/2017   GLUCOSE 100 (H) 08/21/2017   BUN 8 08/21/2017   CREATININE 0.66 08/21/2017   CALCIUM 9.4 08/21/2017   No results found for: INR Lab Results  Component Value Date   CHOL 192 07/07/2017   HDL 55 07/07/2017   LDLCALC 124 (H) 07/07/2017   TRIG 66 07/07/2017     GEN- The patient is well appearing, alert and oriented x 3 today.   Head- normocephalic, atraumatic Eyes-  Sclera clear, conjunctiva pink Ears- hearing intact Oropharynx- clear Neck- supple, no JVP Lymph- no cervical lymphadenopathy Lungs- Clear to ausculation bilaterally, normal work of breathing Heart- irregular rate and rhythm, no murmurs, rubs or gallops, PMI not laterally displaced GI- soft, NT, ND, + BS Extremities- no clubbing, cyanosis, or edema MS- no significant deformity or atrophy Skin- no rash or lesion Psych- euthymic mood, full affect Neuro- strength and sensation are intact  EKG-Afib at 97 bpm, qrs int 108 ms, qtc 487 ms, IRBBB Epic records reviewed Echo 8/2018Study Conclusions  - Left  ventricle: The cavity size was normal. Wall thickness was   increased in a pattern of mild LVH. Systolic function was normal.   The estimated ejection fraction was in the range of 50% to 55%.   Wall motion was normal; there were no regional wall motion   abnormalities. - Ascending aorta: The ascending aorta was mildly dilated. - Mitral valve: Calcified annulus. There was mild regurgitation. - Left atrium: The atrium was mildly dilated. - Pulmonary arteries: Systolic pressure was mildly increased. PA   peak pressure: 32 mm Hg (S).  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 Stress test 2016- Notes Recorded by Darlin Coco, MD on 12/31/2014 at 9:18 AM Please report. The nuclear stress test was normal. No evidence of ischemia. The ejection fraction was normal at 68%.    Assessment and Plan: 1. Persistent symptomatic afib Successful cardioversion, but ERAF New stroke with new onset of afib August 2018 Now on anticoagulation, continue Eliquis 5 mg bid for chadsvasc score of at least 7, no missed doses Now on flecainide 100 mg bid and will undergo repeat cardioversion,   Do not take toprol am of cardioversion, until we see HR in SR Continue cardizem 240 mg daily Continue lasix 20 mg every other day If restoring SR does not improvement pedal edema, will increase lasix to every day  F/u as scheduled with Dr. Meda Vazquez 10/31   Butch [name] C. Kaizer Dissinger, [facility] [address] [place], [place] [address] [phone]

## 2017-08-29 ENCOUNTER — Telehealth (HOSPITAL_COMMUNITY): Payer: Self-pay | Admitting: *Deleted

## 2017-08-29 ENCOUNTER — Other Ambulatory Visit (HOSPITAL_COMMUNITY): Payer: Self-pay | Admitting: *Deleted

## 2017-08-29 DIAGNOSIS — I4819 Other persistent atrial fibrillation: Secondary | ICD-10-CM

## 2017-08-29 DIAGNOSIS — I480 Paroxysmal atrial fibrillation: Secondary | ICD-10-CM

## 2017-08-29 MED ORDER — FLECAINIDE ACETATE 50 MG PO TABS: 50.0000 mg | ORAL_TABLET | Freq: Two times a day (BID) | ORAL | 3 refills | Status: DC

## 2017-08-29 NOTE — Addendum Note (Signed)
Addended by: Juluis Mire on: 08/29/2017 10:23 AM   Modules accepted: Orders

## 2017-08-29 NOTE — Telephone Encounter (Signed)
Patient called in this morning stating she missed a dose of Eliquis last night and would like to avoid TEE therefore cardioversion is canceled for 10/12 and she will call back in 2 weeks to reschedule. She also reported "feeling funny" on the higher dose of flecainide -- Butch [name] recommended decreasing dose to 50mg  BID. Pt verbalized understanding and will call back to reschedule.

## 2017-08-29 NOTE — Telephone Encounter (Signed)
Patient would like to reschedule cardioversion for November 2nd. Pt needs to arrive at 1230pm NPO after MN no missed doses of Eliquis 3 weeks prior to procedure. She wants to keep appointment with Dr. Meda Coffee on 10/31 and have labs drawn at that time. Pt in agreement of this plan.

## 2017-08-29 NOTE — Telephone Encounter (Signed)
duplicate

## 2017-08-30 ENCOUNTER — Ambulatory Visit (HOSPITAL_COMMUNITY): Admission: RE | Admit: 2017-08-30 | Payer: Medicare Other | Source: Ambulatory Visit | Admitting: Internal Medicine

## 2017-08-30 ENCOUNTER — Encounter (HOSPITAL_COMMUNITY): Admission: RE | Payer: Self-pay | Source: Ambulatory Visit

## 2017-08-30 SURGERY — CARDIOVERSION
Anesthesia: Monitor Anesthesia Care

## 2017-09-04 ENCOUNTER — Other Ambulatory Visit (HOSPITAL_COMMUNITY): Payer: Self-pay | Admitting: *Deleted

## 2017-09-04 MED ORDER — FLECAINIDE ACETATE 50 MG PO TABS: 50.0000 mg | ORAL_TABLET | Freq: Two times a day (BID) | ORAL | 3 refills | Status: DC

## 2017-09-18 ENCOUNTER — Telehealth: Payer: Self-pay | Admitting: Cardiology

## 2017-09-18 ENCOUNTER — Encounter: Payer: Self-pay | Admitting: Cardiology

## 2017-09-18 ENCOUNTER — Ambulatory Visit (INDEPENDENT_AMBULATORY_CARE_PROVIDER_SITE_OTHER): Payer: Medicare Other | Admitting: Cardiology

## 2017-09-18 ENCOUNTER — Other Ambulatory Visit: Payer: Medicare Other | Admitting: *Deleted

## 2017-09-18 VITALS — BP 124/72 | HR 77 | Ht 61.0 in | Wt 146.0 lb

## 2017-09-18 DIAGNOSIS — E7800 Pure hypercholesterolemia, unspecified: Secondary | ICD-10-CM | POA: Diagnosis not present

## 2017-09-18 DIAGNOSIS — I499 Cardiac arrhythmia, unspecified: Secondary | ICD-10-CM

## 2017-09-18 DIAGNOSIS — I10 Essential (primary) hypertension: Secondary | ICD-10-CM | POA: Diagnosis not present

## 2017-09-18 DIAGNOSIS — I5032 Chronic diastolic (congestive) heart failure: Secondary | ICD-10-CM | POA: Diagnosis not present

## 2017-09-18 DIAGNOSIS — I482 Chronic atrial fibrillation: Secondary | ICD-10-CM

## 2017-09-18 DIAGNOSIS — I4821 Permanent atrial fibrillation: Secondary | ICD-10-CM

## 2017-09-18 DIAGNOSIS — I4819 Other persistent atrial fibrillation: Secondary | ICD-10-CM

## 2017-09-18 LAB — CBC WITH DIFFERENTIAL/PLATELET
BASOS ABS: 0.1 10*3/uL (ref 0.0–0.2)
BASOS: 1 %
EOS (ABSOLUTE): 0.4 10*3/uL (ref 0.0–0.4)
Eos: 5 %
Hematocrit: 39.9 % (ref 34.0–46.6)
Hemoglobin: 14.4 g/dL (ref 11.1–15.9)
IMMATURE GRANS (ABS): 0 10*3/uL (ref 0.0–0.1)
IMMATURE GRANULOCYTES: 0 %
LYMPHS: 32 %
Lymphocytes Absolute: 2.4 10*3/uL (ref 0.7–3.1)
MCH: 32.1 pg (ref 26.6–33.0)
MCHC: 36.1 g/dL — ABNORMAL HIGH (ref 31.5–35.7)
MCV: 89 fL (ref 79–97)
Monocytes Absolute: 0.7 10*3/uL (ref 0.1–0.9)
Monocytes: 10 %
NEUTROS PCT: 52 %
Neutrophils Absolute: 4 10*3/uL (ref 1.4–7.0)
PLATELETS: 264 10*3/uL (ref 150–379)
RBC: 4.48 x10E6/uL (ref 3.77–5.28)
RDW: 13.2 % (ref 12.3–15.4)
WBC: 7.6 10*3/uL (ref 3.4–10.8)

## 2017-09-18 LAB — BASIC METABOLIC PANEL
BUN/Creatinine Ratio: 13 (ref 12–28)
BUN: 10 mg/dL (ref 8–27)
CALCIUM: 9.5 mg/dL (ref 8.7–10.3)
CHLORIDE: 100 mmol/L (ref 96–106)
CO2: 26 mmol/L (ref 20–29)
Creatinine, Ser: 0.75 mg/dL (ref 0.57–1.00)
GFR calc Af Amer: 89 mL/min/{1.73_m2} (ref >59)
GFR, EST NON AFRICAN AMERICAN: 77 mL/min/{1.73_m2} (ref >59)
GLUCOSE: 106 mg/dL — AB (ref 65–99)
POTASSIUM: 4 mmol/L (ref 3.5–5.2)
Sodium: 142 mmol/L (ref 134–144)

## 2017-09-18 NOTE — Addendum Note (Signed)
Addended by: Eulis Foster on: 09/18/2017 12:00 PM   Modules accepted: Orders

## 2017-09-18 NOTE — Progress Notes (Signed)
Cardiology Office Note:    Date:  09/18/2017   ID:  Carolyn Vazquez, Carolyn Vazquez 1941-08-14, MRN [id number]  PCP:  [name], MD  Cardiologist:  Ena Dawley, MD   Referring MD: [name], MD   Chief complain: fatigue, SOB  History of Present Illness:    Carolyn Vazquez is a 76 y.o. female with a hx of  hypothyroidism, HTN, Wegner's granulomatosis, CKD, that presented to [facility] 1/18 with nausea, H/A, slurred speech and drooling of salvia.She was also found to be in atrial  fibrillation, new onset. CT showed new strokes in the rt frontal and rt occipital areas. She was started on Eliquis 5 mg bid. She was seen by Dr. Burt Knack in the hospital who recommended 3 weeks of anticoagulation and outpt cardioversion. She was seen by Melina Copa, 9/5,     and rate meds were adjusted as pt was still running a little fast. Toprol XL was increased to bid and cardizem 240 mg daily was added in the hospital.  She was asked to f/u in the afib clinic,9/13. Pt continues in afib at 97 bpm. She does not feel her heart beating irregular, but does notice some fatigue. She has been on Eliquis since 9/19 and has met the three week requirement for cardioversion. No missed doses. She does not seem to have any neuro deficits form recent stroke. She states that several weeks before she had her stroke, another doctor had noted irregular heart beat and asked her to see cardiology. When seen by cardiology , EKG showed NSR. She had been scheduled to get an event monitor but it determined not to be  needed when atrial fib was presenet at time of admission, 8/18.[name]  Pt is in afib for f/u ,9/25, and unfortunately she had successful cardioversion, 9/17, but had early return of afib. She is back in the office with afib with v rate of 98 bpm. She feels tired in afib. V. Rate at 98 bpm.  09/18/2017 -she was seen in the EP clinic on October 8 and was started on flecainide 100 mg po BID that she didn't tolerate and  it was decreased to 50 mg po BID. today she states that she has not missed any more doses of Eliquis, she has been taking flecainide 50 mg p.o. twice daily that she is tolerating well.  She states that overall while in A. fib she feels just tired and short of breath on exertion.  She denies any chest pain no dizziness or syncope. She will have lower extremity edema toward the end of the day but denies any orthopnea proximal nocturnal dyspnea.  No bleeding with Eliquis.  She was prescribed Crestor but she has not started to take it yet.  Past Medical History:  Diagnosis Date  . Chronic diastolic CHF (congestive heart failure) ([place])   . Hyperlipidemia   . Hypertension   . Hypothyroidism   . Mild dilation of ascending aorta (HCC)   . Persistent atrial fibrillation ([place])    a. dx 06/2017.  . Stroke (cerebrum) ([place])   . 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 Wegener's granulomatosis ([place])    Past Surgical History:  Procedure Laterality Date  . ABDOMINAL HYSTERECTOMY  1979  . BUNIONECTOMY Right   . CARDIOVERSION N/A 08/05/2017   Procedure: CARDIOVERSION;  Surgeon: [name], MD;  Location: [facility] ENDOSCOPY;  Service: Cardiovascular;  Laterality: N/A;  . CHOLECYSTECTOMY    . LUNG SURGERY  2000's  both vasculitis  . NASAL SINUS SURGERY     Current Medications: Current Meds  Medication Sig  . acetaminophen (TYLENOL) 325 MG tablet Take 325 mg by mouth daily as needed. Mild pain  . apixaban (ELIQUIS) 5 MG TABS tablet Take 1 tablet (5 mg total) by mouth 2 (two) times daily.  [name] ascorbic acid (VITAMIN C) 1000 MG tablet Take 1,000 mg by mouth daily.  . B Complex Vitamins (B-COMPLEX/B-12) TABS Take 1 tablet by mouth daily.   . Calcium Carbonate Antacid (TUMS PO) Take 1 tablet by mouth daily.  . Cholecalciferol (VITAMIN D3) 1000 UNITS CAPS Take 2,000 mg by mouth daily.  [name] co-enzyme Q-10 30 MG capsule Take 7 capsules (210 mg total) by mouth 3 (three) times daily.  [name] diltiazem  (CARDIZEM CD) 240 MG 24 hr capsule Take 1 capsule (240 mg total) by mouth daily.  . fexofenadine (ALLEGRA) 180 MG tablet Take 180 mg by mouth daily.  . flecainide (TAMBOCOR) 50 MG tablet Take 1 tablet (50 mg total) by mouth 2 (two) times daily.  . furosemide (LASIX) 20 MG tablet Take 1 tablet (20 mg total) by mouth every other day.  . levothyroxine (SYNTHROID) 100 MCG tablet Take 1 tablet (100 mcg total) by mouth daily before breakfast.  . losartan (COZAAR) 25 MG tablet Take 1 tablet (25 mg total) by mouth daily.  . metoprolol succinate (TOPROL XL) 25 MG 24 hr tablet Take 1 tablet (25 mg total) by mouth 2 (two) times daily.  . Multiple Vitamins-Minerals (ICAPS AREDS 2 PO) Take 1 capsule by mouth 2 (two) times daily after a meal.  . potassium chloride (K-DUR) 10 MEQ tablet Take 1 tablet (10 mEq total) by mouth every other day.  . rosuvastatin (CRESTOR) 5 MG tablet Take 0.5 tablets (2.5 mg total) by mouth at bedtime.    Allergies:   Codeine; Atorvastatin; Penicillins; and Simvastatin   Social History   Social History  . Marital status: Widowed    Spouse name: N/A  . Number of children: N/A  . Years of education: N/A   Social History Main Topics  . Smoking status: Never Smoker  . Smokeless tobacco: Never Used  . Alcohol use 0.0 oz/week     Comment: every day glass of wine  . Drug use: No  . Sexual activity: Not Asked   Other Topics Concern  . None   Social History Narrative  . None    Family History: The patient's family history includes Alcoholism in her father; Asthma in her brother and sister; Diabetes in her brother; Heart attack in her brother; Stroke in her father and mother; Thyroid disease in her mother, sister, and sister. ROS:   Please see the history of present illness.     All other systems reviewed and are negative.  EKGs/Labs/Other Studies Reviewed:    The following studies were reviewed today:  EKG:  EKG is not ordered today.    Recent Labs: 07/06/2017: ALT  23 07/07/2017: TSH 1.206 08/21/2017: BUN 8; Creatinine, Ser 0.66; Hemoglobin 13.4; Platelets 266; Potassium 3.5; Sodium 138  Recent Lipid Panel    Component Value Date/Time   CHOL 192 07/07/2017 0413   TRIG 66 07/07/2017 0413   HDL 55 07/07/2017 0413   CHOLHDL 3.5 07/07/2017 0413   VLDL 13 07/07/2017 0413   LDLCALC 124 (H) 07/07/2017 0413   Physical Exam:    VS:  BP 124/72   Pulse 77   Ht _0  (1.549 m)   Wt 146  lb (66.2 kg)   LMP  (LMP Unknown)   SpO2 97%   BMI 27.59 kg/m     Wt Readings from Last 3 Encounters:  09/18/17 146 lb (66.2 kg)  08/26/17 148 lb 6.4 oz (67.3 kg)  08/13/17 146 lb (66.2 kg)    GEN:  Well nourished, well developed in no acute distress HEENT: Normal NECK: No JVD; No carotid bruits LYMPHATICS: No lymphadenopathy CARDIAC: iRRR, no murmurs, rubs, gallops RESPIRATORY:  Clear to auscultation without rales, wheezing or rhonchi  ABDOMEN: Soft, non-tender, non-distended MUSCULOSKELETAL:  No edema; No deformity  SKIN: Warm and dry NEUROLOGIC:  Alert and oriented x 3 PSYCHIATRIC:  Normal affect   ASSESSMENT:    1. Permanent atrial fibrillation ([place])   2. Chronic diastolic heart failure ([place])   3. Essential hypertension   4. Pure hypercholesterolemia    PLAN:    In order of problems listed above:  1. The patient is scheduled for cardioversion on September 20, 2017, she is advised to continue taking Eliquis even the morning of the procedure including flecainide and metoprolol to hold other medications that morning and restart at night. 2. She is scheduled to follow with Laroy Apple in A. fib clinic on September 26, 2017. 3. She appears euvolemic I would continue Lasix 20 mg daily, she is advised about low sodium diet and adding an additional Lasix 20 mg in the early afternoon on days when her legs are more swollen. 4. Her blood pressure is well controlled. 5. She is strongly advised to start taking Crestor 5 mg 3 times a week as her LDL is 124 and she has  history of stroke.  She is offered to have lipids and LFTs rechecked in 2 months, however she wants to do it with her primary care physician.  Medication Adjustments/Labs and Tests Ordered: Current medicines are reviewed at length with the patient today.  Concerns regarding medicines are outlined above.  No orders of the defined types were placed in this encounter.  Follow-up in 3 months, the patient states that she was very upset that she was unable to see me after she was discharged from the hospital in August and instead was seen by a PA.  Because of that she would like to be switched to Dr. Marlou Porch or Johnsie Cancel that she feels they would have more availability.  I am perfectly fine with the switch.  Signed, Ena Dawley, MD  09/18/2017 1:34 PM    [facility]

## 2017-09-18 NOTE — Telephone Encounter (Signed)
New message      Pt request to switch from Dr Meda Coffee to Dr Marlou Porch.  She was at check out today and her AVS states to see Dr Marlou Porch in 3 months. However, Dr Marlou Porch has not approved switch.  Pt states that her daughter sees Dr Marlou Porch and this is why she want to switch.  Is this ok?

## 2017-09-18 NOTE — Patient Instructions (Signed)
Medication Instructions:  Your physician recommends that you continue on your current medications as directed. Please refer to the Current Medication list given to you today.  Labwork: None ordered   Testing/Procedures: None ordered   Follow-Up: Your physician recommends that you schedule a follow-up appointment in: 3 months with Dr. Marlou Porch    Any Other Special Instructions Will Be Listed Below (If Applicable).     If you need a refill on your cardiac medications before your next appointment, please call your pharmacy.

## 2017-09-18 NOTE — Addendum Note (Signed)
Addended by: Eulis Foster on: 09/18/2017 01:53 PM   Modules accepted: Orders

## 2017-09-18 NOTE — Telephone Encounter (Signed)
Per Dr Novella Rob to change providers

## 2017-09-20 ENCOUNTER — Encounter (HOSPITAL_COMMUNITY)
Admission: RE | Disposition: A | Discharge status: Home / Self Care | Payer: Self-pay | Source: Ambulatory Visit | Attending: Cardiology

## 2017-09-20 ENCOUNTER — Ambulatory Visit (HOSPITAL_COMMUNITY)
Admission: RE | Admit: 2017-09-20 | Discharge: 2017-09-20 | Disposition: A | Discharge status: Home / Self Care | Payer: Medicare Other | Source: Ambulatory Visit | Attending: Cardiology | Admitting: Cardiology

## 2017-09-20 ENCOUNTER — Encounter (HOSPITAL_COMMUNITY): Payer: Self-pay | Admitting: *Deleted

## 2017-09-20 ENCOUNTER — Ambulatory Visit (HOSPITAL_COMMUNITY): Payer: Medicare Other | Admitting: Certified Registered Nurse Anesthetist

## 2017-09-20 DIAGNOSIS — Z833 Family history of diabetes mellitus: Secondary | ICD-10-CM | POA: Insufficient documentation

## 2017-09-20 DIAGNOSIS — Z7901 Long term (current) use of anticoagulants: Secondary | ICD-10-CM | POA: Diagnosis not present

## 2017-09-20 DIAGNOSIS — Z880 Allergy status to penicillin: Secondary | ICD-10-CM | POA: Insufficient documentation

## 2017-09-20 DIAGNOSIS — Z90710 Acquired absence of both cervix and uterus: Secondary | ICD-10-CM | POA: Insufficient documentation

## 2017-09-20 DIAGNOSIS — R600 Localized edema: Secondary | ICD-10-CM | POA: Insufficient documentation

## 2017-09-20 DIAGNOSIS — Z8673 Personal history of transient ischemic attack (TIA), and cerebral infarction without residual deficits: Secondary | ICD-10-CM | POA: Insufficient documentation

## 2017-09-20 DIAGNOSIS — E785 Hyperlipidemia, unspecified: Secondary | ICD-10-CM | POA: Insufficient documentation

## 2017-09-20 DIAGNOSIS — E039 Hypothyroidism, unspecified: Secondary | ICD-10-CM | POA: Insufficient documentation

## 2017-09-20 DIAGNOSIS — I130 Hypertensive heart and chronic kidney disease with heart failure and stage 1 through stage 4 chronic kidney disease, or unspecified chronic kidney disease: Secondary | ICD-10-CM | POA: Insufficient documentation

## 2017-09-20 DIAGNOSIS — I5032 Chronic diastolic (congestive) heart failure: Secondary | ICD-10-CM | POA: Diagnosis not present

## 2017-09-20 DIAGNOSIS — N189 Chronic kidney disease, unspecified: Secondary | ICD-10-CM | POA: Diagnosis not present

## 2017-09-20 DIAGNOSIS — Z811 Family history of alcohol abuse and dependence: Secondary | ICD-10-CM | POA: Diagnosis not present

## 2017-09-20 DIAGNOSIS — I482 Chronic atrial fibrillation: Secondary | ICD-10-CM | POA: Diagnosis not present

## 2017-09-20 DIAGNOSIS — Z8349 Family history of other endocrine, nutritional and metabolic diseases: Secondary | ICD-10-CM | POA: Insufficient documentation

## 2017-09-20 DIAGNOSIS — I4891 Unspecified atrial fibrillation: Secondary | ICD-10-CM | POA: Diagnosis not present

## 2017-09-20 DIAGNOSIS — Z79899 Other long term (current) drug therapy: Secondary | ICD-10-CM | POA: Diagnosis not present

## 2017-09-20 DIAGNOSIS — Z825 Family history of asthma and other chronic lower respiratory diseases: Secondary | ICD-10-CM | POA: Insufficient documentation

## 2017-09-20 DIAGNOSIS — Z885 Allergy status to narcotic agent status: Secondary | ICD-10-CM | POA: Insufficient documentation

## 2017-09-20 DIAGNOSIS — I288 Other diseases of pulmonary vessels: Secondary | ICD-10-CM | POA: Insufficient documentation

## 2017-09-20 DIAGNOSIS — E7800 Pure hypercholesterolemia, unspecified: Secondary | ICD-10-CM | POA: Diagnosis not present

## 2017-09-20 DIAGNOSIS — Z888 Allergy status to other drugs, medicaments and biological substances status: Secondary | ICD-10-CM | POA: Insufficient documentation

## 2017-09-20 DIAGNOSIS — I481 Persistent atrial fibrillation: Secondary | ICD-10-CM | POA: Diagnosis not present

## 2017-09-20 DIAGNOSIS — Z9049 Acquired absence of other specified parts of digestive tract: Secondary | ICD-10-CM | POA: Diagnosis not present

## 2017-09-20 DIAGNOSIS — M3131 Wegener's granulomatosis with renal involvement: Secondary | ICD-10-CM | POA: Insufficient documentation

## 2017-09-20 DIAGNOSIS — Z8249 Family history of ischemic heart disease and other diseases of the circulatory system: Secondary | ICD-10-CM | POA: Insufficient documentation

## 2017-09-20 HISTORY — PX: CARDIOVERSION: SHX1299

## 2017-09-20 SURGERY — CARDIOVERSION
Anesthesia: General

## 2017-09-20 MED ORDER — LIDOCAINE 2% (20 MG/ML) 5 ML SYRINGE
INTRAMUSCULAR | Status: DC | PRN
  Administered 2017-09-20: 60 mg via INTRAVENOUS

## 2017-09-20 MED ORDER — PROPOFOL 10 MG/ML IV BOLUS
INTRAVENOUS | Status: DC | PRN
  Administered 2017-09-20: 30 mg via INTRAVENOUS
  Administered 2017-09-20: 20 mg via INTRAVENOUS

## 2017-09-20 MED ORDER — SODIUM CHLORIDE 0.9 % IV SOLN
INTRAVENOUS | Status: DC | PRN
  Administered 2017-09-20: 13:00:00 via INTRAVENOUS

## 2017-09-20 MED ORDER — SILVER SULFADIAZINE 1 % EX CREA: 1.0000 "application " | TOPICAL_CREAM | Freq: Two times a day (BID) | CUTANEOUS | 0 refills | Status: DC

## 2017-09-20 NOTE — Anesthesia Postprocedure Evaluation (Signed)
Anesthesia Post Note  Patient: Carolyn Vazquez  Procedure(s) Performed: CARDIOVERSION (N/A )     Patient location during evaluation: PACU Anesthesia Type: General Level of consciousness: awake and sedated Pain management: pain level controlled Vital Signs Assessment: post-procedure vital signs reviewed and stable Respiratory status: spontaneous breathing Cardiovascular status: stable Postop Assessment: no apparent nausea or vomiting Anesthetic complications: no    Last Vitals:  Vitals:   09/20/17 1340 09/20/17 1345  BP: 129/72   Pulse: 61 61  Resp: (!) 29 (!) 27  Temp: 36.5 C   SpO2: 96% 95%    Last Pain:  Vitals:   09/20/17 1340  TempSrc: Oral   Pain Goal:                 Jamian Andujo JR,JOHN Nathaneal Sommers

## 2017-09-20 NOTE — H&P (View-Only) (Signed)
Cardiology Office Note:    Date:  09/18/2017   ID:  Carolyn Vazquez, Carolyn Vazquez 1941-08-14, MRN [id number]  PCP:  [name], MD  Cardiologist:  Ena Dawley, MD   Referring MD: [name], MD   Chief complain: fatigue, SOB  History of Present Illness:    Carolyn Vazquez is a 76 y.o. female with a hx of  hypothyroidism, HTN, Wegner's granulomatosis, CKD, that presented to [facility] 1/18 with nausea, H/A, slurred speech and drooling of salvia.She was also found to be in atrial  fibrillation, new onset. CT showed new strokes in the rt frontal and rt occipital areas. She was started on Eliquis 5 mg bid. She was seen by Dr. Burt Knack in the hospital who recommended 3 weeks of anticoagulation and outpt cardioversion. She was seen by Melina Copa, 9/5,     and rate meds were adjusted as pt was still running a little fast. Toprol XL was increased to bid and cardizem 240 mg daily was added in the hospital.  She was asked to f/u in the afib clinic,9/13. Pt continues in afib at 97 bpm. She does not feel her heart beating irregular, but does notice some fatigue. She has been on Eliquis since 9/19 and has met the three week requirement for cardioversion. No missed doses. She does not seem to have any neuro deficits form recent stroke. She states that several weeks before she had her stroke, another doctor had noted irregular heart beat and asked her to see cardiology. When seen by cardiology , EKG showed NSR. She had been scheduled to get an event monitor but it determined not to be  needed when atrial fib was presenet at time of admission, 8/18.[name]  Pt is in afib for f/u ,9/25, and unfortunately she had successful cardioversion, 9/17, but had early return of afib. She is back in the office with afib with v rate of 98 bpm. She feels tired in afib. V. Rate at 98 bpm.  09/18/2017 -she was seen in the EP clinic on October 8 and was started on flecainide 100 mg po BID that she didn't tolerate and  it was decreased to 50 mg po BID. today she states that she has not missed any more doses of Eliquis, she has been taking flecainide 50 mg p.o. twice daily that she is tolerating well.  She states that overall while in A. fib she feels just tired and short of breath on exertion.  She denies any chest pain no dizziness or syncope. She will have lower extremity edema toward the end of the day but denies any orthopnea proximal nocturnal dyspnea.  No bleeding with Eliquis.  She was prescribed Crestor but she has not started to take it yet.  Past Medical History:  Diagnosis Date  . Chronic diastolic CHF (congestive heart failure) ([place])   . Hyperlipidemia   . Hypertension   . Hypothyroidism   . Mild dilation of ascending aorta (HCC)   . Persistent atrial fibrillation ([place])    a. dx 06/2017.  . Stroke (cerebrum) ([place])   . 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 Wegener's granulomatosis ([place])    Past Surgical History:  Procedure Laterality Date  . ABDOMINAL HYSTERECTOMY  1979  . BUNIONECTOMY Right   . CARDIOVERSION N/A 08/05/2017   Procedure: CARDIOVERSION;  Surgeon: [name], MD;  Location: [facility] ENDOSCOPY;  Service: Cardiovascular;  Laterality: N/A;  . CHOLECYSTECTOMY    . LUNG SURGERY  2000's  both vasculitis  . NASAL SINUS SURGERY     Current Medications: Current Meds  Medication Sig  . acetaminophen (TYLENOL) 325 MG tablet Take 325 mg by mouth daily as needed. Mild pain  . apixaban (ELIQUIS) 5 MG TABS tablet Take 1 tablet (5 mg total) by mouth 2 (two) times daily.  [name] ascorbic acid (VITAMIN C) 1000 MG tablet Take 1,000 mg by mouth daily.  . B Complex Vitamins (B-COMPLEX/B-12) TABS Take 1 tablet by mouth daily.   . Calcium Carbonate Antacid (TUMS PO) Take 1 tablet by mouth daily.  . Cholecalciferol (VITAMIN D3) 1000 UNITS CAPS Take 2,000 mg by mouth daily.  [name] co-enzyme Q-10 30 MG capsule Take 7 capsules (210 mg total) by mouth 3 (three) times daily.  [name] diltiazem  (CARDIZEM CD) 240 MG 24 hr capsule Take 1 capsule (240 mg total) by mouth daily.  . fexofenadine (ALLEGRA) 180 MG tablet Take 180 mg by mouth daily.  . flecainide (TAMBOCOR) 50 MG tablet Take 1 tablet (50 mg total) by mouth 2 (two) times daily.  . furosemide (LASIX) 20 MG tablet Take 1 tablet (20 mg total) by mouth every other day.  . levothyroxine (SYNTHROID) 100 MCG tablet Take 1 tablet (100 mcg total) by mouth daily before breakfast.  . losartan (COZAAR) 25 MG tablet Take 1 tablet (25 mg total) by mouth daily.  . metoprolol succinate (TOPROL XL) 25 MG 24 hr tablet Take 1 tablet (25 mg total) by mouth 2 (two) times daily.  . Multiple Vitamins-Minerals (ICAPS AREDS 2 PO) Take 1 capsule by mouth 2 (two) times daily after a meal.  . potassium chloride (K-DUR) 10 MEQ tablet Take 1 tablet (10 mEq total) by mouth every other day.  . rosuvastatin (CRESTOR) 5 MG tablet Take 0.5 tablets (2.5 mg total) by mouth at bedtime.    Allergies:   Codeine; Atorvastatin; Penicillins; and Simvastatin   Social History   Social History  . Marital status: Widowed    Spouse name: N/A  . Number of children: N/A  . Years of education: N/A   Social History Main Topics  . Smoking status: Never Smoker  . Smokeless tobacco: Never Used  . Alcohol use 0.0 oz/week     Comment: every day glass of wine  . Drug use: No  . Sexual activity: Not Asked   Other Topics Concern  . None   Social History Narrative  . None    Family History: The patient's family history includes Alcoholism in her father; Asthma in her brother and sister; Diabetes in her brother; Heart attack in her brother; Stroke in her father and mother; Thyroid disease in her mother, sister, and sister. ROS:   Please see the history of present illness.     All other systems reviewed and are negative.  EKGs/Labs/Other Studies Reviewed:    The following studies were reviewed today:  EKG:  EKG is not ordered today.    Recent Labs: 07/06/2017: ALT  23 07/07/2017: TSH 1.206 08/21/2017: BUN 8; Creatinine, Ser 0.66; Hemoglobin 13.4; Platelets 266; Potassium 3.5; Sodium 138  Recent Lipid Panel    Component Value Date/Time   CHOL 192 07/07/2017 0413   TRIG 66 07/07/2017 0413   HDL 55 07/07/2017 0413   CHOLHDL 3.5 07/07/2017 0413   VLDL 13 07/07/2017 0413   LDLCALC 124 (H) 07/07/2017 0413   Physical Exam:    VS:  BP 124/72   Pulse 77   Ht _0  (1.549 m)   Wt 146  lb (66.2 kg)   LMP  (LMP Unknown)   SpO2 97%   BMI 27.59 kg/m     Wt Readings from Last 3 Encounters:  09/18/17 146 lb (66.2 kg)  08/26/17 148 lb 6.4 oz (67.3 kg)  08/13/17 146 lb (66.2 kg)    GEN:  Well nourished, well developed in no acute distress HEENT: Normal NECK: No JVD; No carotid bruits LYMPHATICS: No lymphadenopathy CARDIAC: iRRR, no murmurs, rubs, gallops RESPIRATORY:  Clear to auscultation without rales, wheezing or rhonchi  ABDOMEN: Soft, non-tender, non-distended MUSCULOSKELETAL:  No edema; No deformity  SKIN: Warm and dry NEUROLOGIC:  Alert and oriented x 3 PSYCHIATRIC:  Normal affect   ASSESSMENT:    1. Permanent atrial fibrillation ([place])   2. Chronic diastolic heart failure ([place])   3. Essential hypertension   4. Pure hypercholesterolemia    PLAN:    In order of problems listed above:  1. The patient is scheduled for cardioversion on September 20, 2017, she is advised to continue taking Eliquis even the morning of the procedure including flecainide and metoprolol to hold other medications that morning and restart at night. 2. She is scheduled to follow with Laroy Apple in A. fib clinic on September 26, 2017. 3. She appears euvolemic I would continue Lasix 20 mg daily, she is advised about low sodium diet and adding an additional Lasix 20 mg in the early afternoon on days when her legs are more swollen. 4. Her blood pressure is well controlled. 5. She is strongly advised to start taking Crestor 5 mg 3 times a week as her LDL is 124 and she has  history of stroke.  She is offered to have lipids and LFTs rechecked in 2 months, however she wants to do it with her primary care physician.  Medication Adjustments/Labs and Tests Ordered: Current medicines are reviewed at length with the patient today.  Concerns regarding medicines are outlined above.  No orders of the defined types were placed in this encounter.  Follow-up in 3 months, the patient states that she was very upset that she was unable to see me after she was discharged from the hospital in August and instead was seen by a PA.  Because of that she would like to be switched to Dr. Marlou Porch or Johnsie Cancel that she feels they would have more availability.  I am perfectly fine with the switch.  Signed, Ena Dawley, MD  09/18/2017 1:34 PM    [facility]

## 2017-09-20 NOTE — Anesthesia Procedure Notes (Signed)
Procedure Name: General with mask airway Date/Time: 09/20/2017 1:32 PM Performed by: Everlean Cherry A Pre-anesthesia Checklist: Patient identified, Emergency Drugs available, Suction available and Patient being monitored Patient Re-evaluated:Patient Re-evaluated prior to induction Oxygen Delivery Method: Ambu bag Preoxygenation: Pre-oxygenation with 100% oxygen

## 2017-09-20 NOTE — Interval H&P Note (Signed)
History and Physical Interval Note:  09/20/2017 12:58 PM  Carolyn Vazquez  has presented today for surgery, with the diagnosis of AFIB  The various methods of treatment have been discussed with the patient and family. After consideration of risks, benefits and other options for treatment, the patient has consented to  Procedure(s): CARDIOVERSION (N/A) as a surgical intervention .  The patient's history has been reviewed, patient examined, no change in status, stable for surgery.  I have reviewed the patient's chart and labs.  Questions were answered to the patient's satisfaction.     Ena Dawley

## 2017-09-20 NOTE — Anesthesia Preprocedure Evaluation (Signed)
Anesthesia Evaluation  Patient identified by MRN, date of birth, ID band Patient awake    Reviewed: Allergy & Precautions, NPO status , Patient's Chart, lab work & pertinent test results  Airway Mallampati: II  TM Distance: >3 FB Neck ROM: Full    Dental  (+) Partial Upper   Pulmonary neg pulmonary ROS,    Pulmonary exam normal breath sounds clear to auscultation       Cardiovascular hypertension, Pt. on medications + dysrhythmias Atrial Fibrillation  Rhythm:Irregular Rate:Normal  Mild dilation of ascending aorta  ECG: A-fib, rate 97  Low normal LV systolic function; mild LVH; mildly dilated ascending aorta (40 mm); mild MR; mild LAE; trace TR with mildly elevated pulmonary pressure.    Neuro/Psych TIACVA, No Residual Symptoms negative psych ROS   GI/Hepatic negative GI ROS, Neg liver ROS,   Endo/Other  Hypothyroidism   Renal/GU      Musculoskeletal negative musculoskeletal ROS (+)   Abdominal Normal abdominal exam  (+)   Peds  Hematology negative hematology ROS (+)   Anesthesia Other Findings   Reproductive/Obstetrics                             Anesthesia Physical  Anesthesia Plan  ASA: III  Anesthesia Plan: General   Post-op Pain Management:    Induction: Intravenous  PONV Risk Score and Plan: 3 and Treatment may vary due to age or medical condition and Propofol infusion  Airway Management Planned: Natural Airway and Simple Face Mask  Additional Equipment:   Intra-op Plan:   Post-operative Plan:   Informed Consent: I have reviewed the patients History and Physical, chart, labs and discussed the procedure including the risks, benefits and alternatives for the proposed anesthesia with the patient or authorized representative who has indicated his/her understanding and acceptance.   Dental advisory given  Plan Discussed with: CRNA and Surgeon  Anesthesia Plan  Comments:         Anesthesia Quick Evaluation

## 2017-09-20 NOTE — CV Procedure (Signed)
    Cardioversion Note  BRITTNY SPANGLE [id number] January 10, 1941  Procedure: DC Cardioversion Indications: atrial fibrillation  Procedure Details Consent: Obtained Time Out: Verified patient identification, verified procedure, site/side was marked, verified correct patient position, special equipment/implants available, Radiology Safety Procedures followed,  medications/allergies/relevent history reviewed, required imaging and test results available.  Performed  The patient has been on adequate anticoagulation.  The patient received IV Lidocaine and propofol administered by anesthesia staff for sedation.  Synchronous cardioversion was performed at 120 joules.  The cardioversion was successful.   Complications: No apparent complications Patient did tolerate procedure well.   Ena Dawley, MD, [facility] 09/20/2017, 1:47 PM

## 2017-09-20 NOTE — Transfer of Care (Signed)
Immediate Anesthesia Transfer of Care Note  Patient: Carolyn Vazquez  Procedure(s) Performed: CARDIOVERSION (N/A )  Patient Location: Endoscopy Unit  Anesthesia Type:MAC  Level of Consciousness: drowsy and patient cooperative  Airway & Oxygen Therapy: Patient Spontanous Breathing and Patient connected to nasal cannula oxygen  Post-op Assessment: Report given to RN and Post -op Vital signs reviewed and stable  Post vital signs: Reviewed and stable  Last Vitals:  Vitals:   09/20/17 1335 09/20/17 1336  BP:    Pulse: 63 66  Resp: (!) 28 20  Temp:    SpO2: 98% 94%    Last Pain:  Vitals:   09/20/17 1241  TempSrc: Oral         Complications: No apparent anesthesia complications

## 2017-09-20 NOTE — Discharge Instructions (Signed)
Electrical Cardioversion, Care After °This sheet gives you information about how to care for yourself after your procedure. Your health care provider may also give you more specific instructions. If you have problems or questions, contact your health care provider. °What can I expect after the procedure? °After the procedure, it is common to have: °· Some redness on the skin where the shocks were given. ° °Follow these instructions at home: °· Do not drive for 24 hours if you were given a medicine to help you relax (sedative). °· Take over-the-counter and prescription medicines only as told by your health care provider. °· Ask your health care provider how to check your pulse. Check it often. °· Rest for 48 hours after the procedure or as told by your health care provider. °· Avoid or limit your caffeine use as told by your health care provider. °Contact a health care provider if: °· You feel like your heart is beating too quickly or your pulse is not regular. °· You have a serious muscle cramp that does not go away. °Get help right away if: °· You have discomfort in your chest. °· You are dizzy or you feel faint. °· You have trouble breathing or you are short of breath. °· Your speech is slurred. °· You have trouble moving an arm or leg on one side of your body. °· Your fingers or toes turn cold or blue. °This information is not intended to replace advice given to you by your health care provider. Make sure you discuss any questions you have with your health care provider. °Document Released: 08/26/2013 Document Revised: 06/08/2016 Document Reviewed: 05/11/2016 °Elsevier Interactive Patient Education © 2018 [employer] Inc[employer] ° °

## 2017-09-22 ENCOUNTER — Encounter (HOSPITAL_COMMUNITY): Payer: Self-pay | Admitting: Cardiology

## 2017-09-26 ENCOUNTER — Encounter (HOSPITAL_COMMUNITY): Payer: Self-pay | Admitting: Nurse Practitioner

## 2017-09-26 ENCOUNTER — Ambulatory Visit (HOSPITAL_COMMUNITY)
Admission: RE | Admit: 2017-09-26 | Discharge: 2017-09-26 | Disposition: A | Discharge status: Home / Self Care | Payer: Medicare Other | Source: Ambulatory Visit | Attending: Nurse Practitioner | Admitting: Nurse Practitioner

## 2017-09-26 VITALS — BP 128/82 | HR 98 | Ht 61.0 in | Wt 149.0 lb

## 2017-09-26 DIAGNOSIS — E785 Hyperlipidemia, unspecified: Secondary | ICD-10-CM | POA: Diagnosis not present

## 2017-09-26 DIAGNOSIS — Z885 Allergy status to narcotic agent status: Secondary | ICD-10-CM | POA: Diagnosis not present

## 2017-09-26 DIAGNOSIS — Z90710 Acquired absence of both cervix and uterus: Secondary | ICD-10-CM | POA: Insufficient documentation

## 2017-09-26 DIAGNOSIS — Z7901 Long term (current) use of anticoagulants: Secondary | ICD-10-CM | POA: Diagnosis not present

## 2017-09-26 DIAGNOSIS — E039 Hypothyroidism, unspecified: Secondary | ICD-10-CM | POA: Insufficient documentation

## 2017-09-26 DIAGNOSIS — Z9049 Acquired absence of other specified parts of digestive tract: Secondary | ICD-10-CM | POA: Diagnosis not present

## 2017-09-26 DIAGNOSIS — Z8673 Personal history of transient ischemic attack (TIA), and cerebral infarction without residual deficits: Secondary | ICD-10-CM | POA: Insufficient documentation

## 2017-09-26 DIAGNOSIS — I481 Persistent atrial fibrillation: Secondary | ICD-10-CM | POA: Diagnosis not present

## 2017-09-26 DIAGNOSIS — M3130 Wegener's granulomatosis without renal involvement: Secondary | ICD-10-CM | POA: Diagnosis not present

## 2017-09-26 DIAGNOSIS — N189 Chronic kidney disease, unspecified: Secondary | ICD-10-CM | POA: Insufficient documentation

## 2017-09-26 DIAGNOSIS — I130 Hypertensive heart and chronic kidney disease with heart failure and stage 1 through stage 4 chronic kidney disease, or unspecified chronic kidney disease: Secondary | ICD-10-CM | POA: Diagnosis not present

## 2017-09-26 DIAGNOSIS — I484 Atypical atrial flutter: Secondary | ICD-10-CM | POA: Diagnosis not present

## 2017-09-26 DIAGNOSIS — Z79899 Other long term (current) drug therapy: Secondary | ICD-10-CM | POA: Insufficient documentation

## 2017-09-26 DIAGNOSIS — Z880 Allergy status to penicillin: Secondary | ICD-10-CM | POA: Diagnosis not present

## 2017-09-26 DIAGNOSIS — I4819 Other persistent atrial fibrillation: Secondary | ICD-10-CM

## 2017-09-26 DIAGNOSIS — Z888 Allergy status to other drugs, medicaments and biological substances status: Secondary | ICD-10-CM | POA: Diagnosis not present

## 2017-09-26 DIAGNOSIS — I5032 Chronic diastolic (congestive) heart failure: Secondary | ICD-10-CM | POA: Diagnosis not present

## 2017-09-26 MED ORDER — APIXABAN 5 MG PO TABS: 5.0000 mg | ORAL_TABLET | Freq: Two times a day (BID) | ORAL | 11 refills | Status: DC

## 2017-09-26 NOTE — Progress Notes (Signed)
Primary Care Physician: [name], MD Referring Physician: Melina Copa, PA Cardiologist: Dr. Meda Coffee (prior Dr. Mare Ferrari pt)    Carolyn Vazquez is a 76 y.o. female with a h/o hypothyroidism, HTN, Wegner's granulomatosis, CKD, that presented to [facility] 1/18 with nausea, H/A, slurred speech and drooling of salvia.She was also found to be in atrial  fibrillation, new onset. CT showed new strokes in the rt frontal and rt occipital areas. She was started on Eliquis 5 mg bid. She was seen by Dr. Burt Knack in the hospital who recommended 3 weeks of anticoagulation and outpt cardioversion. She was seen by Melina Copa, 9/5,  and rate meds were adjusted as pt was still running fast. Toprol XL was increased to bid and cardizem 240 mg daily was added in the hospital.  She was asked to f/u in the afib clinic, 9/13. Pt continues in afib at 97 bpm. She does not feel her heart beating irregular, but does notice some fatigue. She has been on Eliquis since 9/19 and has met the three week requirement for cardioversion. No missed doses. She does not seem to have any neuro deficits form recent stroke. She states that several weeks before she had her stroke, another doctor had noted irregular heart beat and asked her to see cardiology. When seen by cardiology , EKG showed NSR. She had been scheduled to get an event monitor but it determined not to be  needed when atrial fib was presenet at time of admission, 8/18.[name]  Pt is in afib for f/u ,9/25, and unfortunately she had successful cardioversion, 9/17, but had early return of afib. She is back in the office with afib with v rate of 98 bpm. She feels tired in afib. V. Rate at 98 bpm.  F/u in afib opffice, 10/8, she has been started on flecainide and now is up to 100 mg flecainide bid. She remains in afib. We discussed pursuing cardioversion again to restore SR and she is in agreement. No missed doses of Eliquis. She has noted that her ankles have mild edema  but no significant weight gain.  F/u in afib clinic, 11/8. Unfortunately, pt had successful cardioversion but went back out of rhythm at some point. EKG shows atrial flutter, she feels jittery today. She did not tolerate 100 mg of flecainide well, she felt poorly so she was cardioverted  on 50 mg bid of flecainide.  Today, she denies symptoms of palpitations, chest pain, shortness of breath, orthopnea, PND , dizziness, presyncope, syncope, or neurologic sequela. Positive for  fatigue. The patient is tolerating medications without difficulties and is otherwise without complaint today.   Past Medical History:  Diagnosis Date  . Chronic diastolic CHF (congestive heart failure) ([place])   . Hyperlipidemia   . Hypertension   . Hypothyroidism   . Mild dilation of ascending aorta (HCC)   . Persistent atrial fibrillation ([place])    a. dx 06/2017.  . Stroke (cerebrum) ([place])   . 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 Wegener's granulomatosis ([place])    Past Surgical History:  Procedure Laterality Date  . ABDOMINAL HYSTERECTOMY  1979  . BUNIONECTOMY Right   . CHOLECYSTECTOMY    . 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 NASAL SINUS SURGERY      Current Outpatient Medications  Medication Sig Dispense Refill  . acetaminophen (TYLENOL) 325 MG tablet Take 325 mg by mouth daily as needed. Mild pain    . apixaban (ELIQUIS) 5  MG TABS tablet Take 1 tablet (5 mg total) 2 (two) times daily by mouth. 60 tablet 11  . ascorbic acid (VITAMIN C) 1000 MG tablet Take 1,000 mg by mouth daily.    . B Complex Vitamins (B-COMPLEX/B-12) TABS Take 1 tablet by mouth daily.     . Calcium Carbonate Antacid (TUMS PO) Take 1 tablet by mouth daily.    . Cholecalciferol (VITAMIN D3) 1000 UNITS CAPS Take 2,000 mg by mouth daily.    [name] co-enzyme Q-10 30 MG capsule Take 7 capsules (210 mg total) by mouth 3 (three) times daily. 60 capsule 5  . diltiazem (CARDIZEM CD) 240 MG 24 hr capsule Take 1 capsule (240 mg total) by  mouth daily. 30 capsule 3  . fexofenadine (ALLEGRA) 180 MG tablet Take 180 mg by mouth daily.    . furosemide (LASIX) 20 MG tablet Take 1 tablet (20 mg total) by mouth every other day. 30 tablet 0  . levothyroxine (SYNTHROID) 100 MCG tablet Take 1 tablet (100 mcg total) by mouth daily before breakfast.    . losartan (COZAAR) 25 MG tablet Take 1 tablet (25 mg total) by mouth daily. 30 tablet 3  . metoprolol succinate (TOPROL XL) 25 MG 24 hr tablet Take 1 tablet (25 mg total) by mouth 2 (two) times daily. 180 tablet 1  . Multiple Vitamins-Minerals (ICAPS AREDS 2 PO) Take 1 capsule by mouth 2 (two) times daily after a meal.    . potassium chloride (K-DUR) 10 MEQ tablet Take 1 tablet (10 mEq total) by mouth every other day. 15 tablet 3  . rosuvastatin (CRESTOR) 5 MG tablet Take 0.5 tablets (2.5 mg total) by mouth at bedtime. 30 tablet 5  . silver sulfADIAZINE (SILVADENE) 1 % cream Apply 1 application topically 2 (two) times daily. Apply twice daily on burns.. 50 g 0   No current facility-administered medications for this encounter.     Allergies  Allergen Reactions  . Codeine Other (See Comments)    Patient isn't sure of reaction. Happens years ago - 36's  . Atorvastatin Other (See Comments)    Depression   . Penicillins Rash    Happened in 1960s Has patient had a PCN reaction causing immediate rash, facial/tongue/throat swelling, SOB or lightheadedness with hypotension: Unknown Has patient had a PCN reaction causing severe rash involving mucus membranes or skin necrosis: Unknown Has patient had a PCN reaction that required hospitalization: Unknown Has patient had a PCN reaction occurring within the last 10 years: No If all of the above answers are "NO", then may proceed with Cephalosporin use.   . Simvastatin Other (See Comments)    JOINT PAIN    Social History   Socioeconomic History  . Marital status: Widowed    Spouse name: Not on file  . Number of children: Not on file  .  Years of education: Not on file  . Highest education level: Not on file  Social Needs  . Financial resource strain: Not on file  . Food insecurity - worry: Not on file  . Food insecurity - inability: Not on file  . Transportation needs - medical: Not on file  . Transportation needs - non-medical: Not on file  Occupational History  . Not on file  Tobacco Use  . Smoking status: Never Smoker  . Smokeless tobacco: Never Used  Substance and Sexual Activity  . Alcohol use: Yes    Alcohol/week: 0.0 oz    Comment: every day glass of wine  . Drug  use: No  . Sexual activity: Not on file  Other Topics Concern  . Not on file  Social History Narrative  . Not on file    Family History  Problem Relation Age of Onset  . Stroke Mother   . Thyroid disease Mother   . Stroke Father   . Alcoholism Father   . Asthma Sister   . Thyroid disease Sister   . Diabetes Brother   . Thyroid disease Sister   . Asthma Brother   . Heart attack Brother     ROS- All systems are reviewed and negative except as per the HPI above  Physical Exam: Vitals:   09/26/17 1431  BP: 128/82  Pulse: 98  Weight: 149 lb (67.6 kg)  Height: '5\' 1"'  (1.549 m)   Wt Readings from Last 3 Encounters:  09/26/17 149 lb (67.6 kg)  09/20/17 146 lb (66.2 kg)  09/18/17 146 lb (66.2 kg)    Labs: Lab Results  Component Value Date   NA 142 09/18/2017   K 4.0 09/18/2017   CL 100 09/18/2017   CO2 26 09/18/2017   GLUCOSE 106 (H) 09/18/2017   BUN 10 09/18/2017   CREATININE 0.75 09/18/2017   CALCIUM 9.5 09/18/2017   No results found for: INR Lab Results  Component Value Date   CHOL 192 07/07/2017   HDL 55 07/07/2017   LDLCALC 124 (H) 07/07/2017   TRIG 66 07/07/2017     GEN- The patient is well appearing, alert and oriented x 3 today.   Head- normocephalic, atraumatic Eyes-  Sclera clear, conjunctiva pink Ears- hearing intact Oropharynx- clear Neck- supple, no JVP Lymph- no cervical lymphadenopathy Lungs-  Clear to ausculation bilaterally, normal work of breathing Heart- irregular rate and rhythm, no murmurs, rubs or gallops, PMI not laterally displaced GI- soft, NT, ND, + BS Extremities- no clubbing, cyanosis, or edema MS- no significant deformity or atrophy Skin- no rash or lesion Psych- euthymic mood, full affect Neuro- strength and sensation are intact  EKG-atrial flutter at 98 bpm, qrs int 108 ms, qtc 480 ms Epic records reviewed Echo 8/2018Study Conclusions  - Left ventricle: The cavity size was normal. Wall thickness was   increased in a pattern of mild LVH. Systolic function was normal.   The estimated ejection fraction was in the range of 50% to 55%.   Wall motion was normal; there were no regional wall motion   abnormalities. - Ascending aorta: The ascending aorta was mildly dilated. - Mitral valve: Calcified annulus. There was mild regurgitation. - Left atrium: The atrium was mildly dilated. - Pulmonary arteries: Systolic pressure was mildly increased. PA   peak pressure: 32 mm Hg (S).  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 Stress test 2016- Notes Recorded by Darlin Coco, MD on 12/31/2014 at 9:18 AM Please report. The nuclear stress test was normal. No evidence of ischemia. The ejection fraction was normal at 68%.    Assessment and Plan: 1. Persistent symptomatic afib Successful cardioversion, but ERAF x 2 Second cardioversion initially  successful but then failed with flecainide on board Stop flecainide as it is not keeping her in rhythm  Discussed options, ablation, tikosyn or sotalol New stroke with new onset of afib August 2018 Now on anticoagulation, continue Eliquis 5 mg bid for chadsvasc score of at least 7, no missed doses Continue cardizem 240 mg  daily Continue metoprolol without change Continue lasix 20 mg every  other day   She will think options over, discuss with daughter, check on price of dofetilide and let me know how to proceed   Butch [name] C. Whitley Patchen, [facility] [address] [place], [place] [address] [phone]

## 2017-09-26 NOTE — Patient Instructions (Signed)
Stop flecainide 

## 2017-10-15 ENCOUNTER — Other Ambulatory Visit (HOSPITAL_COMMUNITY): Payer: Self-pay | Admitting: Nurse Practitioner

## 2017-10-21 ENCOUNTER — Encounter: Payer: Self-pay | Admitting: Internal Medicine

## 2017-10-21 ENCOUNTER — Ambulatory Visit: Payer: Medicare Other | Admitting: Internal Medicine

## 2017-10-21 VITALS — BP 140/92 | HR 96 | Ht 61.5 in | Wt 148.0 lb

## 2017-10-21 DIAGNOSIS — I4819 Other persistent atrial fibrillation: Secondary | ICD-10-CM

## 2017-10-21 DIAGNOSIS — I481 Persistent atrial fibrillation: Secondary | ICD-10-CM

## 2017-10-21 NOTE — Patient Instructions (Addendum)
Medication Instructions:  Your physician recommends that you continue on your current medications as directed. Please refer to the Current Medication list given to you today.   Labwork: None ordered   Testing/Procedures: None ordered   Follow-Up: Your physician recommends that you schedule a follow-up appointment as needed   Any Other Special Instructions Will Be Listed Below (If Applicable).  Call Marzetta Board if you decide you want to try Tikosyn  [phone]    If you need a refill on your cardiac medications before your next appointment, please call your pharmacy.

## 2017-10-21 NOTE — Progress Notes (Signed)
Electrophysiology Office Note   Date:  10/21/2017   ID:  Carolyn, Vazquez April 21, 1941, MRN [id number]  PCP:  [name], MD  Cardiologist:  Dr Meda Coffee Primary Electrophysiologist: Thompson Grayer, MD    Chief Complaint  Patient presents with  . Atrial Fibrillation     History of Present Illness: Carolyn Vazquez is a 76 y.o. female who presents today for electrophysiology evaluation.   She is referred by Dr Meda Coffee and Roderic Palau NP in the AF clinic for EP consultation regarding her atrial fibrillation.  She has been in afib since August when she presented with new onset stroke.  She was placed on eliquis and rate controlled.  She has been followed by Dr Meda Coffee and the AF clinic since that time. She has failed cardioversion before and after initiation of flecainide.  She has fatigue and decreased exercise tolerance with her afib.  + occasional palpitations.  Today, she denies symptoms of chest pain, shortness of breath, orthopnea, PND, lower extremity edema, claudication, dizziness, presyncope, syncope, bleeding, or neurologic sequela. The patient is tolerating medications without difficulties and is otherwise without complaint today.    Past Medical History:  Diagnosis Date  . Chronic diastolic CHF (congestive heart failure) ([place])   . Hyperlipidemia   . Hypertension   . Hypothyroidism   . Mild dilation of ascending aorta (HCC)   . Persistent atrial fibrillation ([place])    a. dx 06/2017.  . Stroke (cerebrum) ([place])   . 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 Wegener's granulomatosis ([place])    Past Surgical History:  Procedure Laterality Date  . ABDOMINAL HYSTERECTOMY  1979  . BUNIONECTOMY Right   . CARDIOVERSION N/A 08/05/2017   Procedure: CARDIOVERSION;  Surgeon: [name], MD;  Location: [facility] ENDOSCOPY;  Service: Cardiovascular;  Laterality: N/A;  . CARDIOVERSION N/A 09/20/2017   Procedure: CARDIOVERSION;  Surgeon: [name], MD;  Location: [place];  Service: Cardiovascular;  Laterality: N/A;  . CHOLECYSTECTOMY    . 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 NASAL SINUS SURGERY       Current Outpatient Medications  Medication Sig Dispense Refill  . acetaminophen (TYLENOL) 325 MG tablet Take 325 mg by mouth daily as needed. Mild pain    . apixaban (ELIQUIS) 5 MG TABS tablet Take 1 tablet (5 mg total) 2 (two) times daily by mouth. 60 tablet 11  . B Complex Vitamins (B-COMPLEX/B-12) TABS Take 1 tablet by mouth daily.     . Calcium Carbonate Antacid (TUMS PO) Take 1 tablet by mouth daily as needed (as directed).     . Cholecalciferol (VITAMIN D3) 1000 UNITS CAPS Take 2,000 mg by mouth daily.    [name] co-enzyme Q-10 30 MG capsule Take 30 mg by mouth 3 (three) times a week.    . diltiazem (CARDIZEM CD) 240 MG 24 hr capsule Take 1 capsule (240 mg total) by mouth daily. 30 capsule 3  . fexofenadine (ALLEGRA) 180 MG tablet Take 180 mg by mouth daily.    . furosemide (LASIX) 20 MG tablet TAKE 1 TABLET BY MOUTH EVERY OTHER DAY 30 tablet 3  . levothyroxine (SYNTHROID) 100 MCG tablet Take 1 tablet (100 mcg total) by mouth daily before breakfast.    . losartan (COZAAR) 25 MG tablet Take 1 tablet (25 mg total) by mouth daily. 30 tablet 3  . metoprolol succinate (TOPROL XL) 25 MG 24 hr tablet Take 1 tablet (25 mg total) by mouth  2 (two) times daily. 180 tablet 1  . potassium chloride (K-DUR) 10 MEQ tablet Take 1 tablet (10 mEq total) by mouth every other day. 15 tablet 3  . rosuvastatin (CRESTOR) 5 MG tablet Take 5 mg by mouth 3 (three) times a week.     No current facility-administered medications for this visit.     Allergies:   Codeine; Atorvastatin; Penicillins; and Simvastatin   Social History:  The patient  reports that  has never smoked. she has never used smokeless tobacco. She reports that she drinks alcohol. She reports that she does not use drugs.   Family History:  The patient's  family history includes Alcoholism in her  father; Asthma in her brother and sister; Diabetes in her brother; Heart attack in her brother; Stroke in her father and mother; Thyroid disease in her mother, sister, and sister.    ROS:  Please see the history of present illness.   All other systems are personally reviewed and negative.    PHYSICAL EXAM: VS:  BP (!) 140/92   Pulse 96   Ht 5' 1.5" (1.562 m)   Wt 148 lb (67.1 kg)   LMP  (LMP Unknown)   SpO2 97%   BMI 27.51 kg/m  , BMI Body mass index is 27.51 kg/m. GEN: Well nourished, well developed, in no acute distress  HEENT: normal  Neck: no JVD, carotid bruits, or masses Cardiac: iRRR; no murmurs, rubs, or gallops,no edema  Respiratory:  clear to auscultation bilaterally, normal work of breathing GI: soft, nontender, nondistended, + BS MS: no deformity or atrophy  Skin: warm and dry  Neuro:  Strength and sensation are intact Psych: euthymic mood, full affect  EKG:  EKG is ordered today. The ekg ordered today is personally reviewed and shows afib, V rate 96 bpm, Qtc 449 msec   Recent Labs: 07/06/2017: ALT 23 07/07/2017: TSH 1.206 09/18/2017: BUN 10; Creatinine, Ser 0.75; Hemoglobin 14.4; Platelets 264; Potassium 4.0; Sodium 142  personally reviewed   Lipid Panel     Component Value Date/Time   CHOL 192 07/07/2017 0413   TRIG 66 07/07/2017 0413   HDL 55 07/07/2017 0413   CHOLHDL 3.5 07/07/2017 0413   VLDL 13 07/07/2017 0413   LDLCALC 124 (H) 07/07/2017 0413   personally reviewed   Wt Readings from Last 3 Encounters:  10/21/17 148 lb (67.1 kg)  09/26/17 149 lb (67.6 kg)  09/20/17 146 lb (66.2 kg)      Other studies personally reviewed: Additional studies/ records that were reviewed today include: AF clinic notes, prior echo  Review of the above records today demonstrates: as above   ASSESSMENT AND PLAN:  1.  Persistent atrial fibrillation/ atrial flutter The patient has symptomatic persistent afib.  She has failed medical therapy with flecainide.  She  is s/p recent stroke and is on eliquis.  Therapeutic strategies for afib including medicine (tikosyn, sotalol, amiodarone) and ablation were discussed in detail with the patient today.  Given prior wegeners, I would like to avoid amiodarone.  Given recent stroke, I would not advise ablation at this time. I have advised rate control vs tikosyn as our options going forward.  She will think about tikosyn and contact the AF clinic if she wishes to proceed.  Once we are another 3-6 months post stroke, we could consider ablation if she continues to have issues with her afib.   Follow-up with Dr Marlou Porch as scheduled in January (she is switching from Dr Meda Coffee for reasons not  clear to me).  If she decides to try tikosyn then I am happy to assist at that time  Current medicines are reviewed at length with the patient today.   The patient does not have concerns regarding her medicines.  The following changes were made today:  none    Signed, Thompson Grayer, MD  10/21/2017 3:43 PM     [address] [place] [address] [phone] (office) [phone] (fax)

## 2017-11-14 ENCOUNTER — Other Ambulatory Visit (HOSPITAL_COMMUNITY): Payer: Self-pay | Admitting: Nurse Practitioner

## 2017-11-30 ENCOUNTER — Other Ambulatory Visit (HOSPITAL_COMMUNITY): Payer: Self-pay | Admitting: Nurse Practitioner

## 2017-12-19 ENCOUNTER — Ambulatory Visit: Payer: Medicare Other | Admitting: Cardiology

## 2017-12-19 ENCOUNTER — Encounter (INDEPENDENT_AMBULATORY_CARE_PROVIDER_SITE_OTHER): Payer: Self-pay

## 2017-12-19 ENCOUNTER — Encounter: Payer: Self-pay | Admitting: Cardiology

## 2017-12-19 VITALS — BP 112/78 | HR 95 | Ht 61.0 in | Wt 150.4 lb

## 2017-12-19 DIAGNOSIS — I482 Chronic atrial fibrillation: Secondary | ICD-10-CM

## 2017-12-19 DIAGNOSIS — M3130 Wegener's granulomatosis without renal involvement: Secondary | ICD-10-CM | POA: Diagnosis not present

## 2017-12-19 DIAGNOSIS — I4821 Permanent atrial fibrillation: Secondary | ICD-10-CM

## 2017-12-19 NOTE — Patient Instructions (Signed)
Medication Instructions:  The current medical regimen is effective;  continue present plan and medications.  Follow-Up: Follow up in 4 months with Dr Skains.  If you need a refill on your cardiac medications before your next appointment, please call your pharmacy.  Thank you for choosing [facility]!!     

## 2017-12-19 NOTE — Progress Notes (Signed)
Cardiology Office Note:    Date:  12/19/2017   ID:  Carolyn Vazquez 04/02/1941, MRN [id number]  PCP:  [name], MD  Cardiologist:  No primary care provider on file.   Referring MD: [name], MD     History of Present Illness:    Carolyn Vazquez is a 77 y.o. female previously seeing Dr. Meda Coffee (switching to me at her request because her daughter sees me), currently seeing Dr. Rayann Heman and Roderic Palau in the atrial fibrillation clinic here for follow-up.  She was referred to EP for consultation on 10/21/17 since she was in A. fib since August 2018 and presented with new onset stroke.  She was placed on Eliquis and rate controlled.  She is failed cardioversion in the past even after initiation of flecainide.  She had fatigue, decreased exercise tolerance.  She had a lengthy discussion with Dr. Rayann Heman about other medications such as Tikosyn, sotalol, amiodarone as well as ablation.  Dr. Rayann Heman mentioned that he would like to avoid amiodarone because of her prior Wegener's disease.  He also said that given her recent stroke that she would not advise ablation at this time.  Ultimately, rate control versus Tikosyn were the options going forward and she was going to think about these options.  Carolyn Vazquez is her daughter. I take care of her.   Past Medical History:  Diagnosis Date  . Chronic diastolic CHF (congestive heart failure) ([place])   . Hyperlipidemia   . Hypertension   . Hypothyroidism   . Mild dilation of ascending aorta (HCC)   . Persistent atrial fibrillation ([place])    a. dx 06/2017.  . Stroke (cerebrum) ([place])   . 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 ABDOMINAL HYSTERECTOMY  1979  . BUNIONECTOMY Right   . CARDIOVERSION N/A 08/05/2017   Procedure: CARDIOVERSION;  Surgeon: [name], MD;  Location: [facility] ENDOSCOPY;  Service: Cardiovascular;  Laterality:  N/A;  . CARDIOVERSION N/A 09/20/2017   Procedure: CARDIOVERSION;  Surgeon: [name], MD;  Location: [place];  Service: Cardiovascular;  Laterality: N/A;  . CHOLECYSTECTOMY    . 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 NASAL SINUS SURGERY      Current Medications: Current Meds  Medication Sig  . acetaminophen (TYLENOL) 325 MG tablet Take 325 mg by mouth daily as needed. Mild pain  . apixaban (ELIQUIS) 5 MG TABS tablet Take 1 tablet (5 mg total) 2 (two) times daily by mouth.  . B Complex Vitamins (B-COMPLEX/B-12) TABS Take 1 tablet by mouth daily.   . Calcium Carbonate Antacid (TUMS PO) Take 1 tablet by mouth daily as needed (as directed).   . CARTIA XT 240 MG 24 hr capsule TAKE 1 CAPSULE BY MOUTH ONCE DAILY  . Cholecalciferol (VITAMIN D3) 1000 UNITS CAPS Take 2,000 mg by mouth daily.  [name] co-enzyme Q-10 30 MG capsule Take 30 mg by mouth 3 (three) times a week.  . fexofenadine (ALLEGRA) 180 MG tablet Take 180 mg by mouth daily.  . furosemide (LASIX) 20 MG tablet TAKE 1 TABLET BY MOUTH EVERY OTHER DAY  . levothyroxine (SYNTHROID) 100 MCG tablet Take 1 tablet (100 mcg total) by mouth daily before breakfast.  . losartan (COZAAR) 25 MG tablet TAKE 1 TABLET BY MOUTH ONCE DAILY  . metoprolol succinate (TOPROL XL) 25 MG 24 hr tablet Take 1 tablet (25  mg total) by mouth 2 (two) times daily.  . potassium chloride (K-DUR) 10 MEQ tablet Take 1 tablet (10 mEq total) by mouth every other day.  . rosuvastatin (CRESTOR) 5 MG tablet Take 5 mg by mouth 3 (three) times a week.     Allergies:   Codeine; Atorvastatin; Penicillins; and Simvastatin   Social History   Socioeconomic History  . Marital status: Widowed    Spouse name: None  . Number of children: None  . Years of education: None  . Highest education level: None  Social Needs  . Financial resource strain: None  . Food insecurity - worry: None  . Food insecurity - inability: None  . Transportation needs - medical: None  .  Transportation needs - non-medical: None  Occupational History  . None  Tobacco Use  . Smoking status: Never Smoker  . Smokeless tobacco: Never Used  Substance and Sexual Activity  . Alcohol use: Yes    Alcohol/week: 0.0 oz    Comment: every day glass of wine  . Drug use: No  . Sexual activity: None  Other Topics Concern  . None  Social History Narrative  . None     Family History: The patient's family history includes Alcoholism in her father; Asthma in her brother and sister; Diabetes in her brother; Heart attack in her brother; Stroke in her father and mother; Thyroid disease in her mother, sister, and sister.  ROS:   Please see the history of present illness.     All other systems reviewed and are negative.  EKGs/Labs/Other Studies Reviewed:    The following studies were reviewed today:  ECHO 07/07/17 - Left ventricle: The cavity size was normal. Wall thickness was   increased in a pattern of mild LVH. Systolic function was normal.   The estimated ejection fraction was in the range of 50% to 55%.   Wall motion was normal; there were no regional wall motion   abnormalities. - Ascending aorta: The ascending aorta was mildly dilated. - Mitral valve: Calcified annulus. There was mild regurgitation. - Left atrium: The atrium was mildly dilated. - Pulmonary arteries: Systolic pressure was mildly increased. PA   peak pressure: 32 mm Hg (S).  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  NUC stress 12/31/14: Please report. The nuclear stress test was normal. No evidence of ischemia. The ejection fraction was normal at 68%. Please send results to PCP  EKG:  EKG is not ordered today.  Previous on 10/21/17 shows atrial fibrillation heart rate in the 90s personally viewed.  Recent Labs: 07/06/2017: ALT 23 07/07/2017: TSH 1.206 09/18/2017: BUN  10; Creatinine, Ser 0.75; Hemoglobin 14.4; Platelets 264; Potassium 4.0; Sodium 142  Recent Lipid Panel    Component Value Date/Time   CHOL 192 07/07/2017 0413   TRIG 66 07/07/2017 0413   HDL 55 07/07/2017 0413   CHOLHDL 3.5 07/07/2017 0413   VLDL 13 07/07/2017 0413   LDLCALC 124 (H) 07/07/2017 0413    Physical Exam:    VS:  BP 112/78   Pulse 95   Ht 5\' 1"  (1.549 m)   Wt 150 lb 6.4 oz (68.2 kg)   LMP  (LMP Unknown)   SpO2 (!) 82%   BMI 28.42 kg/m     Wt Readings from Last 3 Encounters:  12/19/17 150 lb 6.4 oz (68.2 kg)  10/21/17  148 lb (67.1 kg)  09/26/17 149 lb (67.6 kg)     GEN:  Well nourished, well developed in no acute distress HEENT: Normal NECK: No JVD; No carotid bruits LYMPHATICS: No lymphadenopathy CARDIAC: irreg, no murmurs, rubs, gallops RESPIRATORY:  Clear to auscultation without rales, wheezing or rhonchi  ABDOMEN: Soft, non-tender, non-distended MUSCULOSKELETAL:  No edema; No deformity  SKIN: Warm and dry NEUROLOGIC:  Alert and oriented x 3 PSYCHIATRIC:  Normal affect   ASSESSMENT:    1. 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 Permanent atrial fibrillation (HCC)    PLAN:    In order of problems listed above:  Persistent atrial fibrillation - Dr. Jackalyn Lombard note reviewed.  Tikosyn versus rate control versus ablative therapy after 3-6 months post stroke. Failed Tikosyn.  We had lengthy discussion today.  She does feel some fatigue when walking through the park for instance which [place] close to.  Some of this could be deconditioning, some of this could be A. fib.  She is failed cardioversion twice before.  At this time, we will continue with rate control strategy.  She is not terribly interested in going back into the hospital for Tikosyn, not terribly excited about the cost incurred she states.  But see how she does with rate control.  Continue with metoprolol and diltiazem.  I told her she could take an extra metoprolol if her heart rate increased  significantly. I also once again reiterated our team approach, use of nurse practitioner such as Roderic Palau if necessary.  4 month f/u  Medication Adjustments/Labs and Tests Ordered: Current medicines are reviewed at length with the patient today.  Concerns regarding medicines are outlined above.  No orders of the defined types were placed in this encounter.  No orders of the defined types were placed in this encounter.   Signed, Carolyn Furbish, MD  12/19/2017 2:45 PM    [facility]

## 2017-12-24 ENCOUNTER — Other Ambulatory Visit (HOSPITAL_COMMUNITY): Payer: Self-pay | Admitting: *Deleted

## 2017-12-24 MED ORDER — POTASSIUM CHLORIDE ER 10 MEQ PO TBCR: 10.0000 meq | EXTENDED_RELEASE_TABLET | ORAL | 6 refills | Status: DC

## 2018-03-12 ENCOUNTER — Other Ambulatory Visit: Payer: Self-pay | Admitting: *Deleted

## 2018-03-12 NOTE — Telephone Encounter (Signed)
Dr. Marlou Porch is her cardiologist.  This would be up to him.

## 2018-03-13 NOTE — Telephone Encounter (Signed)
4. Edema- On lasix 20 mg qod and KCL 10 meq qod. Previously was taking more lasix. She has significant edema and I increased her lasix to 20 mg qd. She may need to increase her KCL. She is getting labs next week with her PCP and I wrote her PCP a note regarding the increased lasix dose.

## 2018-03-14 ENCOUNTER — Telehealth: Payer: Self-pay | Admitting: Cardiology

## 2018-03-14 DIAGNOSIS — I4821 Permanent atrial fibrillation: Secondary | ICD-10-CM

## 2018-03-14 DIAGNOSIS — Z79899 Other long term (current) drug therapy: Secondary | ICD-10-CM

## 2018-03-14 DIAGNOSIS — R0600 Dyspnea, unspecified: Secondary | ICD-10-CM

## 2018-03-14 DIAGNOSIS — I10 Essential (primary) hypertension: Secondary | ICD-10-CM

## 2018-03-14 DIAGNOSIS — R0609 Other forms of dyspnea: Secondary | ICD-10-CM

## 2018-03-14 NOTE — Telephone Encounter (Signed)
Spoke with pt who is reporting bilateral swelling at feet and ankles.  This is occurring more so at night.  She has been taking Furosemide 20 mg every day.  She spoke with her PCP office yesterday and was advised to call here for further instructions.  Advised pt to keep feet and legs elevated above the level of her heart as much as possible, to decrease the NA+ in her diet to less than 2000 mg daily and to wear knee high compression stockings daily.  She is asking if she can take extra Furosemide.  Advised since we do not have recent blood work we are unable to determine if this is safe for her to do.  She reports she had recent lab at her PCP office and will call to request the results be faxed to Dr Marlou Porch.  She is requesting a call back once these have been received and reviewed.  Will await the results.

## 2018-03-14 NOTE — Telephone Encounter (Signed)
New message  Patient states she has noticed the swelling for 1 week.   1) How much weight have you gained and in what time span? UNKNOWN  2) If swelling, where is the swelling located?  LEGS, ANKLES, FEET  3) Are you currently taking a fluid pill? YES  4) Are you currently SOB? YES  5) Do you have a log of your daily weights (if so, list)? NO  6) Have you gained 3 pounds in a day or 5 pounds in a week? N/A  7) Have you traveled recently? NO

## 2018-03-21 NOTE — Telephone Encounter (Signed)
Never received lab results from pt's PCP.  Called their office and requested the results be faxed.  Last lab (CMP) was in January.  Called and left message for pt I called for the results however they are from January and she may need them repeated prior to any medication adjustments.  Requested she c/b to discuss.

## 2018-03-21 NOTE — Telephone Encounter (Signed)
New message  Pt verbalized that she is still awaiting call from RN

## 2018-03-21 NOTE — Telephone Encounter (Signed)
Spoke with patient who is aware I have called for lab results but she may need it repeated.  She does report she is still having bilateral edema.  She states she has been watching her NA+ intake but has not been utilizing knee high compression stockings as advised.  She is asking if she should/can increased Furosemide and if she should f/u with Dr Rayann Heman to discuss ablation.  Advised I will review with Dr Marlou Porch for orders and recommendations and c/b.

## 2018-03-25 NOTE — Telephone Encounter (Signed)
OK with increasing lasix to 40mg  PO QD (from 20).  Check BMET in one week OK to follow up with Dr. Rayann Heman to discuss further treatments for AFIB.  Candee Furbish, MD

## 2018-03-26 NOTE — Telephone Encounter (Signed)
Called and left message for pt of orders and to c/b to discuss and schedule.

## 2018-03-27 ENCOUNTER — Telehealth: Payer: Self-pay | Admitting: Cardiology

## 2018-03-27 MED ORDER — FUROSEMIDE 40 MG PO TABS: 40.0000 mg | ORAL_TABLET | ORAL | 3 refills | Status: DC

## 2018-03-27 NOTE — Telephone Encounter (Signed)
New message   Patient returning call to nurse to discuss Lasix and labs. Patient states if she does not answer leave detailed message, she has a 4pm appt.

## 2018-03-27 NOTE — Telephone Encounter (Signed)
Please see prior phone note.

## 2018-03-27 NOTE — Telephone Encounter (Signed)
Reviewed orders, rx sent into pharmacy as requested, pt will have BMP 5/17.  She wants to wait to see Dr Rayann Heman after she sees Dr Marlou Porch in June.  She will c/b with any concerns.

## 2018-04-04 ENCOUNTER — Other Ambulatory Visit: Payer: Medicare Other

## 2018-04-04 DIAGNOSIS — I10 Essential (primary) hypertension: Secondary | ICD-10-CM

## 2018-04-04 DIAGNOSIS — R0609 Other forms of dyspnea: Secondary | ICD-10-CM

## 2018-04-04 DIAGNOSIS — R0600 Dyspnea, unspecified: Secondary | ICD-10-CM

## 2018-04-04 DIAGNOSIS — Z79899 Other long term (current) drug therapy: Secondary | ICD-10-CM

## 2018-04-04 DIAGNOSIS — I4821 Permanent atrial fibrillation: Secondary | ICD-10-CM

## 2018-04-05 ENCOUNTER — Other Ambulatory Visit (HOSPITAL_COMMUNITY): Payer: Self-pay | Admitting: Nurse Practitioner

## 2018-04-05 LAB — BASIC METABOLIC PANEL
BUN / CREAT RATIO: 16 (ref 12–28)
BUN: 12 mg/dL (ref 8–27)
CALCIUM: 9.8 mg/dL (ref 8.7–10.3)
CHLORIDE: 100 mmol/L (ref 96–106)
CO2: 27 mmol/L (ref 20–29)
CREATININE: 0.77 mg/dL (ref 0.57–1.00)
GFR calc Af Amer: 86 mL/min/{1.73_m2} (ref >59)
GFR calc non Af Amer: 75 mL/min/{1.73_m2} (ref >59)
Glucose: 159 mg/dL — ABNORMAL HIGH (ref 65–99)
Potassium: 3.7 mmol/L (ref 3.5–5.2)
Sodium: 141 mmol/L (ref 134–144)

## 2018-04-22 ENCOUNTER — Encounter: Payer: Self-pay | Admitting: Cardiology

## 2018-04-22 ENCOUNTER — Encounter (INDEPENDENT_AMBULATORY_CARE_PROVIDER_SITE_OTHER): Payer: Self-pay

## 2018-04-22 ENCOUNTER — Ambulatory Visit: Payer: Medicare Other | Admitting: Cardiology

## 2018-04-22 VITALS — BP 130/86 | HR 111 | Ht 61.5 in | Wt 154.0 lb

## 2018-04-22 DIAGNOSIS — I481 Persistent atrial fibrillation: Secondary | ICD-10-CM

## 2018-04-22 DIAGNOSIS — I4819 Other persistent atrial fibrillation: Secondary | ICD-10-CM

## 2018-04-22 DIAGNOSIS — I10 Essential (primary) hypertension: Secondary | ICD-10-CM | POA: Diagnosis not present

## 2018-04-22 MED ORDER — FUROSEMIDE 40 MG PO TABS: 40.0000 mg | ORAL_TABLET | Freq: Every day | ORAL | 3 refills | Status: DC

## 2018-04-22 NOTE — Patient Instructions (Signed)
Medication Instructions:  The current medical regimen is effective;  continue present plan and medications.  Follow-Up: Follow up with Dr Rayann Heman to discuss ablation.  If you need a refill on your cardiac medications before your next appointment, please call your pharmacy.  Thank you for choosing [facility]!!

## 2018-04-22 NOTE — Progress Notes (Signed)
Cardiology Office Note:    Date:  04/22/2018   ID:  Carolyn, Vazquez 03-11-1941, MRN [id number]  PCP:  [name], MD  Cardiologist:  No primary care provider on file.   Referring MD: [name], MD     History of Present Illness:    Carolyn Vazquez is a 77 y.o. female previously seeing Dr. Meda Coffee (switching to me at her request because her daughter sees me), currently seeing Dr. Rayann Heman and Roderic Palau in the atrial fibrillation clinic here for follow-up.  She was referred to EP for consultation on 10/21/17 since she was in A. fib since August 2018 and presented with new onset stroke.  She was placed on Eliquis and rate controlled.  She is failed cardioversion x 2  in the past even after initiation of flecainide.  She had fatigue, decreased exercise tolerance.  She was only in sinus rhythm for a few hours after cardioversions.  She had a lengthy discussion with Dr. Rayann Heman about other medications such as Tikosyn, sotalol, amiodarone as well as ablation.  Dr. Rayann Heman mentioned that he would like to avoid amiodarone because of her prior Wegener's disease.  He also said that given her recent stroke that she would not advise ablation at this time.  Ultimately, rate control versus Tikosyn were the options going forward and she was going to think about these options.  Carolyn Vazquez is her daughter. I take care of her.   04/22/2018- overall, she would like to discuss once again ablative therapy.  She is not interested in antiarrhythmics.  She still feels highly symptomatic with her atrial fibrillation she states.  Past Medical History:  Diagnosis Date  . Chronic diastolic CHF (congestive heart failure) ([place])   . Hyperlipidemia   . Hypertension   . Hypothyroidism   . Mild dilation of ascending aorta (HCC)   . Persistent atrial fibrillation ([place])    a. dx 06/2017.  . Stroke (cerebrum) ([place])   . 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 ABDOMINAL HYSTERECTOMY  1979  . BUNIONECTOMY Right   . CARDIOVERSION N/A 08/05/2017   Procedure: CARDIOVERSION;  Surgeon: [name], MD;  Location: [facility] ENDOSCOPY;  Service: Cardiovascular;  Laterality: N/A;  . CARDIOVERSION N/A 09/20/2017   Procedure: CARDIOVERSION;  Surgeon: [name], MD;  Location: [place];  Service: Cardiovascular;  Laterality: N/A;  . CHOLECYSTECTOMY    . 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 NASAL SINUS SURGERY      Current Medications: Current Meds  Medication Sig  . acetaminophen (TYLENOL) 325 MG tablet Take 325 mg by mouth daily as needed. Mild pain  . apixaban (ELIQUIS) 5 MG TABS tablet Take 1 tablet (5 mg total) 2 (two) times daily by mouth.  . B Complex Vitamins (B-COMPLEX/B-12) TABS Take 1 tablet by mouth daily.   . Calcium Carbonate Antacid (TUMS PO) Take 1 tablet by mouth daily as needed (as directed).   . CARTIA XT 240 MG 24 hr capsule TAKE 1 CAPSULE BY MOUTH ONCE DAILY  . Cholecalciferol (VITAMIN D3) 1000 UNITS CAPS Take 2,000 mg by mouth daily.  [name] co-enzyme Q-10 30 MG capsule Take 30 mg by mouth 3 (three) times a week.  . escitalopram (LEXAPRO) 10 MG tablet Take 1 tablet by mouth daily.  . fexofenadine (ALLEGRA) 180 MG tablet Take 180 mg by mouth daily.  [name]  furosemide (LASIX) 40 MG tablet Take 1 tablet (40 mg total) by mouth every other day.  . levothyroxine (SYNTHROID) 100 MCG tablet Take 1 tablet (100 mcg total) by mouth daily before breakfast.  . losartan (COZAAR) 25 MG tablet TAKE 1 TABLET BY MOUTH ONCE DAILY  . metoprolol succinate (TOPROL XL) 25 MG 24 hr tablet Take 1 tablet (25 mg total) by mouth 2 (two) times daily.  . Multiple Vitamins-Minerals (ICAPS AREDS FORMULA PO) Take 1 capsule by mouth 2 (two) times daily.  . potassium chloride (K-DUR) 10 MEQ tablet Take 1 tablet (10 mEq total) by mouth every other day.  . rosuvastatin (CRESTOR) 5 MG tablet Take 5 mg by  mouth 3 (three) times a week.     Allergies:   Codeine; Atorvastatin; Penicillins; and Simvastatin   Social History   Socioeconomic History  . Marital status: Widowed    Spouse name: Not on file  . Number of children: Not on file  . Years of education: Not on file  . Highest education level: Not on file  Occupational History  . Not on file  Social Needs  . Financial resource strain: Not on file  . Food insecurity:    Worry: Not on file    Inability: Not on file  . Transportation needs:    Medical: Not on file    Non-medical: Not on file  Tobacco Use  . Smoking status: Never Smoker  . Smokeless tobacco: Never Used  Substance and Sexual Activity  . Alcohol use: Yes    Alcohol/week: 0.0 oz    Comment: every day glass of wine  . Drug use: No  . Sexual activity: Not on file  Lifestyle  . Physical activity:    Days per week: Not on file    Minutes per session: Not on file  . Stress: Not on file  Relationships  . Social connections:    Talks on phone: Not on file    Gets together: Not on file    Attends religious service: Not on file    Active member of club or organization: Not on file    Attends meetings of clubs or organizations: Not on file    Relationship status: Not on file  Other Topics Concern  . Not on file  Social History Narrative  . Not on file     Family History: The patient's family history includes Alcoholism in her father; Asthma in her brother and sister; Diabetes in her brother; Heart attack in her brother; Stroke in her father and mother; Thyroid disease in her mother, sister, and sister.  ROS:   Please see the history of present illness.   Minor leg edema, occasional chest fullness All other review of systems negative  EKGs/Labs/Other Studies Reviewed:    The following studies were reviewed today:  ECHO 07/07/17 - Left ventricle: The cavity size was normal. Wall thickness was   increased in a pattern of mild LVH. Systolic function was  normal.   The estimated ejection fraction was in the range of 50% to 55%.   Wall motion was normal; there were no regional wall motion   abnormalities. - Ascending aorta: The ascending aorta was mildly dilated. - Mitral valve: Calcified annulus. There was mild regurgitation. - Left atrium: The atrium was mildly dilated. - Pulmonary arteries: Systolic pressure was mildly increased. PA   peak pressure: 32 mm Hg (S).  Impressions:  - Low normal LV systolic function; mild LVH; mildly dilated   ascending aorta (  40 mm); mild MR; mild LAE; trace TR with mildly   elevated pulmonary pressure.  NUC stress 12/31/14: Please report. The nuclear stress test was normal. No evidence of ischemia. The ejection fraction was normal at 68%. Please send results to PCP  EKG:  EKG is not ordered today.  Previous on 10/21/17 shows atrial fibrillation heart rate in the 90s personally viewed.  Recent Labs: 07/06/2017: ALT 23 07/07/2017: TSH 1.206 09/18/2017: Hemoglobin 14.4; Platelets 264 04/04/2018: BUN 12; Creatinine, Ser 0.77; Potassium 3.7; Sodium 141  Recent Lipid Panel    Component Value Date/Time   CHOL 192 07/07/2017 0413   TRIG 66 07/07/2017 0413   HDL 55 07/07/2017 0413   CHOLHDL 3.5 07/07/2017 0413   VLDL 13 07/07/2017 0413   LDLCALC 124 (H) 07/07/2017 0413    Physical Exam:    VS:  BP 130/86   Pulse (!) 111   Ht 5' 1.5" (1.562 m)   Wt 154 lb (69.9 kg)   LMP  (LMP Unknown)   SpO2 95%   BMI 28.63 kg/m     Wt Readings from Last 3 Encounters:  04/22/18 154 lb (69.9 kg)  12/19/17 150 lb 6.4 oz (68.2 kg)  10/21/17 148 lb (67.1 kg)     GEN: Well nourished, well developed, in no acute distress  HEENT: normal  Neck: no JVD, carotid bruits, or masses Cardiac: IRRR; no murmurs, rubs, or gallops, trace pedal edema  Respiratory:  clear to auscultation bilaterally, normal work of breathing GI: soft, nontender, nondistended, + BS MS: no deformity or atrophy  Skin: warm and dry, no  rash Neuro:  Alert and Oriented x 3, Strength and sensation are intact Psych: euthymic mood, full affect   ASSESSMENT:    1. Persistent atrial fibrillation ([place])   2. Essential hypertension    PLAN:    In order of problems listed above:  Persistent atrial fibrillation - Dr. Jackalyn Lombard note reviewed.  Tikosyn versus rate control versus ablative therapy after 3-6 months post stroke.   We had lengthy discussion again today.  She has failed cardioversion twice before.  At this time, we will continue with rate control strategy.  She is not interested in Tikosyn.  She is on rate control strategy but she does not feel well.  She would really like to talk with Dr. Rayann Heman again about ablative therapy now that she is out post stroke for quite some time.  I will comply.  Edema - We will increase her Lasix to 40 mg once a day.  Previous blood work reassuring.  Follow-up with Dr. Rayann Heman for discussion of ablation.  Medication Adjustments/Labs and Tests Ordered: Current medicines are reviewed at length with the patient today.  Concerns regarding medicines are outlined above.  No orders of the defined types were placed in this encounter.  No orders of the defined types were placed in this encounter.   Signed, Candee Furbish, MD  04/22/2018 3:14 PM    [facility]

## 2018-05-14 ENCOUNTER — Encounter: Payer: Self-pay | Admitting: Internal Medicine

## 2018-05-14 ENCOUNTER — Ambulatory Visit: Payer: Medicare Other | Admitting: Internal Medicine

## 2018-05-14 VITALS — BP 104/72 | HR 94 | Ht 61.5 in | Wt 150.0 lb

## 2018-05-14 DIAGNOSIS — I481 Persistent atrial fibrillation: Secondary | ICD-10-CM

## 2018-05-14 DIAGNOSIS — I4892 Unspecified atrial flutter: Secondary | ICD-10-CM | POA: Diagnosis not present

## 2018-05-14 DIAGNOSIS — I4819 Other persistent atrial fibrillation: Secondary | ICD-10-CM

## 2018-05-14 DIAGNOSIS — I4891 Unspecified atrial fibrillation: Secondary | ICD-10-CM

## 2018-05-14 NOTE — Patient Instructions (Addendum)
Medication Instructions:  Your physician recommends that you continue on your current medications as directed. Please refer to the Current Medication list given to you today. If you need a refill on your cardiac medications before your next appointment, please call your pharmacy.  Labwork: You will get lab work within 30 days of your procedure:  BMP and CBC.  June 16, 2018 at [facility] office-you can come anytime that day and you do not need to be fasting.   Testing/Procedures:  Your physician has requested that you have cardiac CT. Cardiac computed tomography (CT) is a painless test that uses an x-ray machine to take clear, detailed pictures of your heart. For further information please visit [email or web address]. Please follow instruction sheet as given.  Your physician has recommended that you have an ablation. Catheter ablation is a medical procedure used to treat some cardiac arrhythmias (irregular heartbeats). During catheter ablation, a long, thin, flexible tube is put into a blood vessel in your groin (upper thigh), or neck. This tube is called an ablation catheter. It is then guided to your heart through the blood vessel. Radio frequency waves destroy small areas of heart tissue where abnormal heartbeats may cause an arrhythmia to start. Please see the instruction sheet given to you today.  Follow-Up: You will follow up with Roderic Palau, NP with the Atrial fibrillation (Afib) clinic 4 weeks after your ablation.  You will follow up with Dr. Rayann Heman 3 months after your procedure.  CARDIAC CT INSTRUCTIONS:  Please arrive at the [facility] main entrance of [facility], [place] [address] [phone]  Proceed to the [facility] Radiology Department (First Floor).  Please follow these instructions carefully (unless otherwise directed):  Hold all erectile dysfunction medications at least 48 hours prior to test.  On the  Night Before the Test: . Drink plenty of water. . Do not consume any caffeinated/decaffeinated beverages or chocolate 12 hours prior to your test. . Do not take any antihistamines 12 hours prior to your test. .  On the Day of the Test: . Drink plenty of water. Do not drink any water within one hour of the test. . Do not eat any food 4 hours prior to the test. . You may take your regular medications prior to the test. . IF NOT ON A BETA BLOCKER - Take 50 mg of lopressor (metoprolol) one hour before the test.  You are on a beta blocker.   [name] HOLD Furosemide morning of the test.  After the Test: . Drink plenty of water. . After receiving IV contrast, you may experience a mild flushed feeling. This is normal. . On occasion, you may experience a mild rash up to 24 hours after the test. This is not dangerous. If this occurs, you can take Benadryl 25 mg and increase your fluid intake. . If you experience trouble breathing, this can be serious. If it is severe call 911 IMMEDIATELY. If it is mild, please call our office.   ABLATION INSTRUCTIONS:  Please arrive at the [facility] main entrance of [place] hospital at: 5:30 am on July 08, 2018 Do not eat or drink after midnight prior to procedure On the morning of your procedure do not take any medications (this includes your Eliquis) DO NOT miss ANY doses up to the morning of your procedure Plan for one night stay.  You will need someone to drive you home at discharge.

## 2018-05-14 NOTE — Progress Notes (Signed)
PCP: [name], MD Primary Cardiologist: Dr Marlou Porch Primary EP: Dr Rayann Heman  Carolyn Vazquez is a 77 y.o. female who presents today for routine electrophysiology followup.  Since last being seen in our clinic, the patient reports doing reasonably well. She is very symptomatic with her afib.  + fatigue and decreased exercise tolerance.  Today, she denies symptoms of palpitations, chest pain, shortness of breath,  lower extremity edema, dizziness, presyncope, or syncope.  The patient is otherwise without complaint today.   Past Medical History:  Diagnosis Date  . Chronic diastolic CHF (congestive heart failure) ([place])   . Hyperlipidemia   . Hypertension   . Hypothyroidism   . Mild dilation of ascending aorta (HCC)   . Persistent atrial fibrillation ([place])    a. dx 06/2017.  . Stroke (cerebrum) ([place])   . 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 ABDOMINAL HYSTERECTOMY  1979  . BUNIONECTOMY Right   . CARDIOVERSION N/A 08/05/2017   Procedure: CARDIOVERSION;  Surgeon: [name], MD;  Location: [facility] ENDOSCOPY;  Service: Cardiovascular;  Laterality: N/A;  . CARDIOVERSION N/A 09/20/2017   Procedure: CARDIOVERSION;  Surgeon: [name], MD;  Location: [place];  Service: Cardiovascular;  Laterality: N/A;  . CHOLECYSTECTOMY    . 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 NASAL SINUS SURGERY      ROS- all systems are reviewed and negatives except as per HPI above  Current Outpatient Medications  Medication Sig Dispense Refill  . acetaminophen (TYLENOL) 325 MG tablet Take 325 mg by mouth daily as needed. Mild pain    . apixaban (ELIQUIS) 5 MG TABS tablet Take 1 tablet (5 mg total) 2 (two) times daily by mouth. 60 tablet 11  . B Complex Vitamins (B-COMPLEX/B-12) TABS Take 1 tablet by mouth daily.     . Calcium Carbonate Antacid (TUMS PO) Take 1 tablet by mouth daily as needed  (as directed).     . CARTIA XT 240 MG 24 hr capsule TAKE 1 CAPSULE BY MOUTH ONCE DAILY 90 capsule 1  . Cholecalciferol (VITAMIN D3) 1000 UNITS CAPS Take 2,000 mg by mouth daily.    [name] co-enzyme Q-10 30 MG capsule Take 30 mg by mouth 3 (three) times a week.    . escitalopram (LEXAPRO) 10 MG tablet Take 1 tablet by mouth daily.    . fexofenadine (ALLEGRA) 180 MG tablet Take 180 mg by mouth daily.    . furosemide (LASIX) 40 MG tablet Take 1 tablet (40 mg total) by mouth daily. 90 tablet 3  . levothyroxine (SYNTHROID) 100 MCG tablet Take 1 tablet (100 mcg total) by mouth daily before breakfast.    . losartan (COZAAR) 25 MG tablet TAKE 1 TABLET BY MOUTH ONCE DAILY 90 tablet 2  . metoprolol succinate (TOPROL XL) 25 MG 24 hr tablet Take 1 tablet (25 mg total) by mouth 2 (two) times daily. 180 tablet 1  . Multiple Vitamins-Minerals (ICAPS AREDS FORMULA PO) Take 1 capsule by mouth 2 (two) times daily.    . potassium chloride (K-DUR) 10 MEQ tablet Take 1 tablet (10 mEq total) by mouth every other day. 15 tablet 6  . rosuvastatin (CRESTOR) 5 MG tablet Take 5 mg by mouth 3 (three) times a week.     No current facility-administered medications for this visit.     Physical Exam: Vitals:   05/14/18 1519  Height: 5' 1.5" (1.562 m)    GEN- The patient is well appearing, alert and oriented x 3 today.   Head- normocephalic, atraumatic Eyes-  Sclera clear, conjunctiva pink Ears- hearing intact Oropharynx- clear Lungs- Clear to ausculation bilaterally, normal work of breathing Heart- Regular rate and rhythm, no murmurs, rubs or gallops, PMI not laterally displaced GI- soft, NT, ND, + BS Extremities- no clubbing, cyanosis, or edema  Wt Readings from Last 3 Encounters:  04/22/18 154 lb (69.9 kg)  12/19/17 150 lb 6.4 oz (68.2 kg)  10/21/17 148 lb (67.1 kg)    EKG tracing ordered today is personally reviewed and shows afib, V rate 94 bpm, poor r wave progression, QTc 452 msec  Assessment and  Plan:  1. Persistent atrial fibrillation/ atrial flutter The patient has symptomatic, recurrent atrial fibrillation and atrial flutter. she has failed medical therapy with flecainide. Chads2vasc score is 6.  she is anticoagulated with eliquis . Therapeutic strategies for afib including medicine and ablation were discussed in detail with the patient today. Risk, benefits, and alternatives to EP study and radiofrequency ablation for afib were also discussed in detail today. These risks include but are not limited to stroke, bleeding, vascular damage, tamponade, perforation, damage to the esophagus, lungs, and other structures, pulmonary vein stenosis, worsening renal function, and death. The patient understands these risk and wishes to proceed.  We will therefore proceed with catheter ablation at the next available time.  Carto, ICE, anesthesia are requested for the procedure.  Will also obtain Cardiac CT prior to the procedure to exclude LAA thrombus and further evaluate atrial anatomy.  2. HTN Stable No change required today   Thompson Grayer MD, [facility] 05/14/2018 3:30 PM

## 2018-06-03 ENCOUNTER — Other Ambulatory Visit (HOSPITAL_COMMUNITY): Payer: Self-pay | Admitting: Nurse Practitioner

## 2018-06-10 ENCOUNTER — Telehealth: Payer: Self-pay | Admitting: Internal Medicine

## 2018-06-10 NOTE — Telephone Encounter (Signed)
Disregard opened in error °

## 2018-06-10 NOTE — Telephone Encounter (Signed)
New Message    Patient is calling

## 2018-06-16 ENCOUNTER — Other Ambulatory Visit: Payer: Medicare Other

## 2018-06-19 ENCOUNTER — Other Ambulatory Visit: Payer: Medicare Other

## 2018-06-19 ENCOUNTER — Other Ambulatory Visit: Payer: Self-pay | Admitting: Internal Medicine

## 2018-06-20 LAB — BASIC METABOLIC PANEL
BUN/Creatinine Ratio: 13 (ref 12–28)
BUN: 10 mg/dL (ref 8–27)
CO2: 26 mmol/L (ref 20–29)
Calcium: 9.3 mg/dL (ref 8.7–10.3)
Chloride: 97 mmol/L (ref 96–106)
Creatinine, Ser: 0.78 mg/dL (ref 0.57–1.00)
GFR calc Af Amer: 84 mL/min/{1.73_m2} (ref >59)
GFR calc non Af Amer: 73 mL/min/{1.73_m2} (ref >59)
Glucose: 102 mg/dL — ABNORMAL HIGH (ref 65–99)
POTASSIUM: 3.8 mmol/L (ref 3.5–5.2)
Sodium: 139 mmol/L (ref 134–144)

## 2018-06-20 LAB — CBC WITH DIFFERENTIAL/PLATELET
Basophils Absolute: 0.1 10*3/uL (ref 0.0–0.2)
Basos: 1 %
EOS (ABSOLUTE): 0.4 10*3/uL (ref 0.0–0.4)
EOS: 5 %
HEMATOCRIT: 42.7 % (ref 34.0–46.6)
Hemoglobin: 13.7 g/dL (ref 11.1–15.9)
Immature Grans (Abs): 0 10*3/uL (ref 0.0–0.1)
Immature Granulocytes: 0 %
Lymphocytes Absolute: 2.7 10*3/uL (ref 0.7–3.1)
Lymphs: 34 %
MCH: 29.2 pg (ref 26.6–33.0)
MCHC: 32.1 g/dL (ref 31.5–35.7)
MCV: 91 fL (ref 79–97)
Monocytes Absolute: 0.8 10*3/uL (ref 0.1–0.9)
Monocytes: 11 %
NEUTROS ABS: 3.9 10*3/uL (ref 1.4–7.0)
Neutrophils: 49 %
Platelets: 285 10*3/uL (ref 150–450)
RBC: 4.69 x10E6/uL (ref 3.77–5.28)
RDW: 13.7 % (ref 12.3–15.4)
WBC: 7.8 10*3/uL (ref 3.4–10.8)

## 2018-06-20 LAB — SPECIMEN STATUS REPORT

## 2018-07-07 ENCOUNTER — Ambulatory Visit (HOSPITAL_COMMUNITY)
Admission: RE | Admit: 2018-07-07 | Discharge: 2018-07-07 | Disposition: A | Discharge status: Home / Self Care | Payer: Medicare Other | Source: Ambulatory Visit | Attending: Internal Medicine | Admitting: Internal Medicine

## 2018-07-07 ENCOUNTER — Ambulatory Visit (HOSPITAL_COMMUNITY): Admission: RE | Admit: 2018-07-07 | Payer: Medicare Other | Source: Ambulatory Visit

## 2018-07-07 DIAGNOSIS — I281 Aneurysm of pulmonary artery: Secondary | ICD-10-CM | POA: Diagnosis not present

## 2018-07-07 DIAGNOSIS — I4892 Unspecified atrial flutter: Secondary | ICD-10-CM

## 2018-07-07 DIAGNOSIS — I517 Cardiomegaly: Secondary | ICD-10-CM | POA: Diagnosis not present

## 2018-07-07 DIAGNOSIS — I4891 Unspecified atrial fibrillation: Secondary | ICD-10-CM | POA: Diagnosis not present

## 2018-07-07 DIAGNOSIS — I4819 Other persistent atrial fibrillation: Secondary | ICD-10-CM

## 2018-07-07 MED ORDER — METOPROLOL TARTRATE 5 MG/5ML IV SOLN
5.0000 mg | INTRAVENOUS | Status: DC | PRN
  Administered 2018-07-07: 5 mg via INTRAVENOUS
  Filled 2018-07-07: qty 5

## 2018-07-07 MED ORDER — IOPAMIDOL (ISOVUE-370) INJECTION 76%
INTRAVENOUS | Status: AC
  Administered 2018-07-07: 80 mL
  Filled 2018-07-07: qty 100

## 2018-07-07 MED ORDER — METOPROLOL TARTRATE 5 MG/5ML IV SOLN
INTRAVENOUS | Status: AC
  Filled 2018-07-07: qty 10

## 2018-07-07 NOTE — Anesthesia Preprocedure Evaluation (Addendum)
Anesthesia Evaluation  Patient identified by MRN, date of birth, ID band Patient awake    Reviewed: Allergy & Precautions, H&P , NPO status , Patient's Chart, lab work & pertinent test results  Airway Mallampati: II  TM Distance: >3 FB Neck ROM: full    Dental  (+) Dental Advidsory Given, Partial Upper   Pulmonary neg pulmonary ROS,    breath sounds clear to auscultation       Cardiovascular hypertension, Pt. on medications + Peripheral Vascular Disease and +CHF  + dysrhythmias (on Eliquis, last dose yesterday evening) Atrial Fibrillation  Rhythm:irregular Rate:Normal  TTE 06/2017 EF 50-55%, mild MR   Neuro/Psych CVA, No Residual Symptoms negative psych ROS   GI/Hepatic negative GI ROS, Neg liver ROS,   Endo/Other  negative endocrine ROSHypothyroidism   Renal/GU Wegener's granulomatosis  negative genitourinary   Musculoskeletal negative musculoskeletal ROS (+)   Abdominal   Peds  Hematology negative hematology ROS (+)   Anesthesia Other Findings Day of surgery medications reviewed with the patient.  Reproductive/Obstetrics negative OB ROS                           Anesthesia Physical Anesthesia Plan  ASA: III  Anesthesia Plan: General   Post-op Pain Management:    Induction: Intravenous  PONV Risk Score and Plan: 3 and Dexamethasone and Ondansetron  Airway Management Planned: Oral ETT  Additional Equipment:   Intra-op Plan:   Post-operative Plan: Extubation in OR  Informed Consent: I have reviewed the patients History and Physical, chart, labs and discussed the procedure including the risks, benefits and alternatives for the proposed anesthesia with the patient or authorized representative who has indicated his/her understanding and acceptance.   Dental Advisory Given and Dental advisory given  Plan Discussed with:   Anesthesia Plan Comments:        Anesthesia Quick  Evaluation

## 2018-07-08 ENCOUNTER — Ambulatory Visit (HOSPITAL_COMMUNITY): Payer: Medicare Other | Admitting: Anesthesiology

## 2018-07-08 ENCOUNTER — Ambulatory Visit (HOSPITAL_COMMUNITY)
Admission: RE | Admit: 2018-07-08 | Discharge: 2018-07-08 | Disposition: A | Discharge status: Home / Self Care | Payer: Medicare Other | Source: Ambulatory Visit | Attending: Internal Medicine | Admitting: Internal Medicine

## 2018-07-08 ENCOUNTER — Encounter (HOSPITAL_COMMUNITY)
Admission: RE | Disposition: A | Discharge status: Home / Self Care | Payer: Self-pay | Source: Ambulatory Visit | Attending: Internal Medicine

## 2018-07-08 DIAGNOSIS — Z9889 Other specified postprocedural states: Secondary | ICD-10-CM | POA: Insufficient documentation

## 2018-07-08 DIAGNOSIS — Z9049 Acquired absence of other specified parts of digestive tract: Secondary | ICD-10-CM | POA: Diagnosis not present

## 2018-07-08 DIAGNOSIS — I481 Persistent atrial fibrillation: Secondary | ICD-10-CM | POA: Insufficient documentation

## 2018-07-08 DIAGNOSIS — E785 Hyperlipidemia, unspecified: Secondary | ICD-10-CM | POA: Insufficient documentation

## 2018-07-08 DIAGNOSIS — Z79899 Other long term (current) drug therapy: Secondary | ICD-10-CM | POA: Insufficient documentation

## 2018-07-08 DIAGNOSIS — M3130 Wegener's granulomatosis without renal involvement: Secondary | ICD-10-CM | POA: Insufficient documentation

## 2018-07-08 DIAGNOSIS — E039 Hypothyroidism, unspecified: Secondary | ICD-10-CM | POA: Insufficient documentation

## 2018-07-08 DIAGNOSIS — I5032 Chronic diastolic (congestive) heart failure: Secondary | ICD-10-CM | POA: Diagnosis not present

## 2018-07-08 DIAGNOSIS — Z90710 Acquired absence of both cervix and uterus: Secondary | ICD-10-CM | POA: Diagnosis not present

## 2018-07-08 DIAGNOSIS — I110 Hypertensive heart disease with heart failure: Secondary | ICD-10-CM | POA: Insufficient documentation

## 2018-07-08 DIAGNOSIS — Z7902 Long term (current) use of antithrombotics/antiplatelets: Secondary | ICD-10-CM | POA: Diagnosis not present

## 2018-07-08 DIAGNOSIS — Z79890 Hormone replacement therapy (postmenopausal): Secondary | ICD-10-CM | POA: Diagnosis not present

## 2018-07-08 DIAGNOSIS — I483 Typical atrial flutter: Secondary | ICD-10-CM | POA: Diagnosis not present

## 2018-07-08 DIAGNOSIS — Z8673 Personal history of transient ischemic attack (TIA), and cerebral infarction without residual deficits: Secondary | ICD-10-CM | POA: Insufficient documentation

## 2018-07-08 HISTORY — PX: ATRIAL FIBRILLATION ABLATION: EP1191

## 2018-07-08 LAB — POCT ACTIVATED CLOTTING TIME
ACTIVATED CLOTTING TIME: 186 s
Activated Clotting Time: 257 seconds
Activated Clotting Time: 296 seconds
Activated Clotting Time: 318 seconds
Activated Clotting Time: 318 seconds
Activated Clotting Time: 334 seconds

## 2018-07-08 SURGERY — ATRIAL FIBRILLATION ABLATION
Anesthesia: General

## 2018-07-08 MED ORDER — ONDANSETRON HCL 4 MG/2ML IJ SOLN
INTRAMUSCULAR | Status: DC | PRN
  Administered 2018-07-08: 4 mg via INTRAVENOUS

## 2018-07-08 MED ORDER — HEPARIN SODIUM (PORCINE) 1000 UNIT/ML IJ SOLN
INTRAMUSCULAR | Status: AC
  Filled 2018-07-08: qty 1

## 2018-07-08 MED ORDER — SODIUM CHLORIDE 0.9 % IV SOLN: 250.0000 mL | INTRAVENOUS | Status: DC | PRN

## 2018-07-08 MED ORDER — HYDROCODONE-ACETAMINOPHEN 5-325 MG PO TABS: 1.0000 | ORAL_TABLET | ORAL | Status: DC | PRN

## 2018-07-08 MED ORDER — ISOPROTERENOL HCL 0.2 MG/ML IJ SOLN
INTRAMUSCULAR | Status: AC
  Filled 2018-07-08: qty 5

## 2018-07-08 MED ORDER — SUGAMMADEX SODIUM 200 MG/2ML IV SOLN
INTRAVENOUS | Status: DC | PRN
  Administered 2018-07-08: 200 mg via INTRAVENOUS

## 2018-07-08 MED ORDER — DEXAMETHASONE SODIUM PHOSPHATE 10 MG/ML IJ SOLN
INTRAMUSCULAR | Status: DC | PRN
  Administered 2018-07-08: 10 mg via INTRAVENOUS

## 2018-07-08 MED ORDER — SODIUM CHLORIDE 0.9 % IV SOLN
INTRAVENOUS | Status: DC | PRN
  Administered 2018-07-08: 25 ug/min via INTRAVENOUS

## 2018-07-08 MED ORDER — PHENYLEPHRINE HCL 10 MG/ML IJ SOLN
INTRAMUSCULAR | Status: DC | PRN
  Administered 2018-07-08: 40 ug via INTRAVENOUS

## 2018-07-08 MED ORDER — SODIUM CHLORIDE 0.9% FLUSH: 3.0000 mL | INTRAVENOUS | Status: DC | PRN

## 2018-07-08 MED ORDER — IOPAMIDOL (ISOVUE-370) INJECTION 76%
INTRAVENOUS | Status: AC
  Filled 2018-07-08: qty 50

## 2018-07-08 MED ORDER — APIXABAN 5 MG PO TABS
5.0000 mg | ORAL_TABLET | Freq: Once | ORAL | Status: AC
  Administered 2018-07-08: 5 mg via ORAL
  Filled 2018-07-08: qty 1

## 2018-07-08 MED ORDER — PROPOFOL 10 MG/ML IV BOLUS
INTRAVENOUS | Status: DC | PRN
  Administered 2018-07-08: 120 mg via INTRAVENOUS

## 2018-07-08 MED ORDER — ROCURONIUM BROMIDE 50 MG/5ML IV SOSY
PREFILLED_SYRINGE | INTRAVENOUS | Status: DC | PRN
  Administered 2018-07-08: 40 mg via INTRAVENOUS
  Administered 2018-07-08: 10 mg via INTRAVENOUS

## 2018-07-08 MED ORDER — ACETAMINOPHEN 325 MG PO TABS
650.0000 mg | ORAL_TABLET | ORAL | Status: DC | PRN
  Administered 2018-07-08: 650 mg via ORAL
  Filled 2018-07-08 (×3): qty 2

## 2018-07-08 MED ORDER — ONDANSETRON HCL 4 MG/2ML IJ SOLN: 4.0000 mg | Freq: Four times a day (QID) | INTRAMUSCULAR | Status: DC | PRN

## 2018-07-08 MED ORDER — LIDOCAINE 2% (20 MG/ML) 5 ML SYRINGE
INTRAMUSCULAR | Status: DC | PRN
  Administered 2018-07-08: 60 mg via INTRAVENOUS

## 2018-07-08 MED ORDER — PROTAMINE SULFATE 10 MG/ML IV SOLN
INTRAVENOUS | Status: DC | PRN
  Administered 2018-07-08: 40 mg via INTRAVENOUS

## 2018-07-08 MED ORDER — HEPARIN SODIUM (PORCINE) 1000 UNIT/ML IJ SOLN
INTRAMUSCULAR | Status: DC | PRN
  Administered 2018-07-08: 1000 [IU] via INTRAVENOUS

## 2018-07-08 MED ORDER — PANTOPRAZOLE SODIUM 40 MG PO TBEC: 40.0000 mg | DELAYED_RELEASE_TABLET | Freq: Every day | ORAL | 0 refills | Status: DC

## 2018-07-08 MED ORDER — FENTANYL CITRATE (PF) 100 MCG/2ML IJ SOLN
INTRAMUSCULAR | Status: DC | PRN
  Administered 2018-07-08: 25 ug via INTRAVENOUS
  Administered 2018-07-08: 100 ug via INTRAVENOUS
  Administered 2018-07-08 (×2): 25 ug via INTRAVENOUS

## 2018-07-08 MED ORDER — SODIUM CHLORIDE 0.9% FLUSH: 3.0000 mL | Freq: Two times a day (BID) | INTRAVENOUS | Status: DC

## 2018-07-08 MED ORDER — METOPROLOL SUCCINATE ER 25 MG PO TB24: 25.0000 mg | ORAL_TABLET | Freq: Every day | ORAL | 1 refills | Status: DC

## 2018-07-08 MED ORDER — HEPARIN SODIUM (PORCINE) 1000 UNIT/ML IJ SOLN
INTRAMUSCULAR | Status: DC | PRN
  Administered 2018-07-08: 3000 [IU] via INTRAVENOUS
  Administered 2018-07-08: 2000 [IU] via INTRAVENOUS
  Administered 2018-07-08: 3000 [IU] via INTRAVENOUS
  Administered 2018-07-08 (×2): 2000 [IU] via INTRAVENOUS

## 2018-07-08 MED ORDER — BUPIVACAINE HCL (PF) 0.25 % IJ SOLN
INTRAMUSCULAR | Status: AC
  Filled 2018-07-08: qty 30

## 2018-07-08 MED ORDER — HEPARIN SODIUM (PORCINE) 1000 UNIT/ML IJ SOLN
INTRAMUSCULAR | Status: DC | PRN
  Administered 2018-07-08: 12000 [IU] via INTRAVENOUS
  Administered 2018-07-08: 1000 [IU] via INTRAVENOUS

## 2018-07-08 MED ORDER — SODIUM CHLORIDE 0.9 % IV SOLN
INTRAVENOUS | Status: DC
  Administered 2018-07-08 (×3): via INTRAVENOUS

## 2018-07-08 MED ORDER — IOPAMIDOL (ISOVUE-370) INJECTION 76%
INTRAVENOUS | Status: DC | PRN
  Administered 2018-07-08: 2 mg

## 2018-07-08 MED ORDER — BUPIVACAINE HCL (PF) 0.25 % IJ SOLN
INTRAMUSCULAR | Status: DC | PRN
  Administered 2018-07-08: 30 mL

## 2018-07-08 SURGICAL SUPPLY — 18 items
BLANKET WARM UNDERBOD FULL ACC (MISCELLANEOUS) ×2 IMPLANT
CATH MAPPNG PENTARAY F 2-6-2MM (CATHETERS) ×1 IMPLANT
CATH NAVISTAR SMARTTOUCH DF (ABLATOR) ×2 IMPLANT
CATH SOUNDSTAR 3D IMAGING (CATHETERS) IMPLANT
CATH SOUNDSTAR ECO REPROCESSED (CATHETERS) ×2 IMPLANT
CATH WEBSTER BI DIR CS D-F CRV (CATHETERS) ×2 IMPLANT
COVER SWIFTLINK CONNECTOR (BAG) ×2 IMPLANT
NEEDLE BAYLIS TRANSSEPTAL 71CM (NEEDLE) ×2 IMPLANT
PACK EP LATEX FREE (CUSTOM PROCEDURE TRAY) ×1
PACK EP LF (CUSTOM PROCEDURE TRAY) ×1 IMPLANT
PAD PRO RADIOLUCENT 2001M-C (PAD) ×2 IMPLANT
PATCH CARTO3 (PAD) ×2 IMPLANT
PENTARAY F 2-6-2MM (CATHETERS) ×2
SHEATH AVANTI 11F 11CM (SHEATH) ×2 IMPLANT
SHEATH PINNACLE 7F 10CM (SHEATH) ×4 IMPLANT
SHEATH PINNACLE 9F 10CM (SHEATH) ×2 IMPLANT
SHEATH SWARTZ TS SL2 63CM 8.5F (SHEATH) ×2 IMPLANT
TUBING SMART ABLATE COOLFLOW (TUBING) ×2 IMPLANT

## 2018-07-08 NOTE — Anesthesia Procedure Notes (Signed)
Procedure Name: Intubation Date/Time: 07/08/2018 7:46 AM Performed by: Neldon Newport, CRNA Pre-anesthesia Checklist: Timeout performed, Patient being monitored, Suction available, Emergency Drugs available and Patient identified Patient Re-evaluated:Patient Re-evaluated prior to induction Oxygen Delivery Method: Circle system utilized Preoxygenation: Pre-oxygenation with 100% oxygen Induction Type: IV induction Ventilation: Mask ventilation without difficulty Laryngoscope Size: McGraph and 3 Grade View: Grade I Tube type: Oral Tube size: 7.0 mm Number of attempts: 1 Placement Confirmation: breath sounds checked- equal and bilateral,  positive ETCO2 and ETT inserted through vocal cords under direct vision Secured at: 20 cm Tube secured with: Tape Dental Injury: Teeth and Oropharynx as per pre-operative assessment

## 2018-07-08 NOTE — H&P (Signed)
PCP: [name], MD Primary Cardiologist: Dr Marlou Porch Primary EP: Dr Rayann Heman  Carolyn Vazquez is a 77 y.o. female who presents today for  electrophysiology study and ablation for afib.  Since last being seen in our clinic, the patient reports doing reasonably well. She is very symptomatic with her afib.  + fatigue and decreased exercise tolerance.  Today, she denies symptoms of palpitations, chest pain, shortness of breath,  lower extremity edema, dizziness, presyncope, or syncope.  The patient is otherwise without complaint today.       Past Medical History:  Diagnosis Date  . Chronic diastolic CHF (congestive heart failure) ([place])   . Hyperlipidemia   . Hypertension   . Hypothyroidism   . Mild dilation of ascending aorta (HCC)   . Persistent atrial fibrillation ([place])    a. dx 06/2017.  . Stroke (cerebrum) ([place])   . 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 ABDOMINAL HYSTERECTOMY  1979  . BUNIONECTOMY Right   . CARDIOVERSION N/A 08/05/2017   Procedure: CARDIOVERSION;  Surgeon: [name], MD;  Location: [facility] ENDOSCOPY;  Service: Cardiovascular;  Laterality: N/A;  . CARDIOVERSION N/A 09/20/2017   Procedure: CARDIOVERSION;  Surgeon: [name], MD;  Location: [place];  Service: Cardiovascular;  Laterality: N/A;  . CHOLECYSTECTOMY    . 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 NASAL SINUS SURGERY      ROS- all systems are reviewed and negatives except as per HPI above  Current Outpatient Medications  Medication Sig Dispense Refill  . acetaminophen (TYLENOL) 325 MG tablet Take 325 mg by mouth daily as needed. Mild pain    . apixaban (ELIQUIS) 5 MG TABS tablet Take 1 tablet (5 mg total) 2 (two) times daily by mouth. 60 tablet 11  . B Complex Vitamins (B-COMPLEX/B-12) TABS Take 1 tablet by mouth daily.     . Calcium Carbonate Antacid  (TUMS PO) Take 1 tablet by mouth daily as needed (as directed).     . CARTIA XT 240 MG 24 hr capsule TAKE 1 CAPSULE BY MOUTH ONCE DAILY 90 capsule 1  . Cholecalciferol (VITAMIN D3) 1000 UNITS CAPS Take 2,000 mg by mouth daily.    [name] co-enzyme Q-10 30 MG capsule Take 30 mg by mouth 3 (three) times a week.    . escitalopram (LEXAPRO) 10 MG tablet Take 1 tablet by mouth daily.    . fexofenadine (ALLEGRA) 180 MG tablet Take 180 mg by mouth daily.    . furosemide (LASIX) 40 MG tablet Take 1 tablet (40 mg total) by mouth daily. 90 tablet 3  . levothyroxine (SYNTHROID) 100 MCG tablet Take 1 tablet (100 mcg total) by mouth daily before breakfast.    . losartan (COZAAR) 25 MG tablet TAKE 1 TABLET BY MOUTH ONCE DAILY 90 tablet 2  . metoprolol succinate (TOPROL XL) 25 MG 24 hr tablet Take 1 tablet (25 mg total) by mouth 2 (two) times daily. 180 tablet 1  . Multiple Vitamins-Minerals (ICAPS AREDS FORMULA PO) Take 1 capsule by mouth 2 (two) times daily.    . potassium chloride (K-DUR) 10 MEQ tablet Take 1 tablet (10 mEq total) by mouth every other day. 15 tablet 6  . rosuvastatin (CRESTOR) 5 MG tablet Take 5 mg by mouth 3 (three) times a week.     No current facility-administered medications for this visit.  Physical Exam: Vitals:   07/08/18 0539  BP: (!) 148/104  Pulse: 83  Temp: 97.6 F (36.4 C)  SpO2: 95%   GEN- The patient is well appearing, alert and oriented x 3 today.   Head- normocephalic, atraumatic Eyes-  Sclera clear, conjunctiva pink Ears- hearing intact Oropharynx- clear Lungs- Clear to ausculation bilaterally, normal work of breathing Heart- Regular rate and rhythm, no murmurs, rubs or gallops, PMI not laterally displaced GI- soft, NT, ND, + BS Extremities- no clubbing, cyanosis, or edema     Wt Readings from Last 3 Encounters:  04/22/18 154 lb (69.9 kg)  12/19/17 150 lb 6.4 oz (68.2 kg)  10/21/17 148 lb (67.1 kg)    Assessment and Plan:  1.  Persistent atrial fibrillation/ atrial flutter The patient has symptomatic, recurrent atrial fibrillation and atrial flutter. she has failed medical therapy with flecainide. Chads2vasc score is 6.  she is anticoagulated with eliquis . Therapeutic strategies for afib including medicine and ablation were discussed in detail with the patient today. Risk, benefits, and alternatives to EP study and radiofrequency ablation for afib were also discussed in detail today. These risks include but are not limited to stroke, bleeding, vascular damage, tamponade, perforation, damage to the esophagus, lungs, and other structures, pulmonary vein stenosis, worsening renal function, and death. The patient understands these risk and wishes to proceed.  She reports compliance with eliquis without interruption.  Cardiac CT result reviewed with her today.  Thompson Grayer MD, [facility] 07/08/2018 7:20 AM

## 2018-07-08 NOTE — Progress Notes (Addendum)
Client attempted to sit on side of bed and c/o dizziness and 10/10 right hip pain with movement of right leg and Dr Rayann Heman notified and no new orders noted; per Dr Rayann Heman sat client up in bed and client states not dizzy now and states hip pain decreased

## 2018-07-08 NOTE — Progress Notes (Signed)
Client states pain 5/10 now and no c/o dizziness now; Dr Rayann Heman notified and okay to d/c home

## 2018-07-08 NOTE — Progress Notes (Signed)
Site area: right groin fv sheaths x3 Site Prior to Removal:  Level 0 Pressure Applied For: 25 minutes Manual:   yes Patient Status During Pull:  stable Post Pull Site:  Level 0 Post Pull Instructions Given:  yes Post Pull Pulses Present: rt dp palpable Dressing Applied:  Gauze and tegaderm Bedrest begins @ 1240 Comments:

## 2018-07-08 NOTE — Anesthesia Postprocedure Evaluation (Signed)
Anesthesia Post Note  Patient: Carolyn Vazquez  Procedure(s) Performed: ATRIAL FIBRILLATION ABLATION (N/A )     Patient location during evaluation: PACU Anesthesia Type: General Level of consciousness: awake and alert Pain management: pain level controlled Vital Signs Assessment: post-procedure vital signs reviewed and stable Respiratory status: spontaneous breathing, nonlabored ventilation and respiratory function stable Cardiovascular status: blood pressure returned to baseline and stable Postop Assessment: no apparent nausea or vomiting Anesthetic complications: no    Last Vitals:  Vitals:   07/08/18 1315 07/08/18 1330  BP: 121/72 121/69  Pulse: 75 74  Resp: 15 (!) 7  Temp:    SpO2: 93% 92%    Last Pain:  Vitals:   07/08/18 1224  TempSrc: Temporal  PainSc:                  Audry Pili

## 2018-07-08 NOTE — Transfer of Care (Signed)
Immediate Anesthesia Transfer of Care Note  Patient: Carolyn Vazquez  Procedure(s) Performed: ATRIAL FIBRILLATION ABLATION (N/A )  Patient Location: Cath Lab  Anesthesia Type:General  Level of Consciousness: awake, alert  and oriented  Airway & Oxygen Therapy: Patient Spontanous Breathing and Patient connected to nasal cannula oxygen  Post-op Assessment: Report given to RN, Post -op Vital signs reviewed and stable and Patient moving all extremities X 4  Post vital signs: Reviewed and stable  Last Vitals:  Vitals Value Taken Time  BP 131/65 07/08/2018 11:40 AM  Temp 35.7 C 07/08/2018 11:43 AM  Pulse 71 07/08/2018 11:45 AM  Resp 18 07/08/2018 11:45 AM  SpO2 96 % 07/08/2018 11:45 AM  Vitals shown include unvalidated device data.  Last Pain:  Vitals:   07/08/18 1143  TempSrc: Temporal  PainSc: 0-No pain      Patients Stated Pain Goal: 3 (36/64/40 3474)  Complications: No apparent anesthesia complications

## 2018-07-08 NOTE — Discharge Instructions (Signed)
No driving for 4 days. No lifting over 5 lbs for 1 week. No sexual activity for 1 week. You may return to work in 1 week. Keep procedure site clean & dry. If you notice increased pain, swelling, bleeding or pus, call/return!  You may shower, but no soaking baths/hot tubs/pools for 1 week.    You have an appointment set up with the [facility].  Multiple studies have shown that being followed by a dedicated atrial fibrillation clinic in addition to the standard care you receive from your other physicians improves health. We believe that enrollment in the atrial fibrillation clinic will allow [place] to better care for you.   The phone number to the [facility] is [phone]. The clinic is staffed Monday through Friday from 8:30am to 5pm.  Parking Directions: The clinic is located in the Heart and Vascular Building connected to [facility]. 1)From [address] turn on to [address] and go to the 3rd entrance  (Heart and Vascular entrance) on the right. 2)Look to the right for Heart &Vascular Parking Garage. 3)A code for the entrance is required please call the clinic to receive this.   4)Take the elevators to the 1st floor. Registration is in the room with the glass walls at the end of the hallway.  If you have any trouble parking or locating the clinic, please dont hesitate to call [phone].   Cardiac Ablation, Care After This sheet gives you information about how to care for yourself after your procedure. Your health care provider may also give you more specific instructions. If you have problems or questions, contact your health care provider. What can I expect after the procedure? After the procedure, it is common to have:  Bruising around your puncture site.  Tenderness around your puncture site.  Skipped heartbeats.  Tiredness (fatigue).  Follow these instructions at home: Puncture site care  Follow instructions from your health care  provider about how to take care of your puncture site. Make sure you: ? Wash your hands with soap and water before you remove your bandage (dressing). If soap and water are not available, use hand sanitizer. ? Dressing may be removed in 24-48 hours. You may then shower.   Check your puncture site every day for signs of infection. Check for: ? Redness, swelling, or pain. ? Fluid or blood. If your puncture site starts to bleed, lie down on your back, apply firm pressure to the area, and contact your health care provider. ? Warmth. ? Pus or a bad smell. Driving  Ask your health care provider when it is safe for you to drive again after the procedure.  Do not drive or use heavy machinery while taking prescription pain medicine.  Do not drive for 24 hours if you were given a medicine to help you relax (sedative) during your procedure. Activity  Avoid activities that take a lot of effort for at least 3 days after your procedure.  Do not lift anything that is heavier than 10 lb (4.5 kg), or the limit that you are told, until your health care provider says that it is safe.  Return to your normal activities as told by your health care provider. Ask your health care provider what activities are safe for you. General instructions  Take over-the-counter and prescription medicines only as told by your health care provider.  Do not use any products that contain nicotine or tobacco, such as cigarettes and e-cigarettes. If you need help quitting, ask  your health care provider.  Do not take baths, swim, or use a hot tub until your health care provider approves.  Do not drink alcohol for 24 hours after your procedure.  Keep all follow-up visits as told by your health care provider. This is important. Contact a health care provider if:  You have redness, mild swelling, or pain around your puncture site.  You have fluid or blood coming from your puncture site that stops after applying firm  pressure to the area.  Your puncture site feels warm to the touch.  You have pus or a bad smell coming from your puncture site.  You have a fever.  You have chest pain or discomfort that spreads to your neck, jaw, or arm.  You are sweating a lot.  You feel nauseous.  You have a fast or irregular heartbeat.  You have shortness of breath.  You are dizzy or light-headed and feel the need to lie down.  You have pain or numbness in the arm or leg closest to your puncture site. Get help right away if:  Your puncture site suddenly swells.  Your puncture site is bleeding and the bleeding does not stop after applying firm pressure to the area. These symptoms may represent a serious problem that is an emergency. Do not wait to see if the symptoms will go away. Get medical help right away. Call your local emergency services (911 in the [place] Do not drive yourself to the hospital. Summary  After the procedure, it is normal to have bruising and tenderness at the puncture site in your groin, neck, or forearm.  Check your puncture site every day for signs of infection.  Get help right away if your puncture site is bleeding and the bleeding does not stop after applying firm pressure to the area. This is a medical emergency. This information is not intended to replace advice given to you by your health care provider. Make sure you discuss any questions you have with your health care provider. Document Released: 02/14/2017 Document Revised: 02/14/2017 Document Reviewed: 02/14/2017 Elsevier Interactive Patient Education  2018 [employer].

## 2018-07-08 NOTE — Progress Notes (Signed)
Client sitting up in recliner and states hip pain better and states less dizzy

## 2018-07-08 NOTE — Progress Notes (Signed)
Report received from Page,RN 

## 2018-07-09 ENCOUNTER — Encounter (HOSPITAL_COMMUNITY): Payer: Self-pay | Admitting: Internal Medicine

## 2018-07-10 ENCOUNTER — Telehealth: Payer: Self-pay | Admitting: Internal Medicine

## 2018-07-10 NOTE — Telephone Encounter (Signed)
Follow Up:    Pt had an Ablation on Tuesday. She is still suffering with a lot of pain in her Esophagus, pain was so bad it woke  Her up this morning at 2:00.She is taking Pantoprazole and she wants to know if she can take Tums along with this please?

## 2018-07-11 NOTE — Telephone Encounter (Signed)
Call returned to Pt.  Pt has had some pain with acid reflux.  She is taking her protonix daily now.  Started day after her ablation.  Advised to continue taking protonix daily and take Tums if she needs to for breakthrough stomach pain.  Pt indicates understanding.  Thanked nurse for call back.

## 2018-07-17 ENCOUNTER — Telehealth (HOSPITAL_COMMUNITY): Payer: Self-pay | Admitting: *Deleted

## 2018-07-17 NOTE — Telephone Encounter (Signed)
Patient called in stating she is having increased watery diarrhea over the last several days. She is feeling weak but otherwise normal. She has a history of intermittent diarrhea but this is an increase in frequency. She was up 3 times during the night with watery diarrhea. Instructed pt she should contact her PCP for further workup of diarrhea, hold protonix a few days see if this helps diarrhea improve and make sure she is taking her medications with food as she does not do this always. Pt in agreement with plan.

## 2018-08-07 ENCOUNTER — Encounter (HOSPITAL_COMMUNITY): Payer: Self-pay | Admitting: Nurse Practitioner

## 2018-08-07 ENCOUNTER — Ambulatory Visit (HOSPITAL_COMMUNITY)
Admission: RE | Admit: 2018-08-07 | Discharge: 2018-08-07 | Disposition: A | Discharge status: Home / Self Care | Payer: Medicare Other | Source: Ambulatory Visit | Attending: Nurse Practitioner | Admitting: Nurse Practitioner

## 2018-08-07 VITALS — BP 110/78 | HR 153 | Ht 61.0 in | Wt 152.6 lb

## 2018-08-07 DIAGNOSIS — E785 Hyperlipidemia, unspecified: Secondary | ICD-10-CM | POA: Insufficient documentation

## 2018-08-07 DIAGNOSIS — I110 Hypertensive heart disease with heart failure: Secondary | ICD-10-CM | POA: Insufficient documentation

## 2018-08-07 DIAGNOSIS — Z880 Allergy status to penicillin: Secondary | ICD-10-CM | POA: Insufficient documentation

## 2018-08-07 DIAGNOSIS — Z833 Family history of diabetes mellitus: Secondary | ICD-10-CM | POA: Insufficient documentation

## 2018-08-07 DIAGNOSIS — I5032 Chronic diastolic (congestive) heart failure: Secondary | ICD-10-CM | POA: Insufficient documentation

## 2018-08-07 DIAGNOSIS — Z9049 Acquired absence of other specified parts of digestive tract: Secondary | ICD-10-CM | POA: Diagnosis not present

## 2018-08-07 DIAGNOSIS — Z8249 Family history of ischemic heart disease and other diseases of the circulatory system: Secondary | ICD-10-CM | POA: Diagnosis not present

## 2018-08-07 DIAGNOSIS — I4819 Other persistent atrial fibrillation: Secondary | ICD-10-CM

## 2018-08-07 DIAGNOSIS — E039 Hypothyroidism, unspecified: Secondary | ICD-10-CM | POA: Diagnosis not present

## 2018-08-07 DIAGNOSIS — Z9889 Other specified postprocedural states: Secondary | ICD-10-CM | POA: Insufficient documentation

## 2018-08-07 DIAGNOSIS — Z8673 Personal history of transient ischemic attack (TIA), and cerebral infarction without residual deficits: Secondary | ICD-10-CM | POA: Insufficient documentation

## 2018-08-07 DIAGNOSIS — Z7901 Long term (current) use of anticoagulants: Secondary | ICD-10-CM | POA: Insufficient documentation

## 2018-08-07 DIAGNOSIS — Z885 Allergy status to narcotic agent status: Secondary | ICD-10-CM | POA: Diagnosis not present

## 2018-08-07 DIAGNOSIS — I481 Persistent atrial fibrillation: Secondary | ICD-10-CM | POA: Diagnosis not present

## 2018-08-07 DIAGNOSIS — Z79899 Other long term (current) drug therapy: Secondary | ICD-10-CM | POA: Insufficient documentation

## 2018-08-07 DIAGNOSIS — Z823 Family history of stroke: Secondary | ICD-10-CM | POA: Insufficient documentation

## 2018-08-07 DIAGNOSIS — Z90710 Acquired absence of both cervix and uterus: Secondary | ICD-10-CM | POA: Diagnosis not present

## 2018-08-07 DIAGNOSIS — Z79890 Hormone replacement therapy (postmenopausal): Secondary | ICD-10-CM | POA: Insufficient documentation

## 2018-08-07 MED ORDER — METOPROLOL SUCCINATE ER 25 MG PO TB24: ORAL_TABLET | ORAL | 1 refills | Status: DC

## 2018-08-07 NOTE — Progress Notes (Signed)
Primary Care Physician: [name], MD Referring Physician: Dr. [name] is a 77 y.o. female with a h/o CVA,persisitent afib that underwent afib ablation one month ago. For the most part she has been feeling well. She feels that she may have been out of rhythm some, but has not been tracking closely. She denies swallowing or groin issues.She is in afib today at 153 bpm.   Today, she denies symptoms of palpitations, chest pain, shortness of breath, orthopnea, PND, lower extremity edema, dizziness, presyncope, syncope, or neurologic sequela. The patient is tolerating medications without difficulties and is otherwise without complaint today.   Past Medical History:  Diagnosis Date  . Chronic diastolic CHF (congestive heart failure) ([place])   . Hyperlipidemia   . Hypertension   . Hypothyroidism   . Mild dilation of ascending aorta (H[facility])   . Persistent atrial fibrillation ([place])    a. dx 06/2017.  . Stroke (cerebrum) ([place])   . 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 Wegener's granulomatosis ([place])    Past Surgical History:  Procedure Laterality Date  . ABDOMINAL HYSTERECTOMY  1979  . ATRIAL FIBRILLATION ABLATION N/A 07/08/2018   Procedure: ATRIAL FIBRILLATION ABLATION;  Surgeon: Thompson Grayer, MD;  Location: [facility];  Service: Cardiovascular;  Laterality: N/A;  . BUNIONECTOMY Right   . CARDIOVERSION N/A 08/05/2017   Procedure: CARDIOVERSION;  Surgeon: [name], MD;  Location: [facility] ENDOSCOPY;  Service: Cardiovascular;  Laterality: N/A;  . CARDIOVERSION N/A 09/20/2017   Procedure: CARDIOVERSION;  Surgeon: [name], MD;  Location: [facility];  Service: Cardiovascular;  Laterality: N/A;  . CHOLECYSTECTOMY    . 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 NASAL SINUS SURGERY      Current Outpatient Medications  Medication Sig Dispense Refill  . acetaminophen (TYLENOL) 500 MG tablet Take 1,000 mg by mouth 2 (two) times daily as  needed for moderate pain or headache.    . B Complex Vitamins (B-COMPLEX/B-12) TABS Take 1 tablet by mouth daily.     . calcium elemental as carbonate (TUMS ULTRA 1000) 400 MG chewable tablet Chew 1,000 mg by mouth daily as needed for heartburn.    [name] CARTIA XT 240 MG 24 hr capsule TAKE 1 CAPSULE BY MOUTH ONCE DAILY 90 capsule 1  . Cholecalciferol 2000 units CAPS Take 2,000 capsules by mouth daily.    . Coenzyme Q10 (COQ-10) 200 MG CAPS Take 200 mg by mouth every Monday, Wednesday, and Friday.    [name] ELIQUIS 5 MG TABS tablet TAKE 1 TABLET BY MOUTH TWICE DAILY (Patient taking differently: Take 5 mg by mouth 2 (two) times daily. ) 180 tablet 1  . escitalopram (LEXAPRO) 10 MG tablet Take 10 mg by mouth daily.     . fexofenadine (ALLEGRA) 180 MG tablet Take 180 mg by mouth daily.    . furosemide (LASIX) 40 MG tablet Take 1 tablet (40 mg total) by mouth daily. 90 tablet 3  . levothyroxine (SYNTHROID) 100 MCG tablet Take 1 tablet (100 mcg total) by mouth daily before breakfast.    . losartan (COZAAR) 25 MG tablet TAKE 1 TABLET BY MOUTH ONCE DAILY 90 tablet 2  . metoprolol succinate (TOPROL XL) 25 MG 24 hr tablet Take 1 tablet in the AM and 1/2 tablet in the PM 180 tablet 1  . Multiple Vitamins-Minerals (ICAPS AREDS FORMULA PO) Take 1 capsule by mouth 2 (two) times daily.    . potassium  chloride (K-DUR) 10 MEQ tablet Take 1 tablet (10 mEq total) by mouth every other day. (Patient taking differently: Take 10 mEq by mouth every Monday, Wednesday, and Friday. ) 15 tablet 6  . Probiotic Product (RESTORA PO) Take 1 tablet by mouth daily.    . ranitidine (ZANTAC) 300 MG tablet Take 300 mg by mouth at bedtime.    . rosuvastatin (CRESTOR) 5 MG tablet Take 5 mg by mouth every Monday, Wednesday, and Friday.     [name] Glycol-Propyl Glycol (SYSTANE OP) Place 2 drops into both eyes as needed (dry eyes).    [name] PRESCRIPTION MEDICATION Place 1 Dose into the left eye every 8 (eight) weeks.    . sodium chloride (OCEAN)  0.65 % SOLN nasal spray Place 1 spray into both nostrils as needed for congestion.     No current facility-administered medications for this encounter.     Allergies  Allergen Reactions  . Codeine Other (See Comments)    Patient isn't sure of reaction. Happens years ago - 63's  . Atorvastatin Other (See Comments)    Depression   . Penicillins Rash    Happened in 1960s Has patient had a PCN reaction causing immediate rash, facial/tongue/throat swelling, SOB or lightheadedness with hypotension: Unknown Has patient had a PCN reaction causing severe rash involving mucus membranes or skin necrosis: Unknown Has patient had a PCN reaction that required hospitalization: Unknown Has patient had a PCN reaction occurring within the last 10 years: No If all of the above answers are "NO", then may proceed with Cephalosporin use.   . Simvastatin Other (See Comments)    JOINT PAIN    Social History   Socioeconomic History  . Marital status: Widowed    Spouse name: Not on file  . Number of children: Not on file  . Years of education: Not on file  . Highest education level: Not on file  Occupational History  . Not on file  Social Needs  . Financial resource strain: Not on file  . Food insecurity:    Worry: Not on file    Inability: Not on file  . Transportation needs:    Medical: Not on file    Non-medical: Not on file  Tobacco Use  . Smoking status: Never Smoker  . Smokeless tobacco: Never Used  Substance and Sexual Activity  . Alcohol use: Yes    Alcohol/week: 0.0 standard drinks    Comment: every day glass of wine  . Drug use: No  . Sexual activity: Not on file  Lifestyle  . Physical activity:    Days per week: Not on file    Minutes per session: Not on file  . Stress: Not on file  Relationships  . Social connections:    Talks on phone: Not on file    Gets together: Not on file    Attends religious service: Not on file    Active member of club or organization: Not on  file    Attends meetings of clubs or organizations: Not on file    Relationship status: Not on file  . Intimate partner violence:    Fear of current or ex partner: Not on file    Emotionally abused: Not on file    Physically abused: Not on file    Forced sexual activity: Not on file  Other Topics Concern  . Not on file  Social History Narrative  . Not on file    Family History  Problem Relation Age of  Onset  . Stroke Mother   . Thyroid disease Mother   . Stroke Father   . Alcoholism Father   . Asthma Sister   . Thyroid disease Sister   . Diabetes Brother   . Thyroid disease Sister   . Asthma Brother   . Heart attack Brother     ROS- All systems are reviewed and negative except as per the HPI above  Physical Exam: Vitals:   08/07/18 1406  BP: 110/78  Pulse: (!) 153  Weight: 69.2 kg  Height: 5\' 1"  (1.549 m)   Wt Readings from Last 3 Encounters:  08/07/18 69.2 kg  07/08/18 68 kg  05/14/18 68 kg    Labs: Lab Results  Component Value Date   NA 139 06/19/2018   K 3.8 06/19/2018   CL 97 06/19/2018   CO2 26 06/19/2018   GLUCOSE 102 (H) 06/19/2018   BUN 10 06/19/2018   CREATININE 0.78 06/19/2018   CALCIUM 9.3 06/19/2018   No results found for: INR Lab Results  Component Value Date   CHOL 192 07/07/2017   HDL 55 07/07/2017   LDLCALC 124 (H) 07/07/2017   TRIG 66 07/07/2017     GEN- The patient is well appearing, alert and oriented x 3 today.   Head- normocephalic, atraumatic Eyes-  Sclera clear, conjunctiva pink Ears- hearing intact Oropharynx- clear Neck- supple, no JVP Lymph- no cervical lymphadenopathy Lungs- Clear to ausculation bilaterally, normal work of breathing Heart- Rapid regular rate and rhythm, no murmurs, rubs or gallops, PMI not laterally displaced GI- soft, NT, ND, + BS Extremities- no clubbing, cyanosis, or edema MS- no significant deformity or atrophy Skin- no rash or lesion Psych- euthymic mood, full affect Neuro- strength and  sensation are intact  EKG- afib at 153 bpm, qrs int 88 ms, qtc 475 ms Epic records reviewed   Assessment and Plan: 1. Persistent afib S/p ablation one month ago  Pt is in rapid afib today  Will add metoprolol succinate 25 mg , 1/2 tab at hs Pt does not believe she has been in afib all along since ablation Continue eliquis 5 mg bid for chadsvasc score of at least 7   Will bring back in one week for EKG, if still out of rhythm, may require cardioversion, have asked her to check HR/BP daily and chart and bring back to office  Butch [name] C. Keen Ewalt, [facility] [address] [place], [place] [address] [phone]

## 2018-08-07 NOTE — Patient Instructions (Signed)
Increase metoprolol to 1 tablet in the morning and 1/2 tablet in the evening. 

## 2018-08-08 ENCOUNTER — Ambulatory Visit (HOSPITAL_COMMUNITY): Payer: Medicare Other | Admitting: Nurse Practitioner

## 2018-08-11 ENCOUNTER — Other Ambulatory Visit: Payer: Self-pay | Admitting: Physician Assistant

## 2018-08-14 ENCOUNTER — Other Ambulatory Visit (HOSPITAL_COMMUNITY): Payer: Self-pay | Admitting: *Deleted

## 2018-08-14 ENCOUNTER — Ambulatory Visit (HOSPITAL_COMMUNITY)
Admission: RE | Admit: 2018-08-14 | Discharge: 2018-08-14 | Disposition: A | Discharge status: Home / Self Care | Payer: Medicare Other | Source: Ambulatory Visit | Attending: Nurse Practitioner | Admitting: Nurse Practitioner

## 2018-08-14 ENCOUNTER — Encounter (HOSPITAL_COMMUNITY): Payer: Self-pay | Admitting: Nurse Practitioner

## 2018-08-14 VITALS — BP 114/76 | HR 84 | Ht 61.0 in | Wt 152.0 lb

## 2018-08-14 DIAGNOSIS — I4892 Unspecified atrial flutter: Secondary | ICD-10-CM | POA: Insufficient documentation

## 2018-08-14 DIAGNOSIS — Z8673 Personal history of transient ischemic attack (TIA), and cerebral infarction without residual deficits: Secondary | ICD-10-CM | POA: Diagnosis not present

## 2018-08-14 DIAGNOSIS — Z9889 Other specified postprocedural states: Secondary | ICD-10-CM | POA: Insufficient documentation

## 2018-08-14 DIAGNOSIS — Z8349 Family history of other endocrine, nutritional and metabolic diseases: Secondary | ICD-10-CM | POA: Insufficient documentation

## 2018-08-14 DIAGNOSIS — E039 Hypothyroidism, unspecified: Secondary | ICD-10-CM | POA: Insufficient documentation

## 2018-08-14 DIAGNOSIS — I110 Hypertensive heart disease with heart failure: Secondary | ICD-10-CM | POA: Insufficient documentation

## 2018-08-14 DIAGNOSIS — Z9049 Acquired absence of other specified parts of digestive tract: Secondary | ICD-10-CM | POA: Insufficient documentation

## 2018-08-14 DIAGNOSIS — Z7901 Long term (current) use of anticoagulants: Secondary | ICD-10-CM | POA: Diagnosis not present

## 2018-08-14 DIAGNOSIS — I5032 Chronic diastolic (congestive) heart failure: Secondary | ICD-10-CM | POA: Diagnosis not present

## 2018-08-14 DIAGNOSIS — Z79890 Hormone replacement therapy (postmenopausal): Secondary | ICD-10-CM | POA: Insufficient documentation

## 2018-08-14 DIAGNOSIS — Z8249 Family history of ischemic heart disease and other diseases of the circulatory system: Secondary | ICD-10-CM | POA: Insufficient documentation

## 2018-08-14 DIAGNOSIS — I481 Persistent atrial fibrillation: Secondary | ICD-10-CM | POA: Insufficient documentation

## 2018-08-14 DIAGNOSIS — Z880 Allergy status to penicillin: Secondary | ICD-10-CM | POA: Insufficient documentation

## 2018-08-14 DIAGNOSIS — Z79899 Other long term (current) drug therapy: Secondary | ICD-10-CM | POA: Diagnosis not present

## 2018-08-14 DIAGNOSIS — Z885 Allergy status to narcotic agent status: Secondary | ICD-10-CM | POA: Diagnosis not present

## 2018-08-14 DIAGNOSIS — I4891 Unspecified atrial fibrillation: Secondary | ICD-10-CM | POA: Diagnosis present

## 2018-08-14 DIAGNOSIS — E785 Hyperlipidemia, unspecified: Secondary | ICD-10-CM | POA: Diagnosis not present

## 2018-08-14 LAB — BASIC METABOLIC PANEL
Anion gap: 10 (ref 5–15)
BUN: 12 mg/dL (ref 8–23)
CO2: 29 mmol/L (ref 22–32)
Calcium: 9.6 mg/dL (ref 8.9–10.3)
Chloride: 100 mmol/L (ref 98–111)
Creatinine, Ser: 0.78 mg/dL (ref 0.44–1.00)
GFR calc Af Amer: >60 mL/min (ref >60)
GFR calc non Af Amer: >60 mL/min (ref >60)
GLUCOSE: 166 mg/dL — AB (ref 70–99)
POTASSIUM: 3.3 mmol/L — AB (ref 3.5–5.1)
Sodium: 139 mmol/L (ref 135–145)

## 2018-08-14 LAB — CBC
HEMATOCRIT: 41 % (ref 36.0–46.0)
Hemoglobin: 13.9 g/dL (ref 12.0–15.0)
MCH: 31.8 pg (ref 26.0–34.0)
MCHC: 33.9 g/dL (ref 30.0–36.0)
MCV: 93.8 fL (ref 78.0–100.0)
Platelets: 258 10*3/uL (ref 150–400)
RBC: 4.37 MIL/uL (ref 3.87–5.11)
RDW: 12.2 % (ref 11.5–15.5)
WBC: 8.4 10*3/uL (ref 4.0–10.5)

## 2018-08-14 NOTE — Progress Notes (Signed)
Primary Care Physician: [name], MD Referring Physician: Dr. Azucena Vazquez is a 77 y.o. female with a h/o CVA,persisitent afib that underwent afib ablation one month ago. For the most part she has been feeling well. She feels that she may have been out of rhythm some, but has not been tracking closely. She denies swallowing or groin issues.She is in afib today at 153 bpm.   F/u in afib clinic 9/26. EKG shows that she is  in atrial flutter but is rate controlled with extra dose of BB. She has not missed any doses of eliquis, will be scheduled for cardioversion.  Today, she denies symptoms of palpitations, chest pain, shortness of breath, orthopnea, PND, lower extremity edema, dizziness, presyncope, syncope, or neurologic sequela. The patient is tolerating medications without difficulties and is otherwise without complaint today.   Past Medical History:  Diagnosis Date  . Chronic diastolic CHF (congestive heart failure) ([place])   . Hyperlipidemia   . Hypertension   . Hypothyroidism   . Mild dilation of ascending aorta (HCC)   . Persistent atrial fibrillation ([place])    a. dx 06/2017.  . Stroke (cerebrum) ([place])   . 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 Wegener's granulomatosis ([place])    Past Surgical History:  Procedure Laterality Date  . ABDOMINAL HYSTERECTOMY  1979  . ATRIAL FIBRILLATION ABLATION N/A 07/08/2018   Procedure: ATRIAL FIBRILLATION ABLATION;  Surgeon: Thompson Grayer, MD;  Location: [place] [facility];  Service: Cardiovascular;  Laterality: N/A;  . BUNIONECTOMY Right   . CARDIOVERSION N/A 08/05/2017   Procedure: CARDIOVERSION;  Surgeon: [name], MD;  Location: [facility] ENDOSCOPY;  Service: Cardiovascular;  Laterality: N/A;  . CARDIOVERSION N/A 09/20/2017   Procedure: CARDIOVERSION;  Surgeon: [name], MD;  Location: [facility];  Service: Cardiovascular;  Laterality: N/A;  . CHOLECYSTECTOMY    . LUNG SURGERY  2000's   both  vasculitis  . NASAL SINUS SURGERY      Current Outpatient Medications  Medication Sig Dispense Refill  . acetaminophen (TYLENOL) 500 MG tablet Take 1,000 mg by mouth 2 (two) times daily as needed for moderate pain or headache.    . B Complex Vitamins (B-COMPLEX/B-12) TABS Take 1 tablet by mouth daily.     . calcium elemental as carbonate (TUMS ULTRA 1000) 400 MG chewable tablet Chew 1,000 mg by mouth daily as needed for heartburn.    [name] CARTIA XT 240 MG 24 hr capsule TAKE 1 CAPSULE BY MOUTH ONCE DAILY 90 capsule 1  . Cholecalciferol 2000 units CAPS Take 2,000 capsules by mouth daily.    . Coenzyme Q10 (COQ-10) 200 MG CAPS Take 200 mg by mouth every Monday, Wednesday, and Friday.    [name] ELIQUIS 5 MG TABS tablet TAKE 1 TABLET BY MOUTH TWICE DAILY (Patient taking differently: Take 5 mg by mouth 2 (two) times daily. ) 180 tablet 1  . escitalopram (LEXAPRO) 10 MG tablet Take 10 mg by mouth daily.     . fexofenadine (ALLEGRA) 180 MG tablet Take 180 mg by mouth daily.    . furosemide (LASIX) 40 MG tablet Take 1 tablet (40 mg total) by mouth daily. 90 tablet 3  . levothyroxine (SYNTHROID) 100 MCG tablet Take 1 tablet (100 mcg total) by mouth daily before breakfast.    . losartan (COZAAR) 25 MG tablet TAKE 1 TABLET BY MOUTH ONCE DAILY 90 tablet 2  . metoprolol succinate (TOPROL-XL) 25 MG 24 hr tablet Take  1 tablet in the AM and 1/2 tablet in the PM 135 tablet 1  . Multiple Vitamins-Minerals (ICAPS AREDS FORMULA PO) Take 1 capsule by mouth 2 (two) times daily.    [name] Glycol-Propyl Glycol (SYSTANE OP) Place 2 drops into both eyes as needed (dry eyes).    . potassium chloride (K-DUR) 10 MEQ tablet Take 1 tablet (10 mEq total) by mouth every other day. (Patient taking differently: Take 10 mEq by mouth every Monday, Wednesday, and Friday. ) 15 tablet 6  . PRESCRIPTION MEDICATION Place 1 Dose into the left eye every 8 (eight) weeks.    . Probiotic Product (RESTORA PO) Take 1 tablet by mouth daily.    .  ranitidine (ZANTAC) 300 MG tablet Take 300 mg by mouth at bedtime.    . rosuvastatin (CRESTOR) 5 MG tablet Take 5 mg by mouth every Monday, Wednesday, and Friday.     . sodium chloride (OCEAN) 0.65 % SOLN nasal spray Place 1 spray into both nostrils as needed for congestion.     No current facility-administered medications for this encounter.     Allergies  Allergen Reactions  . Codeine Other (See Comments)    Patient isn't sure of reaction. Happens years ago - 70's  . Atorvastatin Other (See Comments)    Depression   . Penicillins Rash    Happened in 1960s Has patient had a PCN reaction causing immediate rash, facial/tongue/throat swelling, SOB or lightheadedness with hypotension: Unknown Has patient had a PCN reaction causing severe rash involving mucus membranes or skin necrosis: Unknown Has patient had a PCN reaction that required hospitalization: Unknown Has patient had a PCN reaction occurring within the last 10 years: No If all of the above answers are "NO", then may proceed with Cephalosporin use.   . Simvastatin Other (See Comments)    JOINT PAIN    Social History   Socioeconomic History  . Marital status: Widowed    Spouse name: Not on file  . Number of children: Not on file  . Years of education: Not on file  . Highest education level: Not on file  Occupational History  . Not on file  Social Needs  . Financial resource strain: Not on file  . Food insecurity:    Worry: Not on file    Inability: Not on file  . Transportation needs:    Medical: Not on file    Non-medical: Not on file  Tobacco Use  . Smoking status: Never Smoker  . Smokeless tobacco: Never Used  Substance and Sexual Activity  . Alcohol use: Yes    Alcohol/week: 0.0 standard drinks    Comment: every day glass of wine  . Drug use: No  . Sexual activity: Not on file  Lifestyle  . Physical activity:    Days per week: Not on file    Minutes per session: Not on file  . Stress: Not on file    Relationships  . Social connections:    Talks on phone: Not on file    Gets together: Not on file    Attends religious service: Not on file    Active member of club or organization: Not on file    Attends meetings of clubs or organizations: Not on file    Relationship status: Not on file  . Intimate partner violence:    Fear of current or ex partner: Not on file    Emotionally abused: Not on file    Physically abused: Not on file  Forced sexual activity: Not on file  Other Topics Concern  . Not on file  Social History Narrative  . Not on file    Family History  Problem Relation Age of Onset  . Stroke Mother   . Thyroid disease Mother   . Stroke Father   . Alcoholism Father   . Asthma Sister   . Thyroid disease Sister   . Diabetes Brother   . Thyroid disease Sister   . Asthma Brother   . Heart attack Brother     ROS- All systems are reviewed and negative except as per the HPI above  Physical Exam: Vitals:   08/14/18 1439  Weight: 68.9 kg  Height: 5\' 1"  (1.549 m)   Wt Readings from Last 3 Encounters:  08/14/18 68.9 kg  08/07/18 69.2 kg  07/08/18 68 kg    Labs: Lab Results  Component Value Date   NA 139 06/19/2018   K 3.8 06/19/2018   CL 97 06/19/2018   CO2 26 06/19/2018   GLUCOSE 102 (H) 06/19/2018   BUN 10 06/19/2018   CREATININE 0.78 06/19/2018   CALCIUM 9.3 06/19/2018   No results found for: INR Lab Results  Component Value Date   CHOL 192 07/07/2017   HDL 55 07/07/2017   LDLCALC 124 (H) 07/07/2017   TRIG 66 07/07/2017     GEN- The patient is well appearing, alert and oriented x 3 today.   Head- normocephalic, atraumatic Eyes-  Sclera clear, conjunctiva pink Ears- hearing intact Oropharynx- clear Neck- supple, no JVP Lymph- no cervical lymphadenopathy Lungs- Clear to ausculation bilaterally, normal work of breathing Heart- regular rate and rhythm, no murmurs, rubs or gallops, PMI not laterally displaced GI- soft, NT, ND, +  BS Extremities- no clubbing, cyanosis, or edema MS- no significant deformity or atrophy Skin- no rash or lesion Psych- euthymic mood, full affect Neuro- strength and sensation are intact  EKG- atrial flutter at 84 bpm, pr int 158 ms, qrs 78 ms, qtc 430 ms Epic records reviewed   Assessment and Plan: 1. Persistent afib/flutter S/p ablation one month ago  Pt was in rapid afib last week, controlled atrial flutter today Continue metoprolol succinate 25 mg, 1/2 tab at hs Will plan on cardioversion Continue eliquis 5 mg bid for chadsvasc score of at least 7, no missed doses for at least 3 weeks   f/u here in one week after cardioversion Dr Rayann Heman as scheduled in November  Carolyn Vazquez, [facility] [address] [place], [place] [address] [phone]

## 2018-08-14 NOTE — H&P (View-Only) (Signed)
Primary Care Physician: [name], MD Referring Physician: Dr. [name] is a 77 y.o. female with a h/o CVA,persisitent afib that underwent afib ablation one month ago. For the most part she has been feeling well. She feels that she may have been out of rhythm some, but has not been tracking closely. She denies swallowing or groin issues.She is in afib today at 153 bpm.   F/u in afib clinic 9/26. EKG shows that she is  in atrial flutter but is rate controlled with extra dose of BB. She has not missed any doses of eliquis, will be scheduled for cardioversion.  Today, she denies symptoms of palpitations, chest pain, shortness of breath, orthopnea, PND, lower extremity edema, dizziness, presyncope, syncope, or neurologic sequela. The patient is tolerating medications without difficulties and is otherwise without complaint today.   Past Medical History:  Diagnosis Date  . Chronic diastolic CHF (congestive heart failure) ([place])   . Hyperlipidemia   . Hypertension   . Hypothyroidism   . Mild dilation of ascending aorta (HCC)   . Persistent atrial fibrillation ([place])    a. dx 06/2017.  . Stroke (cerebrum) ([place])   . 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 Wegener's granulomatosis ([place])    Past Surgical History:  Procedure Laterality Date  . ABDOMINAL HYSTERECTOMY  1979  . ATRIAL FIBRILLATION ABLATION N/A 07/08/2018   Procedure: ATRIAL FIBRILLATION ABLATION;  Surgeon: Thompson Grayer, MD;  Location: [place] [facility];  Service: Cardiovascular;  Laterality: N/A;  . BUNIONECTOMY Right   . CARDIOVERSION N/A 08/05/2017   Procedure: CARDIOVERSION;  Surgeon: [name], MD;  Location: [facility] ENDOSCOPY;  Service: Cardiovascular;  Laterality: N/A;  . CARDIOVERSION N/A 09/20/2017   Procedure: CARDIOVERSION;  Surgeon: [name], MD;  Location: [place];  Service: Cardiovascular;  Laterality: N/A;  . CHOLECYSTECTOMY    . LUNG SURGERY  2000's   both  vasculitis  . NASAL SINUS SURGERY      Current Outpatient Medications  Medication Sig Dispense Refill  . acetaminophen (TYLENOL) 500 MG tablet Take 1,000 mg by mouth 2 (two) times daily as needed for moderate pain or headache.    . B Complex Vitamins (B-COMPLEX/B-12) TABS Take 1 tablet by mouth daily.     . calcium elemental as carbonate (TUMS ULTRA 1000) 400 MG chewable tablet Chew 1,000 mg by mouth daily as needed for heartburn.    [name] CARTIA XT 240 MG 24 hr capsule TAKE 1 CAPSULE BY MOUTH ONCE DAILY 90 capsule 1  . Cholecalciferol 2000 units CAPS Take 2,000 capsules by mouth daily.    . Coenzyme Q10 (COQ-10) 200 MG CAPS Take 200 mg by mouth every Monday, Wednesday, and Friday.    [name] ELIQUIS 5 MG TABS tablet TAKE 1 TABLET BY MOUTH TWICE DAILY (Patient taking differently: Take 5 mg by mouth 2 (two) times daily. ) 180 tablet 1  . escitalopram (LEXAPRO) 10 MG tablet Take 10 mg by mouth daily.     . fexofenadine (ALLEGRA) 180 MG tablet Take 180 mg by mouth daily.    . furosemide (LASIX) 40 MG tablet Take 1 tablet (40 mg total) by mouth daily. 90 tablet 3  . levothyroxine (SYNTHROID) 100 MCG tablet Take 1 tablet (100 mcg total) by mouth daily before breakfast.    . losartan (COZAAR) 25 MG tablet TAKE 1 TABLET BY MOUTH ONCE DAILY 90 tablet 2  . metoprolol succinate (TOPROL-XL) 25 MG 24 hr tablet Take  1 tablet in the AM and 1/2 tablet in the PM 135 tablet 1  . Multiple Vitamins-Minerals (ICAPS AREDS FORMULA PO) Take 1 capsule by mouth 2 (two) times daily.    [name] Glycol-Propyl Glycol (SYSTANE OP) Place 2 drops into both eyes as needed (dry eyes).    . potassium chloride (K-DUR) 10 MEQ tablet Take 1 tablet (10 mEq total) by mouth every other day. (Patient taking differently: Take 10 mEq by mouth every Monday, Wednesday, and Friday. ) 15 tablet 6  . PRESCRIPTION MEDICATION Place 1 Dose into the left eye every 8 (eight) weeks.    . Probiotic Product (RESTORA PO) Take 1 tablet by mouth daily.    .  ranitidine (ZANTAC) 300 MG tablet Take 300 mg by mouth at bedtime.    . rosuvastatin (CRESTOR) 5 MG tablet Take 5 mg by mouth every Monday, Wednesday, and Friday.     . sodium chloride (OCEAN) 0.65 % SOLN nasal spray Place 1 spray into both nostrils as needed for congestion.     No current facility-administered medications for this encounter.     Allergies  Allergen Reactions  . Codeine Other (See Comments)    Patient isn't sure of reaction. Happens years ago - 20's  . Atorvastatin Other (See Comments)    Depression   . Penicillins Rash    Happened in 1960s Has patient had a PCN reaction causing immediate rash, facial/tongue/throat swelling, SOB or lightheadedness with hypotension: Unknown Has patient had a PCN reaction causing severe rash involving mucus membranes or skin necrosis: Unknown Has patient had a PCN reaction that required hospitalization: Unknown Has patient had a PCN reaction occurring within the last 10 years: No If all of the above answers are "NO", then may proceed with Cephalosporin use.   . Simvastatin Other (See Comments)    JOINT PAIN    Social History   Socioeconomic History  . Marital status: Widowed    Spouse name: Not on file  . Number of children: Not on file  . Years of education: Not on file  . Highest education level: Not on file  Occupational History  . Not on file  Social Needs  . Financial resource strain: Not on file  . Food insecurity:    Worry: Not on file    Inability: Not on file  . Transportation needs:    Medical: Not on file    Non-medical: Not on file  Tobacco Use  . Smoking status: Never Smoker  . Smokeless tobacco: Never Used  Substance and Sexual Activity  . Alcohol use: Yes    Alcohol/week: 0.0 standard drinks    Comment: every day glass of wine  . Drug use: No  . Sexual activity: Not on file  Lifestyle  . Physical activity:    Days per week: Not on file    Minutes per session: Not on file  . Stress: Not on file    Relationships  . Social connections:    Talks on phone: Not on file    Gets together: Not on file    Attends religious service: Not on file    Active member of club or organization: Not on file    Attends meetings of clubs or organizations: Not on file    Relationship status: Not on file  . Intimate partner violence:    Fear of current or ex partner: Not on file    Emotionally abused: Not on file    Physically abused: Not on file  Forced sexual activity: Not on file  Other Topics Concern  . Not on file  Social History Narrative  . Not on file    Family History  Problem Relation Age of Onset  . Stroke Mother   . Thyroid disease Mother   . Stroke Father   . Alcoholism Father   . Asthma Sister   . Thyroid disease Sister   . Diabetes Brother   . Thyroid disease Sister   . Asthma Brother   . Heart attack Brother     ROS- All systems are reviewed and negative except as per the HPI above  Physical Exam: Vitals:   08/14/18 1439  Weight: 68.9 kg  Height: 5\' 1"  (1.549 m)   Wt Readings from Last 3 Encounters:  08/14/18 68.9 kg  08/07/18 69.2 kg  07/08/18 68 kg    Labs: Lab Results  Component Value Date   NA 139 06/19/2018   K 3.8 06/19/2018   CL 97 06/19/2018   CO2 26 06/19/2018   GLUCOSE 102 (H) 06/19/2018   BUN 10 06/19/2018   CREATININE 0.78 06/19/2018   CALCIUM 9.3 06/19/2018   No results found for: INR Lab Results  Component Value Date   CHOL 192 07/07/2017   HDL 55 07/07/2017   LDLCALC 124 (H) 07/07/2017   TRIG 66 07/07/2017     GEN- The patient is well appearing, alert and oriented x 3 today.   Head- normocephalic, atraumatic Eyes-  Sclera clear, conjunctiva pink Ears- hearing intact Oropharynx- clear Neck- supple, no JVP Lymph- no cervical lymphadenopathy Lungs- Clear to ausculation bilaterally, normal work of breathing Heart- regular rate and rhythm, no murmurs, rubs or gallops, PMI not laterally displaced GI- soft, NT, ND, +  BS Extremities- no clubbing, cyanosis, or edema MS- no significant deformity or atrophy Skin- no rash or lesion Psych- euthymic mood, full affect Neuro- strength and sensation are intact  EKG- atrial flutter at 84 bpm, pr int 158 ms, qrs 78 ms, qtc 430 ms Epic records reviewed   Assessment and Plan: 1. Persistent afib/flutter S/p ablation one month ago  Pt was in rapid afib last week, controlled atrial flutter today Continue metoprolol succinate 25 mg, 1/2 tab at hs Will plan on cardioversion Continue eliquis 5 mg bid for chadsvasc score of at least 7, no missed doses for at least 3 weeks   f/u here in one week after cardioversion Dr Rayann Heman as scheduled in November  Ayron Fillinger C. Karlissa Aron, [facility] [address] [place], [place] [address] [phone]

## 2018-08-15 ENCOUNTER — Other Ambulatory Visit (HOSPITAL_COMMUNITY): Payer: Self-pay | Admitting: *Deleted

## 2018-08-15 MED ORDER — POTASSIUM CHLORIDE ER 10 MEQ PO TBCR: 10.0000 meq | EXTENDED_RELEASE_TABLET | Freq: Every day | ORAL | 6 refills | Status: DC

## 2018-08-21 ENCOUNTER — Ambulatory Visit (HOSPITAL_COMMUNITY): Payer: Medicare Other | Admitting: Anesthesiology

## 2018-08-21 ENCOUNTER — Encounter (HOSPITAL_COMMUNITY): Payer: Self-pay | Admitting: *Deleted

## 2018-08-21 ENCOUNTER — Encounter (HOSPITAL_COMMUNITY)
Admission: RE | Disposition: A | Discharge status: Home / Self Care | Payer: Self-pay | Source: Ambulatory Visit | Attending: Cardiovascular Disease

## 2018-08-21 ENCOUNTER — Ambulatory Visit (HOSPITAL_COMMUNITY)
Admission: RE | Admit: 2018-08-21 | Discharge: 2018-08-21 | Disposition: A | Discharge status: Home / Self Care | Payer: Medicare Other | Source: Ambulatory Visit | Attending: Cardiovascular Disease | Admitting: Cardiovascular Disease

## 2018-08-21 DIAGNOSIS — Z7901 Long term (current) use of anticoagulants: Secondary | ICD-10-CM | POA: Diagnosis not present

## 2018-08-21 DIAGNOSIS — Z880 Allergy status to penicillin: Secondary | ICD-10-CM | POA: Insufficient documentation

## 2018-08-21 DIAGNOSIS — I4892 Unspecified atrial flutter: Secondary | ICD-10-CM | POA: Diagnosis not present

## 2018-08-21 DIAGNOSIS — I4819 Other persistent atrial fibrillation: Secondary | ICD-10-CM

## 2018-08-21 DIAGNOSIS — E785 Hyperlipidemia, unspecified: Secondary | ICD-10-CM | POA: Diagnosis not present

## 2018-08-21 DIAGNOSIS — I110 Hypertensive heart disease with heart failure: Secondary | ICD-10-CM | POA: Diagnosis not present

## 2018-08-21 DIAGNOSIS — Z8673 Personal history of transient ischemic attack (TIA), and cerebral infarction without residual deficits: Secondary | ICD-10-CM | POA: Diagnosis not present

## 2018-08-21 DIAGNOSIS — E039 Hypothyroidism, unspecified: Secondary | ICD-10-CM | POA: Diagnosis not present

## 2018-08-21 DIAGNOSIS — I5032 Chronic diastolic (congestive) heart failure: Secondary | ICD-10-CM | POA: Diagnosis not present

## 2018-08-21 HISTORY — PX: CARDIOVERSION: SHX1299

## 2018-08-21 LAB — POCT I-STAT 4, (NA,K, GLUC, HGB,HCT)
Glucose, Bld: 110 mg/dL — ABNORMAL HIGH (ref 70–99)
HCT: 39 % (ref 36.0–46.0)
Hemoglobin: 13.3 g/dL (ref 12.0–15.0)
Potassium: 3.5 mmol/L (ref 3.5–5.1)
Sodium: 141 mmol/L (ref 135–145)

## 2018-08-21 SURGERY — CARDIOVERSION
Anesthesia: General

## 2018-08-21 MED ORDER — PROPOFOL 10 MG/ML IV BOLUS
INTRAVENOUS | Status: DC | PRN
  Administered 2018-08-21: 60 mg via INTRAVENOUS

## 2018-08-21 MED ORDER — LIDOCAINE 2% (20 MG/ML) 5 ML SYRINGE
INTRAMUSCULAR | Status: DC | PRN
  Administered 2018-08-21: 60 mg via INTRAVENOUS

## 2018-08-21 MED ORDER — SODIUM CHLORIDE 0.9 % IV SOLN
INTRAVENOUS | Status: DC | PRN
  Administered 2018-08-21: 13:00:00 via INTRAVENOUS

## 2018-08-21 NOTE — CV Procedure (Signed)
    Cardioversion Note  Carolyn Vazquez [id number] 16-Apr-1941  Procedure: DC Cardioversion Indications: atrial fib   Procedure Details Consent: Obtained Time Out: Verified patient identification, verified procedure, site/side was marked, verified correct patient position, special equipment/implants available, Radiology Safety Procedures followed,  medications/allergies/relevent history reviewed, required imaging and test results available.  Performed  The patient has been on adequate anticoagulation.  The patient received IV Lidocaine 60 mg followed by Propofol 60 mg IV  for sedation.  Synchronous cardioversion was performed at 120  joules.  The cardioversion was  Successful,     Complications: No apparent complications Patient did tolerate procedure well.   Thayer Headings, Brooke Bonito., MD, [facility] 08/21/2018, 1:47 PM

## 2018-08-21 NOTE — Anesthesia Preprocedure Evaluation (Signed)
Anesthesia Evaluation  Patient identified by MRN, date of birth, ID band Patient awake    Reviewed: Allergy & Precautions, H&P , NPO status , Patient's Chart, lab work & pertinent test results  Airway Mallampati: II  TM Distance: >3 FB Neck ROM: Full    Dental no notable dental hx. (+) Teeth Intact   Pulmonary    Pulmonary exam normal breath sounds clear to auscultation       Cardiovascular hypertension, Pt. on medications +CHF  Normal cardiovascular exam+ dysrhythmias Atrial Fibrillation  Rhythm:Regular Rate:Normal     Neuro/Psych TIA   GI/Hepatic   Endo/Other  Hypothyroidism   Renal/GU Renal disease     Musculoskeletal   Abdominal   Peds  Hematology   Anesthesia Other Findings   Reproductive/Obstetrics                             Anesthesia Physical Anesthesia Plan  ASA: III  Anesthesia Plan: General   Post-op Pain Management:    Induction: Intravenous  PONV Risk Score and Plan: 3 and Treatment may vary due to age or medical condition, Ondansetron and Dexamethasone  Airway Management Planned: Nasal Cannula and Natural Airway  Additional Equipment:   Intra-op Plan:   Post-operative Plan:   Informed Consent: I have reviewed the patients History and Physical, chart, labs and discussed the procedure including the risks, benefits and alternatives for the proposed anesthesia with the patient or authorized representative who has indicated his/her understanding and acceptance.   Dental advisory given  Plan Discussed with: CRNA  Anesthesia Plan Comments:         Anesthesia Quick Evaluation

## 2018-08-21 NOTE — Discharge Instructions (Signed)
Electrical Cardioversion, Care After °This sheet gives you information about how to care for yourself after your procedure. Your health care provider may also give you more specific instructions. If you have problems or questions, contact your health care provider. °What can I expect after the procedure? °After the procedure, it is common to have: °· Some redness on the skin where the shocks were given. ° °Follow these instructions at home: °· Do not drive for 24 hours if you were given a medicine to help you relax (sedative). °· Take over-the-counter and prescription medicines only as told by your health care provider. °· Ask your health care provider how to check your pulse. Check it often. °· Rest for 48 hours after the procedure or as told by your health care provider. °· Avoid or limit your caffeine use as told by your health care provider. °Contact a health care provider if: °· You feel like your heart is beating too quickly or your pulse is not regular. °· You have a serious muscle cramp that does not go away. °Get help right away if: °· You have discomfort in your chest. °· You are dizzy or you feel faint. °· You have trouble breathing or you are short of breath. °· Your speech is slurred. °· You have trouble moving an arm or leg on one side of your body. °· Your fingers or toes turn cold or blue. °This information is not intended to replace advice given to you by your health care provider. Make sure you discuss any questions you have with your health care provider. °Document Released: 08/26/2013 Document Revised: 06/08/2016 Document Reviewed: 05/11/2016 °Elsevier Interactive Patient Education © 2018 [employer] Inc[employer] ° °

## 2018-08-21 NOTE — Anesthesia Procedure Notes (Signed)
Procedure Name: MAC Date/Time: 08/21/2018 1:25 PM Performed by: Doran Clay, CRNA Pre-anesthesia Checklist: Patient identified, Emergency Drugs available, Suction available, Patient being monitored and Timeout performed Patient Re-evaluated:Patient Re-evaluated prior to induction Oxygen Delivery Method: Nasal cannula Induction Type: IV induction Placement Confirmation: positive ETCO2 Dental Injury: Teeth and Oropharynx as per pre-operative assessment

## 2018-08-21 NOTE — Anesthesia Postprocedure Evaluation (Signed)
Anesthesia Post Note  Patient: Carolyn Vazquez  Procedure(s) Performed: CARDIOVERSION (N/A )     Patient location during evaluation: Endoscopy Anesthesia Type: General Level of consciousness: awake and alert Pain management: pain level controlled Vital Signs Assessment: post-procedure vital signs reviewed and stable Respiratory status: spontaneous breathing, nonlabored ventilation, respiratory function stable and patient connected to nasal cannula oxygen Cardiovascular status: blood pressure returned to baseline and stable Postop Assessment: no apparent nausea or vomiting Anesthetic complications: no    Last Vitals:  Vitals:   08/21/18 1339 08/21/18 1341  BP:  111/67  Pulse: 77 73  Resp: (!) 22 (!) 23  Temp:    SpO2: 100% 100%    Last Pain:  Vitals:   08/21/18 1341  TempSrc: Oral  PainSc: 0-No pain                 Barnet Glasgow

## 2018-08-21 NOTE — Interval H&P Note (Signed)
History and Physical Interval Note:  08/21/2018 1:05 PM  Carolyn Vazquez  has presented today for surgery, with the diagnosis of afib  The various methods of treatment have been discussed with the patient and family. After consideration of risks, benefits and other options for treatment, the patient has consented to  Procedure(s): CARDIOVERSION (N/A) as a surgical intervention .  The patient's history has been reviewed, patient examined, no change in status, stable for surgery.  I have reviewed the patient's chart and labs.  Questions were answered to the patient's satisfaction.     Mertie Moores

## 2018-08-21 NOTE — Transfer of Care (Signed)
Immediate Anesthesia Transfer of Care Note  Patient: Carolyn Vazquez  Procedure(s) Performed: CARDIOVERSION (N/A )  Patient Location: Endo  Anesthesia Type:MAC  Level of Consciousness: awake, alert  and oriented  Airway & Oxygen Therapy: Patient Spontanous Breathing and Patient connected to nasal cannula oxygen  Post-op Assessment: Report given to RN and Post -op Vital signs reviewed and stable  Post vital signs: Reviewed and stable  Last Vitals:  Vitals Value Taken Time  BP    Temp    Pulse    Resp    SpO2      Last Pain:  Vitals:   08/21/18 1227  TempSrc: Oral  PainSc: 0-No pain         Complications: No apparent anesthesia complications

## 2018-08-28 ENCOUNTER — Other Ambulatory Visit (HOSPITAL_COMMUNITY): Payer: Self-pay | Admitting: Nurse Practitioner

## 2018-09-04 ENCOUNTER — Ambulatory Visit (HOSPITAL_COMMUNITY)
Admission: RE | Admit: 2018-09-04 | Discharge: 2018-09-04 | Disposition: A | Discharge status: Home / Self Care | Payer: Medicare Other | Source: Ambulatory Visit | Attending: Nurse Practitioner | Admitting: Nurse Practitioner

## 2018-09-04 ENCOUNTER — Encounter (HOSPITAL_COMMUNITY): Payer: Self-pay | Admitting: Nurse Practitioner

## 2018-09-04 VITALS — BP 122/86 | HR 132 | Ht 61.0 in | Wt 154.0 lb

## 2018-09-04 DIAGNOSIS — Z823 Family history of stroke: Secondary | ICD-10-CM | POA: Insufficient documentation

## 2018-09-04 DIAGNOSIS — Z880 Allergy status to penicillin: Secondary | ICD-10-CM | POA: Insufficient documentation

## 2018-09-04 DIAGNOSIS — Z833 Family history of diabetes mellitus: Secondary | ICD-10-CM | POA: Insufficient documentation

## 2018-09-04 DIAGNOSIS — Z8673 Personal history of transient ischemic attack (TIA), and cerebral infarction without residual deficits: Secondary | ICD-10-CM | POA: Diagnosis not present

## 2018-09-04 DIAGNOSIS — Z79890 Hormone replacement therapy (postmenopausal): Secondary | ICD-10-CM | POA: Insufficient documentation

## 2018-09-04 DIAGNOSIS — I5032 Chronic diastolic (congestive) heart failure: Secondary | ICD-10-CM | POA: Insufficient documentation

## 2018-09-04 DIAGNOSIS — Z79899 Other long term (current) drug therapy: Secondary | ICD-10-CM | POA: Insufficient documentation

## 2018-09-04 DIAGNOSIS — Z7901 Long term (current) use of anticoagulants: Secondary | ICD-10-CM | POA: Insufficient documentation

## 2018-09-04 DIAGNOSIS — I4892 Unspecified atrial flutter: Secondary | ICD-10-CM | POA: Diagnosis not present

## 2018-09-04 DIAGNOSIS — I4819 Other persistent atrial fibrillation: Secondary | ICD-10-CM | POA: Insufficient documentation

## 2018-09-04 DIAGNOSIS — E785 Hyperlipidemia, unspecified: Secondary | ICD-10-CM | POA: Diagnosis not present

## 2018-09-04 DIAGNOSIS — Z90710 Acquired absence of both cervix and uterus: Secondary | ICD-10-CM | POA: Diagnosis not present

## 2018-09-04 DIAGNOSIS — I110 Hypertensive heart disease with heart failure: Secondary | ICD-10-CM | POA: Diagnosis not present

## 2018-09-04 DIAGNOSIS — Z885 Allergy status to narcotic agent status: Secondary | ICD-10-CM | POA: Diagnosis not present

## 2018-09-04 DIAGNOSIS — Z9049 Acquired absence of other specified parts of digestive tract: Secondary | ICD-10-CM | POA: Insufficient documentation

## 2018-09-04 DIAGNOSIS — Z9889 Other specified postprocedural states: Secondary | ICD-10-CM | POA: Insufficient documentation

## 2018-09-04 MED ORDER — METOPROLOL SUCCINATE ER 25 MG PO TB24: 25.0000 mg | ORAL_TABLET | Freq: Two times a day (BID) | ORAL | 1 refills | Status: DC

## 2018-09-04 NOTE — Patient Instructions (Signed)
Increase metoprolol to 25mg  twice a day    Call on Monday with heart rates

## 2018-09-04 NOTE — Progress Notes (Signed)
Primary Care Physician: [name], MD Referring Physician: Dr. [name] is a 77 y.o. female with a h/o CVA,persisitent afib that underwent afib ablation 07/08/18.For the most part she has been feeling well. She feels that she may have been out of rhythm some, but has not been tracking closely. She denies swallowing or groin issues.She is in afib today at 153 bpm.   F/u in afib clinic 9/26. EKG shows that she is  in atrial flutter but is rate controlled with extra dose of BB. She has not missed any doses of eliquis, will be scheduled for cardioversion.  F/u 10/17. She did have a successful cardioversion but went out of rhythm just a few days later. She has used flecainide in the past which was not successful. She does not want to try any further antiarrythmic's today. She is agreeable to rate control at this time. Feels tired.  Today, she denies symptoms of palpitations, chest pain, shortness of breath, orthopnea, PND, lower extremity edema, dizziness, presyncope, syncope, or neurologic sequela. The patient is tolerating medications without difficulties and is otherwise without complaint today.   Past Medical History:  Diagnosis Date  . Chronic diastolic CHF (congestive heart failure) ([place])   . Hyperlipidemia   . Hypertension   . Hypothyroidism   . Mild dilation of ascending aorta (H[facility])   . Persistent atrial fibrillation    a. dx 06/2017.  . Stroke (cerebrum) ([place])   . 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 Wegener's granulomatosis ([place])    Past Surgical History:  Procedure Laterality Date  . ABDOMINAL HYSTERECTOMY  1979  . ATRIAL FIBRILLATION ABLATION N/A 07/08/2018   Procedure: ATRIAL FIBRILLATION ABLATION;  Surgeon: Thompson Grayer, MD;  Location: [place] [facility];  Service: Cardiovascular;  Laterality: N/A;  . BUNIONECTOMY Right   . CARDIOVERSION N/A 08/05/2017   Procedure: CARDIOVERSION;  Surgeon: [name], MD;  Location: [facility] ENDOSCOPY;   Service: Cardiovascular;  Laterality: N/A;  . CARDIOVERSION N/A 09/20/2017   Procedure: CARDIOVERSION;  Surgeon: [name], MD;  Location: [facility] ENDOSCOPY;  Service: Cardiovascular;  Laterality: N/A;  . CARDIOVERSION N/A 08/21/2018   Procedure: CARDIOVERSION;  Surgeon: Thayer Headings, MD;  Location: [place];  Service: Cardiovascular;  Laterality: N/A;  . CHOLECYSTECTOMY    . 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 NASAL SINUS SURGERY      Current Outpatient Medications  Medication Sig Dispense Refill  . acetaminophen (TYLENOL) 500 MG tablet Take 1,000 mg by mouth 2 (two) times daily as needed for moderate pain or headache.    . B Complex Vitamins (B-COMPLEX/B-12) TABS Take 1 tablet by mouth daily.     . calcium elemental as carbonate (TUMS ULTRA 1000) 400 MG chewable tablet Chew 1,000 mg by mouth daily as needed for heartburn.    [name] CARTIA XT 240 MG 24 hr capsule TAKE 1 CAPSULE BY MOUTH ONCE DAILY 90 capsule 1  . Cholecalciferol 2000 units CAPS Take 2,000 capsules by mouth daily.    . Coenzyme Q10 (COQ-10) 200 MG CAPS Take 200 mg by mouth every Monday, Wednesday, and Friday.    [name] ELIQUIS 5 MG TABS tablet TAKE 1 TABLET BY MOUTH TWICE DAILY (Patient taking differently: Take 5 mg by mouth 2 (two) times daily. ) 180 tablet 1  . escitalopram (LEXAPRO) 10 MG tablet Take 10 mg by mouth daily.     . fexofenadine (ALLEGRA) 180 MG tablet Take 180 mg  by mouth daily.    . furosemide (LASIX) 40 MG tablet Take 1 tablet (40 mg total) by mouth daily. 90 tablet 3  . levothyroxine (SYNTHROID) 100 MCG tablet Take 1 tablet (100 mcg total) by mouth daily before breakfast.    . losartan (COZAAR) 25 MG tablet TAKE 1 TABLET BY MOUTH ONCE DAILY 90 tablet 2  . metoprolol succinate (TOPROL-XL) 25 MG 24 hr tablet Take 1 tablet (25 mg total) by mouth 2 (two) times daily. Take 1 tablet in the AM and 1/2 tablet in the PM 135 tablet 1  . Multiple Vitamins-Minerals (ICAPS AREDS FORMULA PO) Take 1 capsule by  mouth 2 (two) times daily.    [name] Glycol-Propyl Glycol (SYSTANE OP) Place 2 drops into both eyes as needed (dry eyes).    . potassium chloride (K-DUR) 10 MEQ tablet Take 1 tablet (10 mEq total) by mouth daily. 30 tablet 6  . PRESCRIPTION MEDICATION Place 1 Dose into the left eye every 28 (twenty-eight) days.     . rosuvastatin (CRESTOR) 5 MG tablet Take 5 mg by mouth every Monday, Wednesday, and Friday.     . sodium chloride (OCEAN) 0.65 % SOLN nasal spray Place 1 spray into both nostrils as needed for congestion.     No current facility-administered medications for this encounter.     Allergies  Allergen Reactions  . Codeine Other (See Comments)    Patient isn't sure of reaction. Happens years ago - 7's  . Atorvastatin Other (See Comments)    Depression   . Penicillins Rash    Happened in 1960s Has patient had a PCN reaction causing immediate rash, facial/tongue/throat swelling, SOB or lightheadedness with hypotension: Unknown Has patient had a PCN reaction causing severe rash involving mucus membranes or skin necrosis: Unknown Has patient had a PCN reaction that required hospitalization: Unknown Has patient had a PCN reaction occurring within the last 10 years: No If all of the above answers are "NO", then may proceed with Cephalosporin use.   . Simvastatin Other (See Comments)    JOINT PAIN    Social History   Socioeconomic History  . Marital status: Widowed    Spouse name: Not on file  . Number of children: Not on file  . Years of education: Not on file  . Highest education level: Not on file  Occupational History  . Not on file  Social Needs  . Financial resource strain: Not on file  . Food insecurity:    Worry: Not on file    Inability: Not on file  . Transportation needs:    Medical: Not on file    Non-medical: Not on file  Tobacco Use  . Smoking status: Never Smoker  . Smokeless tobacco: Never Used  Substance and Sexual Activity  . Alcohol use:  Yes    Alcohol/week: 0.0 standard drinks    Comment: every day glass of wine  . Drug use: No  . Sexual activity: Not on file  Lifestyle  . Physical activity:    Days per week: Not on file    Minutes per session: Not on file  . Stress: Not on file  Relationships  . Social connections:    Talks on phone: Not on file    Gets together: Not on file    Attends religious service: Not on file    Active member of club or organization: Not on file    Attends meetings of clubs or organizations: Not on file  Relationship status: Not on file  . Intimate partner violence:    Fear of current or ex partner: Not on file    Emotionally abused: Not on file    Physically abused: Not on file    Forced sexual activity: Not on file  Other Topics Concern  . Not on file  Social History Narrative  . Not on file    Family History  Problem Relation Age of Onset  . Stroke Mother   . Thyroid disease Mother   . Stroke Father   . Alcoholism Father   . Asthma Sister   . Thyroid disease Sister   . Diabetes Brother   . Thyroid disease Sister   . Asthma Brother   . Heart attack Brother     ROS- All systems are reviewed and negative except as per the HPI above  Physical Exam: Vitals:   09/04/18 1405  BP: 122/86  Pulse: (!) 132  Weight: 69.9 kg  Height: 5\' 1"  (1.549 m)   Wt Readings from Last 3 Encounters:  09/04/18 69.9 kg  08/14/18 68.9 kg  08/07/18 69.2 kg    Labs: Lab Results  Component Value Date   NA 141 08/21/2018   K 3.5 08/21/2018   CL 100 08/14/2018   CO2 29 08/14/2018   GLUCOSE 110 (H) 08/21/2018   BUN 12 08/14/2018   CREATININE 0.78 08/14/2018   CALCIUM 9.6 08/14/2018   No results found for: INR Lab Results  Component Value Date   CHOL 192 07/07/2017   HDL 55 07/07/2017   LDLCALC 124 (H) 07/07/2017   TRIG 66 07/07/2017     GEN- The patient is well appearing, alert and oriented x 3 today.   Head- normocephalic, atraumatic Eyes-  Sclera clear, conjunctiva  pink Ears- hearing intact Oropharynx- clear Neck- supple, no JVP Lymph- no cervical lymphadenopathy Lungs- Clear to ausculation bilaterally, normal work of breathing Heart- rapid regular rate and rhythm, no murmurs, rubs or gallops, PMI not laterally displaced GI- soft, NT, ND, + BS Extremities- no clubbing, cyanosis, or edema MS- no significant deformity or atrophy Skin- no rash or lesion Psych- euthymic mood, full affect Neuro- strength and sensation are intact  EKG-atypical  atrial flutter at 132 bpm,  qrs 82 ms, qtc 444 ms Epic records reviewed   Assessment and Plan: 1. Persistent afib/flutter S/p ablation 6 weeks ago Successful cardioversion but early return of atrial flutter Does not want to go on any antiarrythmic's at this time would rather have an early retouch of ablation after 3 month f/u with Dr. Rayann Heman Is agreeable to more rate control Increse metoprolol succinate 25 mg bid Continue cartia 240 mg qd Continue eliquis 5 mg bid for chadsvasc score of at least 7   She will call on Monday with HR/BP readings and may increase rate control at that time  Dr Rayann Heman as scheduled in November  Carolyn Vazquez C. Coni Homesley, [facility] [address] [place], [place] [address] [phone]

## 2018-09-08 ENCOUNTER — Telehealth (HOSPITAL_COMMUNITY): Payer: Self-pay | Admitting: *Deleted

## 2018-09-08 MED ORDER — METOPROLOL SUCCINATE ER 25 MG PO TB24: ORAL_TABLET | ORAL | 1 refills | Status: DC

## 2018-09-08 NOTE — Telephone Encounter (Signed)
Pt BP/HR log patient is running between 111-128/79-93 with HRs from 97-132. Discussed with Roderic Palau NP can try increasing morning dose of metoprolol to 37.5mg  in the AM and 25mg  in the PM. Pt verbalized understanding.

## 2018-09-15 ENCOUNTER — Other Ambulatory Visit: Payer: Self-pay | Admitting: Neurology

## 2018-09-15 DIAGNOSIS — I63411 Cerebral infarction due to embolism of right middle cerebral artery: Secondary | ICD-10-CM

## 2018-09-16 ENCOUNTER — Other Ambulatory Visit: Payer: Self-pay | Admitting: Neurology

## 2018-09-16 DIAGNOSIS — I63411 Cerebral infarction due to embolism of right middle cerebral artery: Secondary | ICD-10-CM

## 2018-10-07 NOTE — Progress Notes (Signed)
Primary Care Physician: [name], MD Primary Cardiologist: Dr Marlou Porch Primary Electrophysiologist: Dr Rayann Heman  Carolyn Vazquez is a 77 y.o. female with a history of persistent atrial fibrillation who presents for follow up. She is now s/p ablation on 07/08/18 and s/p cardioversion on 08/21/18 and was in sinus rhythm for a few days but went back into afib. She is in sinus rhythm today. She states that she did have some chest pain immediately after her ablation but that has since resolved.The patient reports doing very well.    Today, she denies symptoms of palpitations, chest pain, shortness of breath, orthopnea, PND, lower extremity edema, dizziness, presyncope, syncope, snoring, daytime somnolence, bleeding, or neurologic sequela. The patient is tolerating medications without difficulties and is otherwise without complaint today.    Atrial Fibrillation Risk Factors:  she does not have symptoms or diagnosis of sleep apnea.  she does not have a history of rheumatic fever.   she has a BMI of Body mass index is 28.64 kg/m.[name] Filed Weights   10/08/18 0907  Weight: 151 lb 9.6 oz (68.8 kg)    LA size: 44mm   Atrial Fibrillation Management history:  Previous antiarrhythmic drugs: Flecainide  Previous cardioversions: 08/21/18  Previous ablations: 07/08/18  CHADS2VASC score: 7  Anticoagulation history: Eliquis 5 BID   Past Medical History:  Diagnosis Date  . Chronic diastolic CHF (congestive heart failure) ([place])   . Hyperlipidemia   . Hypertension   . Hypothyroidism   . Mild dilation of ascending aorta (HCC)   . Persistent atrial fibrillation    a. dx 06/2017.  . Stroke (cerebrum) ([place])   . 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 Wegener's granulomatosis ([place])    Past Surgical History:  Procedure Laterality Date  . ABDOMINAL HYSTERECTOMY  1979  . ATRIAL FIBRILLATION ABLATION N/A 07/08/2018   Procedure: ATRIAL FIBRILLATION ABLATION;  Surgeon: Thompson Grayer, MD;  Location: [facility];  Service: Cardiovascular;  Laterality: N/A;  . BUNIONECTOMY Right   . CARDIOVERSION N/A 08/05/2017   Procedure: CARDIOVERSION;  Surgeon: [name], MD;  Location: [facility] ENDOSCOPY;  Service: Cardiovascular;  Laterality: N/A;  . CARDIOVERSION N/A 09/20/2017   Procedure: CARDIOVERSION;  Surgeon: [name], MD;  Location: [facility] ENDOSCOPY;  Service: Cardiovascular;  Laterality: N/A;  . CARDIOVERSION N/A 08/21/2018   Procedure: CARDIOVERSION;  Surgeon: Thayer Headings, MD;  Location: [place];  Service: Cardiovascular;  Laterality: N/A;  . CHOLECYSTECTOMY    . 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 NASAL SINUS SURGERY      Current Outpatient Medications  Medication Sig Dispense Refill  . acetaminophen (TYLENOL) 500 MG tablet Take 1,000 mg by mouth 2 (two) times daily as needed for moderate pain or headache.    . B Complex Vitamins (B-COMPLEX/B-12) TABS Take 1 tablet by mouth daily.     . calcium elemental as carbonate (TUMS ULTRA 1000) 400 MG chewable tablet Chew 1,000 mg by mouth daily as needed for heartburn.    . Cholecalciferol 2000 units CAPS Take 2,000 capsules by mouth daily.    . Coenzyme Q10 (COQ-10) 200 MG CAPS Take 200 mg by mouth every Monday, Wednesday, and Friday.    [name] ELIQUIS 5 MG TABS tablet TAKE 1 TABLET BY MOUTH TWICE DAILY (Patient taking differently: Take 5 mg by mouth 2 (two) times daily. ) 180 tablet 1  . escitalopram (LEXAPRO) 10 MG tablet Take 10 mg by mouth daily.     [name]  fexofenadine (ALLEGRA) 180 MG tablet Take 180 mg by mouth daily.    . furosemide (LASIX) 40 MG tablet Take 1 tablet (40 mg total) by mouth daily. 90 tablet 3  . levothyroxine (SYNTHROID) 100 MCG tablet Take 1 tablet (100 mcg total) by mouth daily before breakfast.    . losartan (COZAAR) 25 MG tablet TAKE 1 TABLET BY MOUTH ONCE DAILY 90 tablet 2  . Multiple Vitamins-Minerals (ICAPS AREDS FORMULA PO) Take 1 capsule by mouth 2 (two) times daily.      [name] Glycol-Propyl Glycol (SYSTANE OP) Place 2 drops into both eyes as needed (dry eyes).    . potassium chloride (K-DUR) 10 MEQ tablet Take 1 tablet (10 mEq total) by mouth daily. 30 tablet 6  . PRESCRIPTION MEDICATION Place 1 Dose into the left eye every 28 (twenty-eight) days.     . rosuvastatin (CRESTOR) 5 MG tablet Take 5 mg by mouth every Monday, Wednesday, and Friday.     . sodium chloride (OCEAN) 0.65 % SOLN nasal spray Place 1 spray into both nostrils as needed for congestion.    [name] diltiazem (CARDIZEM CD) 120 MG 24 hr capsule Take 1 capsule (120 mg total) by mouth daily. 90 capsule 3  . metoprolol succinate (TOPROL XL) 25 MG 24 hr tablet Take 0.5 tablets (12.5 mg total) by mouth 2 (two) times daily. 90 tablet 3   No current facility-administered medications for this visit.     Allergies  Allergen Reactions  . Codeine Other (See Comments)    Patient isn't sure of reaction. Happens years ago - 20's  . Atorvastatin Other (See Comments)    Depression   . Penicillins Rash    Happened in 1960s Has patient had a PCN reaction causing immediate rash, facial/tongue/throat swelling, SOB or lightheadedness with hypotension: Unknown Has patient had a PCN reaction causing severe rash involving mucus membranes or skin necrosis: Unknown Has patient had a PCN reaction that required hospitalization: Unknown Has patient had a PCN reaction occurring within the last 10 years: No If all of the above answers are "NO", then may proceed with Cephalosporin use.   . Simvastatin Other (See Comments)    JOINT PAIN    Social History   Socioeconomic History  . Marital status: Widowed    Spouse name: Not on file  . Number of children: Not on file  . Years of education: Not on file  . Highest education level: Not on file  Occupational History  . Not on file  Social Needs  . Financial resource strain: Not on file  . Food insecurity:    Worry: Not on file    Inability: Not on file  .  Transportation needs:    Medical: Not on file    Non-medical: Not on file  Tobacco Use  . Smoking status: Never Smoker  . Smokeless tobacco: Never Used  Substance and Sexual Activity  . Alcohol use: Yes    Alcohol/week: 0.0 standard drinks    Comment: every day glass of wine  . Drug use: No  . Sexual activity: Not on file  Lifestyle  . Physical activity:    Days per week: Not on file    Minutes per session: Not on file  . Stress: Not on file  Relationships  . Social connections:    Talks on phone: Not on file    Gets together: Not on file    Attends religious service: Not on file    Active member of club  or organization: Not on file    Attends meetings of clubs or organizations: Not on file    Relationship status: Not on file  . Intimate partner violence:    Fear of current or ex partner: Not on file    Emotionally abused: Not on file    Physically abused: Not on file    Forced sexual activity: Not on file  Other Topics Concern  . Not on file  Social History Narrative  . Not on file    Family History  Problem Relation Age of Onset  . Stroke Mother   . Thyroid disease Mother   . Stroke Father   . Alcoholism Father   . Asthma Sister   . Thyroid disease Sister   . Diabetes Brother   . Thyroid disease Sister   . Asthma Brother   . Heart attack Brother     ROS- All systems are reviewed and negative except as per the HPI above.  Physical Exam: Vitals:   10/08/18 0907  BP: 132/80  Pulse: (!) 48  SpO2: 97%  Weight: 151 lb 9.6 oz (68.8 kg)  Height: 5\' 1"  (1.549 m)    GEN- The patient is well appearing, alert and oriented x 3 today.   Head- normocephalic, atraumatic Eyes-  Sclera clear, conjunctiva pink Ears- hearing intact Oropharynx- clear Neck- supple  Lungs- Clear to ausculation bilaterally, normal work of breathing Heart- Regular rate and rhythm, no murmurs, rubs or gallops  GI- soft, NT, ND, + BS Extremities- no clubbing, cyanosis, or edema MS- no  significant deformity or atrophy Skin- no rash or lesion Psych- euthymic mood, full affect Neuro- strength and sensation are intact  Wt Readings from Last 3 Encounters:  10/08/18 151 lb 9.6 oz (68.8 kg)  09/04/18 154 lb (69.9 kg)  08/14/18 152 lb (68.9 kg)    EKG today demonstrates sinius bradycardia HR 48 Echo LVEF 50-55%, LA 26mm, mild LVH demonstrated   Epic records are reviewed at length today  Assessment and Plan:  1. Persistant atrial fibrillation/flutter S/p ablation 07/08/18 S/p successful cardioversion but with return of atrial flutter.  Continue Eliquis for CHADS2VASC of at least 7. She is in sinus bradycardia today but denies any symptoms of dizziness or fatigue. Will decrease diltiazem to 120mg  daily Decrease Toprol to 12.5mg  BID  2. HTN Stable Changes as above.  Return in 3 months  Thompson Grayer MD, [place] 10/08/2018 9:57 AM

## 2018-10-08 ENCOUNTER — Ambulatory Visit (INDEPENDENT_AMBULATORY_CARE_PROVIDER_SITE_OTHER): Payer: Medicare Other | Admitting: Internal Medicine

## 2018-10-08 ENCOUNTER — Encounter: Payer: Self-pay | Admitting: Internal Medicine

## 2018-10-08 VITALS — BP 132/80 | HR 48 | Ht 61.0 in | Wt 151.6 lb

## 2018-10-08 DIAGNOSIS — I10 Essential (primary) hypertension: Secondary | ICD-10-CM | POA: Diagnosis not present

## 2018-10-08 DIAGNOSIS — I4892 Unspecified atrial flutter: Secondary | ICD-10-CM

## 2018-10-08 DIAGNOSIS — I4819 Other persistent atrial fibrillation: Secondary | ICD-10-CM | POA: Diagnosis not present

## 2018-10-08 MED ORDER — DILTIAZEM HCL ER COATED BEADS 120 MG PO CP24: 120.0000 mg | ORAL_CAPSULE | Freq: Every day | ORAL | 3 refills | Status: DC

## 2018-10-08 MED ORDER — METOPROLOL SUCCINATE ER 25 MG PO TB24: 12.5000 mg | ORAL_TABLET | Freq: Two times a day (BID) | ORAL | 3 refills | Status: DC

## 2018-10-08 NOTE — Patient Instructions (Addendum)
Medication Instructions:  Your physician has recommended you make the following change in your medication:   1.  Decrease your DILTIAZEM--Start taking diltiazem ER 120 mg one capsule by mouth daily.  2.  Decrease your TOPROL XL  25 mg---Take half tablet by mouth twice a day.  Labwork: None ordered.  Testing/Procedures: None ordered.  Follow-Up: Your physician wants you to follow-up in: 3 months with Dr. Rayann Heman.     ny Other Special Instructions Will Be Listed Below (If Applicable).  If you need a refill on your cardiac medications before your next appointment, please call your pharmacy.

## 2018-10-14 ENCOUNTER — Other Ambulatory Visit (HOSPITAL_COMMUNITY): Payer: Self-pay | Admitting: Nurse Practitioner

## 2018-10-14 ENCOUNTER — Telehealth (HOSPITAL_COMMUNITY): Payer: Self-pay | Admitting: *Deleted

## 2018-10-14 NOTE — Telephone Encounter (Signed)
Patient called in stating since decreasing metoprolol and cardizem when she wakes up in the morning she has this heaviness on her chest. After taking her medications the symptoms subside. Her BP is running 120-140/60-70s with HR in the 60s. She does not have exertional heaviness but does endorse shortness of breath but this is not a new symptom for her. Discussed with Roderic Palau NP - will continue to monitor if symptoms persist she should contact Dr. Marlou Porch for further workup.

## 2018-10-28 ENCOUNTER — Telehealth (HOSPITAL_COMMUNITY): Payer: Self-pay | Admitting: *Deleted

## 2018-10-28 NOTE — Telephone Encounter (Signed)
Pt cld to inform us that she had caught a stomach virus and is not sure that yesterday she was able to keep her blood thinner down. She is better today and is hydrating and able to keep her medications down.  Pt just wanted to make sure we were aware.

## 2018-12-04 ENCOUNTER — Telehealth (HOSPITAL_COMMUNITY): Payer: Self-pay | Admitting: *Deleted

## 2018-12-04 MED ORDER — METOPROLOL SUCCINATE ER 25 MG PO TB24: 25.0000 mg | ORAL_TABLET | Freq: Two times a day (BID) | ORAL | 3 refills | Status: DC

## 2018-12-04 NOTE — Telephone Encounter (Signed)
Patient called in stating since Monday her HRs have been elevated in the 120-140s. Currently HR 123 Bp 107/84 just doesn't feel as well. Patient will increase metoprolol to 25mg  BID and call in the AM with readings.

## 2018-12-05 MED ORDER — METOPROLOL SUCCINATE ER 25 MG PO TB24: ORAL_TABLET | ORAL | 3 refills | Status: DC

## 2018-12-05 NOTE — Addendum Note (Signed)
Addended by: Juluis Mire on: 12/05/2018 11:01 AM   Modules accepted: Orders

## 2018-12-05 NOTE — Telephone Encounter (Signed)
Pt back in rhythm in the 60s. Feels fatigued but otherwise without symptom. She will decrease metoprolol to 12.5mg  in the AM and 25mg  in the PM. She will call on Monday with symptoms.

## 2018-12-18 ENCOUNTER — Telehealth: Payer: Self-pay | Admitting: *Deleted

## 2018-12-18 NOTE — Telephone Encounter (Signed)
   [facility] Pre-operative Risk Assessment    Request for surgical clearance:  1. What type of surgery is being performed? CATARACT EXTRACTION   2. When is this surgery scheduled? 01/06/19   3. What type of clearance is required (medical clearance vs. Pharmacy clearance to hold med vs. Both)? MEDICAL  4. Are there any medications that need to be held prior to surgery and how long?PER [facility]. [facility] PT WILL NOT NEED TO HOLD BLOOD THINNERS PRIOR   5. Practice name and name of physician performing surgery? [facility]; DR. Tama High   6. What is your office phone number [phone] EXT [phone]   7.   What is your office fax number [phone]  8.   Anesthesia type (None, local, MAC, general) ? IV SEDATION   Carolyn Vazquez 12/18/2018, 9:15 AM  _________________________________________________________________   (provider comments below)

## 2018-12-19 NOTE — Telephone Encounter (Signed)
   Primary Cardiologist: Candee Furbish, MD  Chart reviewed as part of pre-operative protocol coverage. Cataract extractions are recognized in guidelines as low risk surgeries that do not typically require specific preoperative testing or holding of blood thinner therapy. Therefore, given past medical history and time since last visit, based on ACC/AHA guidelines, Carolyn Vazquez would be at acceptable risk for the planned procedure without further cardiovascular testing.   I will route this recommendation to the requesting party via Epic fax function and remove from pre-op pool.  Please call with questions.  Brittainy Simmons, PA-C 12/19/2018, 1:15 PM

## 2018-12-29 ENCOUNTER — Telehealth (HOSPITAL_COMMUNITY): Payer: Self-pay | Admitting: *Deleted

## 2018-12-29 MED ORDER — DILTIAZEM HCL ER COATED BEADS 120 MG PO CP24: 120.0000 mg | ORAL_CAPSULE | Freq: Two times a day (BID) | ORAL | 3 refills | Status: DC

## 2018-12-29 MED ORDER — METOPROLOL SUCCINATE ER 25 MG PO TB24: 25.0000 mg | ORAL_TABLET | Freq: Two times a day (BID) | ORAL | 3 refills | Status: DC

## 2018-12-29 NOTE — Telephone Encounter (Signed)
Patient called back in today stating her HR is back up for the last week with HR in the 120s. BP stable. She has already increased her metoprolol back to 25mg  BID. Saw her PCP today and her HR was 123 at the visit. She will increase cardizem back to 120mg  BID and call back tomorrow with report of HRs. She is scheduled for cataract surgery later in the month but has follow up with Dr. Rayann Heman 2/20.

## 2018-12-30 ENCOUNTER — Other Ambulatory Visit: Payer: Self-pay | Admitting: Family Medicine

## 2018-12-30 DIAGNOSIS — Z1231 Encounter for screening mammogram for malignant neoplasm of breast: Secondary | ICD-10-CM

## 2018-12-30 NOTE — Telephone Encounter (Signed)
Pt states her HR is in the 90s with BP 112/74. Will continue current regimen. If HRs go back into the 50s she will drop cardizem to once a day.

## 2019-01-01 ENCOUNTER — Other Ambulatory Visit (HOSPITAL_COMMUNITY): Payer: Self-pay | Admitting: Cardiology

## 2019-01-02 ENCOUNTER — Other Ambulatory Visit: Payer: Self-pay | Admitting: Family Medicine

## 2019-01-02 DIAGNOSIS — E2839 Other primary ovarian failure: Secondary | ICD-10-CM

## 2019-01-08 ENCOUNTER — Ambulatory Visit: Payer: Medicare Other | Admitting: Internal Medicine

## 2019-01-08 ENCOUNTER — Encounter: Payer: Self-pay | Admitting: Internal Medicine

## 2019-01-08 VITALS — BP 124/86 | HR 93 | Ht 61.0 in | Wt 154.4 lb

## 2019-01-08 DIAGNOSIS — I10 Essential (primary) hypertension: Secondary | ICD-10-CM | POA: Diagnosis not present

## 2019-01-08 DIAGNOSIS — I4819 Other persistent atrial fibrillation: Secondary | ICD-10-CM

## 2019-01-08 MED ORDER — DILTIAZEM HCL ER COATED BEADS 120 MG PO CP24: ORAL_CAPSULE | ORAL | 3 refills | Status: DC

## 2019-01-08 NOTE — Patient Instructions (Addendum)
Medication Instructions:  Your physician has recommended you make the following change in your medication:   1.  Increase your Cardizem 120 mg---Take 2 capsules (240 mg) in the morning and one capsule (120 mg) at night  Labwork: None ordered.  Testing/Procedures: None ordered.  Follow-Up: Your physician wants you to follow-up in: 3 months with Dr. Rayann Heman.     Any Other Special Instructions Will Be Listed Below (If Applicable).  If you need a refill on your cardiac medications before your next appointment, please call your pharmacy.

## 2019-01-08 NOTE — Progress Notes (Signed)
PCP: [name], MD Primary Cardiologist: Dr Marlou Porch Primary EP: Dr Rayann Heman  Carolyn Vazquez is a 78 y.o. female who presents today for routine electrophysiology followup.  Since last being seen in our clinic, the patient reports doing very well.  She continues to have afib.  She noticed fatigue and palpitations.  Today, she denies symptoms of chest pain, shortness of breath,  lower extremity edema, dizziness, presyncope, or syncope.  The patient is otherwise without complaint today.   Past Medical History:  Diagnosis Date  . Chronic diastolic CHF (congestive heart failure) ([place])   . Hyperlipidemia   . Hypertension   . Hypothyroidism   . Mild dilation of ascending aorta (HCC)   . Persistent atrial fibrillation    a. dx 06/2017.  . Stroke (cerebrum) ([place])   . 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 Wegener's granulomatosis ([place])    Past Surgical History:  Procedure Laterality Date  . ABDOMINAL HYSTERECTOMY  1979  . ATRIAL FIBRILLATION ABLATION N/A 07/08/2018   Procedure: ATRIAL FIBRILLATION ABLATION;  Surgeon: Thompson Grayer, MD;  Location: [place] [facility];  Service: Cardiovascular;  Laterality: N/A;  . BUNIONECTOMY Right   . CARDIOVERSION N/A 08/05/2017   Procedure: CARDIOVERSION;  Surgeon: [name], MD;  Location: [facility] ENDOSCOPY;  Service: Cardiovascular;  Laterality: N/A;  . CARDIOVERSION N/A 09/20/2017   Procedure: CARDIOVERSION;  Surgeon: [name], MD;  Location[facility] ENDOSCOPY;  Service: Cardiovascular;  Laterality: N/A;  . CARDIOVERSION N/A 08/21/2018   Procedure: CARDIOVERSION;  Surgeon: Thayer Headings, MD;  Location: [place];  Service: Cardiovascular;  Laterality: N/A;  . CHOLECYSTECTOMY    . 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 NASAL SINUS SURGERY      ROS- all systems are reviewed and negatives except as per HPI above  Current Outpatient Medications  Medication Sig Dispense Refill  . acetaminophen (TYLENOL) 500 MG tablet  Take 1,000 mg by mouth 2 (two) times daily as needed for moderate pain or headache.    [name] apixaban (ELIQUIS) 5 MG TABS tablet Take 1 tablet (5 mg total) by mouth 2 (two) times daily. 60 tablet 6  . B Complex Vitamins (B-COMPLEX/B-12) TABS Take 1 tablet by mouth daily.     . calcium elemental as carbonate (TUMS ULTRA 1000) 400 MG chewable tablet Chew 1,000 mg by mouth daily as needed for heartburn.    . Cholecalciferol 2000 units CAPS Take 2,000 capsules by mouth daily.    . Coenzyme Q10 (COQ-10) 200 MG CAPS Take 200 mg by mouth every Monday, Wednesday, and Friday.    . diltiazem (CARDIZEM CD) 120 MG 24 hr capsule Take 1 capsule (120 mg total) by mouth 2 (two) times daily. 90 capsule 3  . escitalopram (LEXAPRO) 10 MG tablet Take 10 mg by mouth daily.     . fexofenadine (ALLEGRA) 180 MG tablet Take 180 mg by mouth daily.    . furosemide (LASIX) 40 MG tablet Take 1 tablet (40 mg total) by mouth daily. 90 tablet 3  . levothyroxine (SYNTHROID) 100 MCG tablet Take 1 tablet (100 mcg total) by mouth daily before breakfast.    . losartan (COZAAR) 25 MG tablet TAKE 1 TABLET BY MOUTH ONCE DAILY 90 tablet 3  . metoprolol succinate (TOPROL XL) 25 MG 24 hr tablet Take 1 tablet (25 mg total) by mouth 2 (two) times daily. 90 tablet 3  . Multiple Vitamins-Minerals (ICAPS AREDS FORMULA PO) Take 1 capsule by mouth  2 (two) times daily.    [name] Glycol-Propyl Glycol (SYSTANE OP) Place 2 drops into both eyes as needed (dry eyes).    . potassium chloride (K-DUR) 10 MEQ tablet Take 1 tablet (10 mEq total) by mouth daily. 30 tablet 6  . PRESCRIPTION MEDICATION Place 1 Dose into the left eye every 28 (twenty-eight) days.     . rosuvastatin (CRESTOR) 5 MG tablet Take 5 mg by mouth every Monday, Wednesday, and Friday.     . sodium chloride (OCEAN) 0.65 % SOLN nasal spray Place 1 spray into both nostrils as needed for congestion.     No current facility-administered medications for this visit.     Physical  Exam: Vitals:   01/08/19 1107  BP: 124/86  Pulse: 93  SpO2: 96%  Weight: 154 lb 6.4 oz (70 kg)  Height: 5\' 1"  (1.549 m)    GEN- The patient is well appearing, alert and oriented x 3 today.   Head- normocephalic, atraumatic Eyes-  Sclera clear, conjunctiva pink Ears- hearing intact Oropharynx- clear Lungs- Clear to ausculation bilaterally, normal work of breathing Heart- irregular rate and rhythm, no murmurs, rubs or gallops, PMI not laterally displaced GI- soft, NT, ND, + BS Extremities- no clubbing, cyanosis, or edema  Wt Readings from Last 3 Encounters:  01/08/19 154 lb 6.4 oz (70 kg)  10/08/18 151 lb 9.6 oz (68.8 kg)  09/04/18 154 lb (69.9 kg)    EKG tracing ordered today is personally reviewed and shows afib, V rate 93 bpm  Assessment and Plan:  1. Persistent atrial fibrillation/ atrial flutter Continues to have episodes post ablation (performed 8/19) chads2vasc score is 7.  She is on eliquis The patient has symptomatic, recurrent persistent atrial fibrillation. she has failed medical therapy with flecainide. Therapeutic strategies for afib including medicine (tikosyn) and repeat ablation were discussed in detail with the patient today. Risk, benefits, and alternatives to EP study and radiofrequency ablation for afib were also discussed in detail today. These risks include but are not limited to stroke, bleeding, vascular damage, tamponade, perforation, damage to the esophagus, lungs, and other structures, pulmonary vein stenosis, worsening renal function, and death. The patient understands these risk and wishes to think about this further.  She will contact my office if she wishes to proceed with either ablation or tikosyn. I will increase diltiazem CD to 240mg  qam, 120mg  qpm in the interim.   2. HTN Stable No change required today  Return in 3 months  Thompson Grayer MD, [facility] 01/08/2019 11:13 AM

## 2019-01-09 NOTE — Addendum Note (Signed)
Addended by: Rose Phi on: 01/09/2019 09:07 AM   Modules accepted: Orders

## 2019-01-18 ENCOUNTER — Other Ambulatory Visit: Payer: Self-pay | Admitting: Nurse Practitioner

## 2019-02-09 ENCOUNTER — Telehealth (HOSPITAL_COMMUNITY): Payer: Self-pay | Admitting: *Deleted

## 2019-02-09 ENCOUNTER — Other Ambulatory Visit: Payer: Self-pay

## 2019-02-09 MED ORDER — ROSUVASTATIN CALCIUM 5 MG PO TABS: 5.0000 mg | ORAL_TABLET | ORAL | 2 refills | Status: DC

## 2019-02-09 MED ORDER — DILTIAZEM HCL ER COATED BEADS 120 MG PO CP24: ORAL_CAPSULE | ORAL | 3 refills | Status: DC

## 2019-02-09 NOTE — Telephone Encounter (Signed)
Patient called need refill on crestor sent to [facility] on [address]. 90 day supply please.

## 2019-02-11 ENCOUNTER — Ambulatory Visit: Payer: Medicare Other

## 2019-02-11 ENCOUNTER — Other Ambulatory Visit: Payer: Medicare Other

## 2019-02-25 ENCOUNTER — Ambulatory Visit: Payer: Medicare Other

## 2019-03-16 ENCOUNTER — Telehealth (HOSPITAL_COMMUNITY): Payer: Self-pay | Admitting: *Deleted

## 2019-03-16 MED ORDER — DILTIAZEM HCL ER COATED BEADS 120 MG PO CP24: ORAL_CAPSULE | ORAL | 3 refills | Status: DC

## 2019-03-16 NOTE — Telephone Encounter (Signed)
Pt states for the last week she has not felt well due to her HR being in the 94s. Discussed with Roderic Palau Np - will decrease cardizem to 120mg  BID and call with update of symptoms in 1 week. Pt in agreement.

## 2019-03-19 ENCOUNTER — Telehealth (HOSPITAL_COMMUNITY): Payer: Self-pay | Admitting: *Deleted

## 2019-03-19 NOTE — Telephone Encounter (Signed)
Patient approved for eliquis patient assistance through 11/19/19. App #[id number]

## 2019-03-23 MED ORDER — METOPROLOL SUCCINATE ER 25 MG PO TB24: 12.5000 mg | ORAL_TABLET | Freq: Two times a day (BID) | ORAL | 0 refills | Status: DC

## 2019-03-23 NOTE — Addendum Note (Signed)
Addended by: Juluis Mire on: 03/23/2019 03:08 PM   Modules accepted: Orders

## 2019-03-23 NOTE — Telephone Encounter (Signed)
Per Roderic Palau NP will try decreasing metoprolol to 1/2 tablet twice a day (12.5mg  BID). PT will call in a week with update.

## 2019-03-23 NOTE — Telephone Encounter (Signed)
Patient called with update of HRs since decreasing cardizem to 120mg  BID. She has not really noticed a change in energy or HRs with decrease. Wed 154/83 hr 48 Thurs 151/76 hr 51 Fri 147/79 hr 35 Sat 155/83 hr 76 Sun 156/90 Hr 45 Mon 125/74 hr 61  Sunday she did wake up abrupty at 3am due to a vivid dream with HRs in the 140s but by morning HRs had returned to above.  Will send to Roderic Palau Np for further recommendations. Pt states with any exertion she is very fatigued with current HRs.

## 2019-04-01 ENCOUNTER — Other Ambulatory Visit: Payer: Self-pay | Admitting: Cardiology

## 2019-04-01 ENCOUNTER — Other Ambulatory Visit: Payer: Self-pay | Admitting: Nurse Practitioner

## 2019-04-02 NOTE — Telephone Encounter (Signed)
Pt last telephone note stated that metoprolol was cut to 1/2 tablet bid, I called to verify dose and pt said it was moved back up to 1 full tablet daily.  Her current readings are all over the place 120/91 HR 110;  123/91 HR 118.  Yesterday 139/94 HR105.  Please advise.

## 2019-04-03 ENCOUNTER — Other Ambulatory Visit (HOSPITAL_COMMUNITY): Payer: Self-pay | Admitting: *Deleted

## 2019-04-03 MED ORDER — DILTIAZEM HCL ER COATED BEADS 120 MG PO CP24: ORAL_CAPSULE | ORAL | 3 refills | Status: DC

## 2019-04-03 NOTE — Telephone Encounter (Signed)
Pt last telephone note stated that metoprolol was cut to 1/2 tablet bid, I called to verify dose and pt said it was moved back up to 1 full tablet daily.  Her current readings are all over the place 120/91 HR 110;  123/91 HR 118.  Yesterday 139/94 HR105.  Please advise.    Patient is on metoprolol 25mg  BID - per donna carroll np - will increase cardizem back up to 240mg  in the AM and 120mg  in the PM.

## 2019-04-16 ENCOUNTER — Telehealth: Payer: Self-pay

## 2019-04-16 NOTE — Telephone Encounter (Signed)
Spoke with pt regarding appt on 04/17/19. Pt stated she is not able to access MyChart and would prefer a phone call. Pt was advise to check vitals day of appt. Pt questions were address.

## 2019-04-17 ENCOUNTER — Other Ambulatory Visit: Payer: Medicare Other

## 2019-04-17 ENCOUNTER — Other Ambulatory Visit: Payer: Self-pay

## 2019-04-17 ENCOUNTER — Encounter: Payer: Self-pay | Admitting: Internal Medicine

## 2019-04-17 ENCOUNTER — Telehealth (INDEPENDENT_AMBULATORY_CARE_PROVIDER_SITE_OTHER): Payer: Medicare Other | Admitting: Internal Medicine

## 2019-04-17 ENCOUNTER — Ambulatory Visit: Payer: Medicare Other

## 2019-04-17 DIAGNOSIS — I10 Essential (primary) hypertension: Secondary | ICD-10-CM

## 2019-04-17 DIAGNOSIS — I4891 Unspecified atrial fibrillation: Secondary | ICD-10-CM | POA: Diagnosis not present

## 2019-04-17 NOTE — Progress Notes (Signed)
Electrophysiology TeleHealth Note   Due to national recommendations of social distancing due to [place] 19, an audio telehealth visit is felt to be most appropriate for this patient at this time. Verbal consent was obtained from the patient today.  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  Date:  04/17/2019   ID:  Carolyn Vazquez, Carolyn Vazquez Nov 26, 1940, MRN [id number]  Location: patient's home  Provider location: [address] [place]  Evaluation Performed: Follow-up visit  PCP:  [name], MD  Cardiologist:  Candee Furbish, MD  Electrophysiologist:  Dr Rayann Heman  Chief Complaint:  afib  History of Present Illness:    Carolyn Vazquez is a 78 y.o. female who presents via audio  conferencing for a telehealth visit today.  Since last being seen in our clinic, the patient reports doing reasonably well.  She continues to have afib.  She thinks that she is persistently in afib. + fatigue and decreased exercise tolerance. Today, she denies symptoms of palpitations, chest pain, shortness of breath,  lower extremity edema, dizziness, presyncope, or syncope.  The patient is otherwise without complaint today.  The patient denies symptoms of fevers, chills, cough, or new SOB worrisome for COVID 19.  Past Medical History:  Diagnosis Date  . Chronic diastolic CHF (congestive heart failure) ([place])   . Hyperlipidemia   . Hypertension   . Hypothyroidism   . Mild dilation of ascending aorta (HCC)   . Persistent atrial fibrillation    a. dx 06/2017.  . Stroke (cerebrum) ([place])   . 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 Wegener's granulomatosis ([place])     Past Surgical History:  Procedure Laterality Date  . ABDOMINAL HYSTERECTOMY  1979  . ATRIAL FIBRILLATION ABLATION N/A 07/08/2018   Procedure: ATRIAL FIBRILLATION ABLATION;  Surgeon: Thompson Grayer, MD;  Location: [place] CV [facility];  Service: Cardiovascular;   Laterality: N/A;  . BUNIONECTOMY Right   . CARDIOVERSION N/A 08/05/2017   Procedure: CARDIOVERSION;  Surgeon: [name], MD;  Location: [facility] ENDOSCOPY;  Service: Cardiovascular;  Laterality: N/A;  . CARDIOVERSION N/A 09/20/2017   Procedure: CARDIOVERSION;  Surgeon: [name], MD;  Location: [facility] END[facility]COPY;  Service: Cardiovascular;  Laterality: N/A;  . CARDIOVERSION N/A 08/21/2018   Procedure: CARDIOVERSION;  Surgeon: Thayer Headings, MD;  Location: [place];  Service: Cardiovascular;  Laterality: N/A;  . CHOLECYSTECTOMY    . 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 NASAL SINUS SURGERY      Current Outpatient Medications  Medication Sig Dispense Refill  . acetaminophen (TYLENOL) 500 MG tablet Take 1,000 mg by mouth 2 (two) times daily as needed for moderate pain or headache.    [name] apixaban (ELIQUIS) 5 MG TABS tablet Take 1 tablet (5 mg total) by mouth 2 (two) times daily. 60 tablet 6  . B Complex Vitamins (B-COMPLEX/B-12) TABS Take 1 tablet by mouth daily.     . calcium elemental as carbonate (TUMS ULTRA 1000) 400 MG chewable tablet Chew 1,000 mg by mouth daily as needed for heartburn.    . Cholecalciferol 2000 units CAPS Take 2,000 capsules by mouth daily.    . Coenzyme Q10 (COQ-10) 200 MG CAPS Take 200 mg by mouth every Monday, Wednesday, and Friday.    . diltiazem (CARDIZEM CD) 120 MG 24 hr capsule Take 2 capsule in the AM and 1 capsule in the PM 270 capsule  3  . escitalopram (LEXAPRO) 10 MG tablet Take 10 mg by mouth daily.     . fexofenadine (ALLEGRA) 180 MG tablet Take 180 mg by mouth daily.    . furosemide (LASIX) 40 MG tablet Take 1 tablet (40 mg total) by mouth daily. Please make annual appt with Dr. Marlou Porch for future refills. Thank you 90 tablet 0  . levothyroxine (SYNTHROID) 100 MCG tablet Take 1 tablet (100 mcg total) by mouth daily before breakfast.    . losartan (COZAAR) 25 MG tablet TAKE 1 TABLET BY MOUTH ONCE DAILY 90 tablet 3  . metoprolol succinate  (TOPROL-XL) 25 MG 24 hr tablet Take 1 tablet by mouth twice daily 180 tablet 2  . Multiple Vitamins-Minerals (ICAPS AREDS FORMULA PO) Take 1 capsule by mouth 2 (two) times daily.    [name] Glycol-Propyl Glycol (SYSTANE OP) Place 2 drops into both eyes as needed (dry eyes).    . potassium chloride (K-DUR) 10 MEQ tablet Take 1 tablet (10 mEq total) by mouth daily. 30 tablet 6  . PRESCRIPTION MEDICATION Place 1 Dose into the left eye every 28 (twenty-eight) days.     . rosuvastatin (CRESTOR) 5 MG tablet Take 1 tablet (5 mg total) by mouth every Monday, Wednesday, and Friday. 120 tablet 2  . sodium chloride (OCEAN) 0.65 % SOLN nasal spray Place 1 spray into both nostrils as needed for congestion.     No current facility-administered medications for this visit.     Allergies:   Codeine; Atorvastatin; Penicillins; and Simvastatin   Social History:  The patient  reports that she has never smoked. She has never used smokeless tobacco. She reports current alcohol use. She reports that she does not use drugs.   Family History:  The patient's  family history includes Alcoholism in her father; Asthma in her brother and sister; Diabetes in her brother; Heart attack in her brother; Stroke in her father and mother; Thyroid disease in her mother, sister, and sister.   ROS:  Please see the history of present illness.   All other systems are personally reviewed and negative.    Exam:    Vital Signs:  BP 129/90   Pulse 88   Ht 5\' 1"  (1.549 m)   Wt 150 lb (68 kg)   LMP  (LMP Unknown)   BMI 28.34 kg/m   Well sounding  Labs/Other Tests and Data Reviewed:    Recent Labs: 08/14/2018: BUN 12; Creatinine, Ser 0.78; Platelets 258 08/21/2018: Hemoglobin 13.3; Potassium 3.5; Sodium 141   Wt Readings from Last 3 Encounters:  04/17/19 150 lb (68 kg)  01/08/19 154 lb 6.4 oz (70 kg)  10/08/18 151 lb 9.6 oz (68.8 kg)     Other studies personally reviewed: Additional studies/ records that were reviewed  today include: my prior office notes, my prior procedure Review of the above records today demonstrates: as above Prior radiographs: cardiac CT 06/2018   ASSESSMENT & PLAN:    1.  Persistent atrial fibrillation/ atrial flutter S/p prior PVI/ CTI ablation by me 06/2018 She has had ongoing issues with afib. The patient has symptomatic, recurrent  atrial fibrillation. she has failed medical therapy with flecainide. Chads2vasc score is 7.  she is anticoagulated with eliquis . Therapeutic strategies for afib including medicine (tikosyn) and repeat ablation were discussed in detail with the patient today. Risk, benefits, and alternatives to EP study and radiofrequency ablation for afib were also discussed in detail today. These risks include but are not limited to  stroke, bleeding, vascular damage, tamponade, perforation, damage to the esophagus, lungs, and other structures, pulmonary vein stenosis, worsening renal function, and death. The patient understands these risk and wishes to proceed.  We will therefore proceed with catheter ablation at the next available time.  Carto, ICE, anesthesia are requested for the procedure.  Will also obtain TEE prior to the procedure to exclude LAA thrombus and further evaluate atrial anatomy.  2. HTN Stable No change required today  3. COVID 19 screen The patient denies symptoms of COVID 19 at this time.  The importance of social distancing was discussed today.   Current medicines are reviewed at length with the patient today.   The patient does not have concerns regarding her medicines.  The following changes were made today:  none  Labs/ tests ordered today include:  No orders of the defined types were placed in this encounter.  Patient Risk:  after full review of this patients clinical status, I feel that they are at moderate risk at this time.  Today, I have spent 22 minutes with the patient with telehealth technology discussing afib .    Army Fossa, MD  04/17/2019 8:20 AM     [facility] [address] [place] [address] [phone] (office) [phone] (fax)

## 2019-04-17 NOTE — H&P (View-Only) (Signed)
Electrophysiology TeleHealth Note   Due to national recommendations of social distancing due to [place] 19, an audio telehealth visit is felt to be most appropriate for this patient at this time. Verbal consent was obtained from the patient today.  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  Date:  04/17/2019   ID:  Carolyn, Vazquez [birth year], MRN [id number]  Location: patient's home  Provider location: [address], [place]  Evaluation Performed: Follow-up visit  PCP:  [name], MD  Cardiologist:  Candee Furbish, MD  Electrophysiologist:  Dr Rayann Heman  Chief Complaint:  afib  History of Present Illness:    Carolyn Vazquez is a 78 y.o. female who presents via audio  conferencing for a telehealth visit today.  Since last being seen in our clinic, the patient reports doing reasonably well.  She continues to have afib.  She thinks that she is persistently in afib. + fatigue and decreased exercise tolerance. Today, she denies symptoms of palpitations, chest pain, shortness of breath,  lower extremity edema, dizziness, presyncope, or syncope.  The patient is otherwise without complaint today.  The patient denies symptoms of fevers, chills, cough, or new SOB worrisome for COVID 19.  Past Medical History:  Diagnosis Date  . Chronic diastolic CHF (congestive heart failure) ([place])   . Hyperlipidemia   . Hypertension   . Hypothyroidism   . Mild dilation of ascending aorta (HCC)   . Persistent atrial fibrillation    a. dx 06/2017.  . Stroke (cerebrum) ([place])   . 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 Wegener's granulomatosis ([place])     Past Surgical History:  Procedure Laterality Date  . ABDOMINAL HYSTERECTOMY  1979  . ATRIAL FIBRILLATION ABLATION N/A 07/08/2018   Procedure: ATRIAL FIBRILLATION ABLATION;  Surgeon: Thompson Grayer, MD;  Location: [place] CV LAB;  Service: Cardiovascular;   Laterality: N/A;  . BUNIONECTOMY Right   . CARDIOVERSION N/A 08/05/2017   Procedure: CARDIOVERSION;  Surgeon: [name], MD;  Location[facility] ENDOSCOPY;  Service: Cardiovascular;  Laterality: N/A;  . CARDIOVERSION N/A 09/20/2017   Procedure: CARDIOVERSION;  Surgeon: [name], MD;  Location: [facility] ENDOSCOPY;  Service: Cardiovascular;  Laterality: N/A;  . CARDIOVERSION N/A 08/21/2018   Procedure: CARDIOVERSION;  Surgeon: Thayer Headings, MD;  Location: [place];  Service: Cardiovascular;  Laterality: N/A;  . CHOLECYSTECTOMY    . 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 NASAL SINUS SURGERY      Current Outpatient Medications  Medication Sig Dispense Refill  . acetaminophen (TYLENOL) 500 MG tablet Take 1,000 mg by mouth 2 (two) times daily as needed for moderate pain or headache.    [name] apixaban (ELIQUIS) 5 MG TABS tablet Take 1 tablet (5 mg total) by mouth 2 (two) times daily. 60 tablet 6  . B Complex Vitamins (B-COMPLEX/B-12) TABS Take 1 tablet by mouth daily.     . calcium elemental as carbonate (TUMS ULTRA 1000) 400 MG chewable tablet Chew 1,000 mg by mouth daily as needed for heartburn.    . Cholecalciferol 2000 units CAPS Take 2,000 capsules by mouth daily.    . Coenzyme Q10 (COQ-10) 200 MG CAPS Take 200 mg by mouth every Monday, Wednesday, and Friday.    . diltiazem (CARDIZEM CD) 120 MG 24 hr capsule Take 2 capsule in the AM and 1 capsule in the PM 270 capsule  3  . escitalopram (LEXAPRO) 10 MG tablet Take 10 mg by mouth daily.     . fexofenadine (ALLEGRA) 180 MG tablet Take 180 mg by mouth daily.    . furosemide (LASIX) 40 MG tablet Take 1 tablet (40 mg total) by mouth daily. Please make annual appt with Dr. Marlou Porch for future refills. Thank you 90 tablet 0  . levothyroxine (SYNTHROID) 100 MCG tablet Take 1 tablet (100 mcg total) by mouth daily before breakfast.    . losartan (COZAAR) 25 MG tablet TAKE 1 TABLET BY MOUTH ONCE DAILY 90 tablet 3  . metoprolol succinate  (TOPROL-XL) 25 MG 24 hr tablet Take 1 tablet by mouth twice daily 180 tablet 2  . Multiple Vitamins-Minerals (ICAPS AREDS FORMULA PO) Take 1 capsule by mouth 2 (two) times daily.    [name] Glycol-Propyl Glycol (SYSTANE OP) Place 2 drops into both eyes as needed (dry eyes).    . potassium chloride (K-DUR) 10 MEQ tablet Take 1 tablet (10 mEq total) by mouth daily. 30 tablet 6  . PRESCRIPTION MEDICATION Place 1 Dose into the left eye every 28 (twenty-eight) days.     . rosuvastatin (CRESTOR) 5 MG tablet Take 1 tablet (5 mg total) by mouth every Monday, Wednesday, and Friday. 120 tablet 2  . sodium chloride (OCEAN) 0.65 % SOLN nasal spray Place 1 spray into both nostrils as needed for congestion.     No current facility-administered medications for this visit.     Allergies:   Codeine; Atorvastatin; Penicillins; and Simvastatin   Social History:  The patient  reports that she has never smoked. She has never used smokeless tobacco. She reports current alcohol use. She reports that she does not use drugs.   Family History:  The patient's  family history includes Alcoholism in her father; Asthma in her brother and sister; Diabetes in her brother; Heart attack in her brother; Stroke in her father and mother; Thyroid disease in her mother, sister, and sister.   ROS:  Please see the history of present illness.   All other systems are personally reviewed and negative.    Exam:    Vital Signs:  BP 129/90   Pulse 88   Ht 5\' 1"  (1.549 m)   Wt 150 lb (68 kg)   LMP  (LMP Unknown)   BMI 28.34 kg/m   Well sounding  Labs/Other Tests and Data Reviewed:    Recent Labs: 08/14/2018: BUN 12; Creatinine, Ser 0.78; Platelets 258 08/21/2018: Hemoglobin 13.3; Potassium 3.5; Sodium 141   Wt Readings from Last 3 Encounters:  04/17/19 150 lb (68 kg)  01/08/19 154 lb 6.4 oz (70 kg)  10/08/18 151 lb 9.6 oz (68.8 kg)     Other studies personally reviewed: Additional studies/ records that were reviewed  today include: my prior office notes, my prior procedure Review of the above records today demonstrates: as above Prior radiographs: cardiac CT 06/2018   ASSESSMENT & PLAN:    1.  Persistent atrial fibrillation/ atrial flutter S/p prior PVI/ CTI ablation by me 06/2018 She has had ongoing issues with afib. The patient has symptomatic, recurrent  atrial fibrillation. she has failed medical therapy with flecainide. Chads2vasc score is 7.  she is anticoagulated with eliquis . Therapeutic strategies for afib including medicine (tikosyn) and repeat ablation were discussed in detail with the patient today. Risk, benefits, and alternatives to EP study and radiofrequency ablation for afib were also discussed in detail today. These risks include but are not limited to  stroke, bleeding, vascular damage, tamponade, perforation, damage to the esophagus, lungs, and other structures, pulmonary vein stenosis, worsening renal function, and death. The patient understands these risk and wishes to proceed.  We will therefore proceed with catheter ablation at the next available time.  Carto, ICE, anesthesia are requested for the procedure.  Will also obtain TEE prior to the procedure to exclude LAA thrombus and further evaluate atrial anatomy.  2. HTN Stable No change required today  3. COVID 19 screen The patient denies symptoms of COVID 19 at this time.  The importance of social distancing was discussed today.   Current medicines are reviewed at length with the patient today.   The patient does not have concerns regarding her medicines.  The following changes were made today:  none  Labs/ tests ordered today include:  No orders of the defined types were placed in this encounter.  Patient Risk:  after full review of this patients clinical status, I feel that they are at moderate risk at this time.  Today, I have spent 22 minutes with the patient with telehealth technology discussing afib .    Army Fossa, MD  04/17/2019 8:20 AM     [facility] [address] [place] [address] [phone] (office) [phone] (fax)

## 2019-04-24 ENCOUNTER — Telehealth: Payer: Self-pay | Admitting: Internal Medicine

## 2019-04-24 DIAGNOSIS — I4891 Unspecified atrial fibrillation: Secondary | ICD-10-CM

## 2019-04-24 NOTE — Telephone Encounter (Signed)
Returned call to Pt to discuss ablation possibly on June 11.  Per Pt she now thinks she may go to the beach with her daughter and grandchild on week of June 15 and is concerned about riding in the car for hours after an ablation.  Will call back Monday to confirm.

## 2019-04-24 NOTE — Telephone Encounter (Signed)
Patient is calling to see when her ablation may be scheduled.

## 2019-04-27 ENCOUNTER — Other Ambulatory Visit: Payer: Medicare Other | Admitting: *Deleted

## 2019-04-27 ENCOUNTER — Other Ambulatory Visit: Payer: Self-pay

## 2019-04-27 ENCOUNTER — Other Ambulatory Visit (HOSPITAL_COMMUNITY)
Admission: RE | Admit: 2019-04-27 | Discharge: 2019-04-27 | Disposition: A | Discharge status: Home / Self Care | Payer: Medicare Other | Source: Ambulatory Visit | Attending: Internal Medicine | Admitting: Internal Medicine

## 2019-04-27 DIAGNOSIS — I4891 Unspecified atrial fibrillation: Secondary | ICD-10-CM

## 2019-04-27 DIAGNOSIS — Z1159 Encounter for screening for other viral diseases: Secondary | ICD-10-CM | POA: Diagnosis present

## 2019-04-27 LAB — CBC WITH DIFFERENTIAL/PLATELET
Basophils Absolute: 0.1 10*3/uL (ref 0.0–0.2)
Basos: 1 %
EOS (ABSOLUTE): 0.5 10*3/uL — ABNORMAL HIGH (ref 0.0–0.4)
Eos: 6 %
Hematocrit: 39.8 % (ref 34.0–46.6)
Hemoglobin: 13.9 g/dL (ref 11.1–15.9)
Immature Grans (Abs): 0 10*3/uL (ref 0.0–0.1)
Immature Granulocytes: 0 %
Lymphocytes Absolute: 2.1 10*3/uL (ref 0.7–3.1)
Lymphs: 27 %
MCH: 31.6 pg (ref 26.6–33.0)
MCHC: 34.9 g/dL (ref 31.5–35.7)
MCV: 91 fL (ref 79–97)
Monocytes Absolute: 0.8 10*3/uL (ref 0.1–0.9)
Monocytes: 10 %
Neutrophils Absolute: 4.4 10*3/uL (ref 1.4–7.0)
Neutrophils: 56 %
Platelets: 268 10*3/uL (ref 150–450)
RBC: 4.4 x10E6/uL (ref 3.77–5.28)
RDW: 11.6 % — ABNORMAL LOW (ref 11.7–15.4)
WBC: 7.9 10*3/uL (ref 3.4–10.8)

## 2019-04-27 LAB — BASIC METABOLIC PANEL
BUN/Creatinine Ratio: 15 (ref 12–28)
BUN: 14 mg/dL (ref 8–27)
CO2: 25 mmol/L (ref 20–29)
Calcium: 9.4 mg/dL (ref 8.7–10.3)
Chloride: 98 mmol/L (ref 96–106)
Creatinine, Ser: 0.93 mg/dL (ref 0.57–1.00)
GFR calc Af Amer: 68 mL/min/{1.73_m2} (ref >59)
GFR calc non Af Amer: 59 mL/min/{1.73_m2} — ABNORMAL LOW (ref >59)
Glucose: 138 mg/dL — ABNORMAL HIGH (ref 65–99)
Potassium: 4.1 mmol/L (ref 3.5–5.2)
Sodium: 140 mmol/L (ref 134–144)

## 2019-04-27 NOTE — Telephone Encounter (Signed)
Pt scheduled for afib ablation on June 11 at 11:00 am with Dr. Rayann Heman  Pt will get labs/covid test today  Left detailed message for Pt notifying procedure had been approved.  Verbal instruction given to Pt.

## 2019-04-28 LAB — NOVEL CORONAVIRUS, NAA (HOSP ORDER, SEND-OUT TO REF LAB; TAT 18-24 HRS): SARS-CoV-2, NAA: NOT DETECTED

## 2019-04-30 ENCOUNTER — Encounter (HOSPITAL_COMMUNITY): Payer: Self-pay | Admitting: Certified Registered Nurse Anesthetist

## 2019-04-30 ENCOUNTER — Other Ambulatory Visit: Payer: Self-pay

## 2019-04-30 ENCOUNTER — Ambulatory Visit (HOSPITAL_COMMUNITY): Payer: Medicare Other | Admitting: Certified Registered Nurse Anesthetist

## 2019-04-30 ENCOUNTER — Ambulatory Visit (HOSPITAL_COMMUNITY): Payer: Medicare Other

## 2019-04-30 ENCOUNTER — Ambulatory Visit (HOSPITAL_COMMUNITY)
Admission: RE | Admit: 2019-04-30 | Discharge: 2019-04-30 | Disposition: A | Discharge status: Home / Self Care | Payer: Medicare Other | Attending: Internal Medicine | Admitting: Internal Medicine

## 2019-04-30 ENCOUNTER — Encounter (HOSPITAL_COMMUNITY)
Admission: RE | Disposition: A | Discharge status: Home / Self Care | Payer: Self-pay | Source: Home / Self Care | Attending: Internal Medicine

## 2019-04-30 DIAGNOSIS — Z8673 Personal history of transient ischemic attack (TIA), and cerebral infarction without residual deficits: Secondary | ICD-10-CM | POA: Diagnosis not present

## 2019-04-30 DIAGNOSIS — Z888 Allergy status to other drugs, medicaments and biological substances status: Secondary | ICD-10-CM | POA: Insufficient documentation

## 2019-04-30 DIAGNOSIS — Z885 Allergy status to narcotic agent status: Secondary | ICD-10-CM | POA: Insufficient documentation

## 2019-04-30 DIAGNOSIS — I110 Hypertensive heart disease with heart failure: Secondary | ICD-10-CM | POA: Insufficient documentation

## 2019-04-30 DIAGNOSIS — Z7901 Long term (current) use of anticoagulants: Secondary | ICD-10-CM | POA: Diagnosis not present

## 2019-04-30 DIAGNOSIS — E039 Hypothyroidism, unspecified: Secondary | ICD-10-CM | POA: Diagnosis not present

## 2019-04-30 DIAGNOSIS — I4819 Other persistent atrial fibrillation: Secondary | ICD-10-CM | POA: Insufficient documentation

## 2019-04-30 DIAGNOSIS — E785 Hyperlipidemia, unspecified: Secondary | ICD-10-CM | POA: Insufficient documentation

## 2019-04-30 DIAGNOSIS — Z880 Allergy status to penicillin: Secondary | ICD-10-CM | POA: Diagnosis not present

## 2019-04-30 DIAGNOSIS — I5032 Chronic diastolic (congestive) heart failure: Secondary | ICD-10-CM | POA: Insufficient documentation

## 2019-04-30 DIAGNOSIS — Z90710 Acquired absence of both cervix and uterus: Secondary | ICD-10-CM | POA: Diagnosis not present

## 2019-04-30 DIAGNOSIS — Z823 Family history of stroke: Secondary | ICD-10-CM | POA: Insufficient documentation

## 2019-04-30 DIAGNOSIS — Z79890 Hormone replacement therapy (postmenopausal): Secondary | ICD-10-CM | POA: Insufficient documentation

## 2019-04-30 DIAGNOSIS — Z8349 Family history of other endocrine, nutritional and metabolic diseases: Secondary | ICD-10-CM | POA: Insufficient documentation

## 2019-04-30 DIAGNOSIS — I4892 Unspecified atrial flutter: Secondary | ICD-10-CM | POA: Insufficient documentation

## 2019-04-30 DIAGNOSIS — I483 Typical atrial flutter: Secondary | ICD-10-CM | POA: Diagnosis not present

## 2019-04-30 DIAGNOSIS — Z8249 Family history of ischemic heart disease and other diseases of the circulatory system: Secondary | ICD-10-CM | POA: Diagnosis not present

## 2019-04-30 DIAGNOSIS — Z79899 Other long term (current) drug therapy: Secondary | ICD-10-CM | POA: Diagnosis not present

## 2019-04-30 HISTORY — PX: ATRIAL FIBRILLATION ABLATION: EP1191

## 2019-04-30 LAB — POCT ACTIVATED CLOTTING TIME
Activated Clotting Time: 285 seconds
Activated Clotting Time: 345 seconds

## 2019-04-30 SURGERY — ATRIAL FIBRILLATION ABLATION
Anesthesia: General

## 2019-04-30 MED ORDER — SODIUM CHLORIDE 0.9% FLUSH: 3.0000 mL | INTRAVENOUS | Status: DC | PRN

## 2019-04-30 MED ORDER — HEPARIN (PORCINE) IN NACL 1000-0.9 UT/500ML-% IV SOLN
INTRAVENOUS | Status: AC
  Filled 2019-04-30: qty 500

## 2019-04-30 MED ORDER — HEPARIN SODIUM (PORCINE) 1000 UNIT/ML IJ SOLN
INTRAMUSCULAR | Status: DC | PRN
  Administered 2019-04-30: 1000 [IU] via INTRAVENOUS
  Administered 2019-04-30: 12000 [IU] via INTRAVENOUS

## 2019-04-30 MED ORDER — HEPARIN (PORCINE) IN NACL 1000-0.9 UT/500ML-% IV SOLN
INTRAVENOUS | Status: DC | PRN
  Administered 2019-04-30: 500 mL

## 2019-04-30 MED ORDER — ACETAMINOPHEN 500 MG PO TABS
ORAL_TABLET | ORAL | Status: AC
  Filled 2019-04-30: qty 2

## 2019-04-30 MED ORDER — SODIUM CHLORIDE 0.9 % IV SOLN: 250.0000 mL | INTRAVENOUS | Status: DC | PRN

## 2019-04-30 MED ORDER — ROCURONIUM BROMIDE 10 MG/ML (PF) SYRINGE
PREFILLED_SYRINGE | INTRAVENOUS | Status: DC | PRN
  Administered 2019-04-30: 30 mg via INTRAVENOUS
  Administered 2019-04-30: 50 mg via INTRAVENOUS

## 2019-04-30 MED ORDER — SODIUM CHLORIDE 0.9% FLUSH: 3.0000 mL | Freq: Two times a day (BID) | INTRAVENOUS | Status: DC

## 2019-04-30 MED ORDER — HEPARIN SODIUM (PORCINE) 1000 UNIT/ML IJ SOLN
INTRAMUSCULAR | Status: AC
  Filled 2019-04-30: qty 2

## 2019-04-30 MED ORDER — HEPARIN SODIUM (PORCINE) 1000 UNIT/ML IJ SOLN
INTRAMUSCULAR | Status: DC | PRN
  Administered 2019-04-30: 1000 [IU] via INTRAVENOUS
  Administered 2019-04-30: 5000 [IU] via INTRAVENOUS

## 2019-04-30 MED ORDER — MIDAZOLAM HCL 2 MG/2ML IJ SOLN
INTRAMUSCULAR | Status: DC | PRN
  Administered 2019-04-30: 1 mg via INTRAVENOUS

## 2019-04-30 MED ORDER — PHENYLEPHRINE HCL (PRESSORS) 10 MG/ML IV SOLN
INTRAVENOUS | Status: DC | PRN
  Administered 2019-04-30 (×3): 80 ug via INTRAVENOUS

## 2019-04-30 MED ORDER — PROPOFOL 10 MG/ML IV BOLUS
INTRAVENOUS | Status: DC | PRN
  Administered 2019-04-30: 100 mg via INTRAVENOUS

## 2019-04-30 MED ORDER — PROTAMINE SULFATE 10 MG/ML IV SOLN
INTRAVENOUS | Status: DC | PRN
  Administered 2019-04-30: 10 mg via INTRAVENOUS
  Administered 2019-04-30: 30 mg via INTRAVENOUS

## 2019-04-30 MED ORDER — ACETAMINOPHEN 500 MG PO TABS
1000.0000 mg | ORAL_TABLET | Freq: Once | ORAL | Status: AC
  Administered 2019-04-30: 1000 mg via ORAL
  Filled 2019-04-30: qty 2

## 2019-04-30 MED ORDER — SUGAMMADEX SODIUM 200 MG/2ML IV SOLN
INTRAVENOUS | Status: DC | PRN
  Administered 2019-04-30: 200 mg via INTRAVENOUS

## 2019-04-30 MED ORDER — ISOPROTERENOL HCL 0.2 MG/ML IJ SOLN
INTRAVENOUS | Status: DC | PRN
  Administered 2019-04-30: 10 ug/min via INTRAVENOUS

## 2019-04-30 MED ORDER — LIDOCAINE 2% (20 MG/ML) 5 ML SYRINGE
INTRAMUSCULAR | Status: DC | PRN
  Administered 2019-04-30: 80 mg via INTRAVENOUS

## 2019-04-30 MED ORDER — ACETAMINOPHEN 325 MG PO TABS: 650.0000 mg | ORAL_TABLET | ORAL | Status: DC | PRN

## 2019-04-30 MED ORDER — ISOPROTERENOL HCL 0.2 MG/ML IJ SOLN
INTRAMUSCULAR | Status: AC
  Filled 2019-04-30: qty 5

## 2019-04-30 MED ORDER — ONDANSETRON HCL 4 MG/2ML IJ SOLN
INTRAMUSCULAR | Status: DC | PRN
  Administered 2019-04-30: 4 mg via INTRAVENOUS

## 2019-04-30 MED ORDER — PANTOPRAZOLE SODIUM 40 MG PO TBEC: 40.0000 mg | DELAYED_RELEASE_TABLET | Freq: Every day | ORAL | 0 refills | Status: DC

## 2019-04-30 MED ORDER — SUCCINYLCHOLINE CHLORIDE 200 MG/10ML IV SOSY
PREFILLED_SYRINGE | INTRAVENOUS | Status: DC | PRN
  Administered 2019-04-30: 80 mg via INTRAVENOUS

## 2019-04-30 MED ORDER — ONDANSETRON HCL 4 MG/2ML IJ SOLN: 4.0000 mg | Freq: Four times a day (QID) | INTRAMUSCULAR | Status: DC | PRN

## 2019-04-30 MED ORDER — APIXABAN 5 MG PO TABS
5.0000 mg | ORAL_TABLET | Freq: Once | ORAL | Status: AC
  Administered 2019-04-30: 5 mg via ORAL
  Filled 2019-04-30 (×2): qty 1

## 2019-04-30 MED ORDER — BUPIVACAINE HCL (PF) 0.25 % IJ SOLN
INTRAMUSCULAR | Status: AC
  Filled 2019-04-30: qty 30

## 2019-04-30 MED ORDER — DEXAMETHASONE SODIUM PHOSPHATE 10 MG/ML IJ SOLN
INTRAMUSCULAR | Status: DC | PRN
  Administered 2019-04-30: 10 mg via INTRAVENOUS

## 2019-04-30 MED ORDER — SODIUM CHLORIDE 0.9 % IV SOLN
INTRAVENOUS | Status: DC | PRN
  Administered 2019-04-30: 20 ug/min via INTRAVENOUS

## 2019-04-30 MED ORDER — SODIUM CHLORIDE 0.9 % IV SOLN
INTRAVENOUS | Status: DC
  Administered 2019-04-30: 10:00:00 via INTRAVENOUS

## 2019-04-30 MED ORDER — FENTANYL CITRATE (PF) 250 MCG/5ML IJ SOLN
INTRAMUSCULAR | Status: DC | PRN
  Administered 2019-04-30: 100 ug via INTRAVENOUS

## 2019-04-30 SURGICAL SUPPLY — 16 items
BLANKET WARM UNDERBOD FULL ACC (MISCELLANEOUS) ×2 IMPLANT
CATH MAPPNG PENTARAY F 2-6-2MM (CATHETERS) ×1 IMPLANT
CATH NAVISTAR SMARTTOUCH DF (ABLATOR) ×2 IMPLANT
CATH SOUNDSTAR 3D IMAGING (CATHETERS) ×2 IMPLANT
CATH WEBSTER BI DIR CS D-F CRV (CATHETERS) ×2 IMPLANT
COVER SWIFTLINK CONNECTOR (BAG) ×2 IMPLANT
NEEDLE BAYLIS TRANSSEPTAL 71CM (NEEDLE) ×2 IMPLANT
PACK EP LATEX FREE (CUSTOM PROCEDURE TRAY) ×1
PACK EP LF (CUSTOM PROCEDURE TRAY) ×1 IMPLANT
PAD PRO RADIOLUCENT 2001M-C (PAD) ×2 IMPLANT
PATCH CARTO3 (PAD) ×2 IMPLANT
PENTARAY F 2-6-2MM (CATHETERS) ×2
SHEATH AVANTI 11F 11CM (SHEATH) ×2 IMPLANT
SHEATH PINNACLE 7F 10CM (SHEATH) ×4 IMPLANT
SHEATH SWARTZ TS SL2 63CM 8.5F (SHEATH) ×2 IMPLANT
TUBING SMART ABLATE COOLFLOW (TUBING) ×2 IMPLANT

## 2019-04-30 NOTE — Progress Notes (Signed)
Report received from Janice Walker,RN 

## 2019-04-30 NOTE — Anesthesia Preprocedure Evaluation (Addendum)
Anesthesia Evaluation  Patient identified by MRN, date of birth, ID band Patient awake    Reviewed: Allergy & Precautions, H&P , NPO status , Patient's Chart, lab work & pertinent test results, reviewed documented beta blocker date and time   Airway Mallampati: II  TM Distance: >3 FB Neck ROM: Full    Dental no notable dental hx. (+) Teeth Intact, Dental Advisory Given   Pulmonary neg pulmonary ROS,    Pulmonary exam normal breath sounds clear to auscultation       Cardiovascular hypertension, Pt. on medications and Pt. on home beta blockers +CHF  + dysrhythmias Atrial Fibrillation  Rhythm:Irregular Rate:Normal     Neuro/Psych TIACVA negative psych ROS   GI/Hepatic negative GI ROS, Neg liver ROS,   Endo/Other  Hypothyroidism   Renal/GU negative Renal ROS  negative genitourinary   Musculoskeletal   Abdominal   Peds  Hematology negative hematology ROS (+)   Anesthesia Other Findings   Reproductive/Obstetrics negative OB ROS                            Anesthesia Physical Anesthesia Plan  ASA: III  Anesthesia Plan: General   Post-op Pain Management:    Induction: Intravenous  PONV Risk Score and Plan: 4 or greater and Ondansetron, Dexamethasone and Treatment may vary due to age or medical condition  Airway Management Planned: Oral ETT  Additional Equipment:   Intra-op Plan:   Post-operative Plan: Extubation in OR  Informed Consent: I have reviewed the patients History and Physical, chart, labs and discussed the procedure including the risks, benefits and alternatives for the proposed anesthesia with the patient or authorized representative who has indicated his/her understanding and acceptance.     Dental advisory given  Plan Discussed with: CRNA  Anesthesia Plan Comments:         Anesthesia Quick Evaluation

## 2019-04-30 NOTE — Interval H&P Note (Signed)
History and Physical Interval Note:  04/30/2019 10:50 AM  Carolyn Vazquez  has presented today for surgery, with the diagnosis of afib.  The various methods of treatment have been discussed with the patient and family. After consideration of risks, benefits and other options for treatment, the patient has consented to  Procedure(s): ATRIAL FIBRILLATION ABLATION (N/A) as a surgical intervention.  The patient's history has been reviewed, patient examined, no change in status, stable for surgery.  I have reviewed the patient's chart and labs.  Questions were answered to the patient's satisfaction.     Risk, benefits, and alternatives to EP study and radiofrequency ablation for afib were discussed in detailagain today. These risks include but are not limited to stroke, bleeding, vascular damage, tamponade, perforation, damage to the esophagus, lungs, and other structures, pulmonary vein stenosis, worsening renal function, and death. The patient understands these risk and wishes to proceed.  She reports compliance with eliquis without interruption. She is in sinus rhythm today.  I therefore will evaluate her LAA with ICE rather than TEE.  Thompson Grayer MD, [facility] 04/30/2019 10:51 AM

## 2019-04-30 NOTE — Progress Notes (Addendum)
Rt groin tender to touch, level 0.   3-way stopcock removed , suture cut and stitches removed. No bleeding, continues to be very tender to touch, level 0. Gauze and tegaderm dressing applied. Instructions reviewed w/patient. IV saline locked.

## 2019-04-30 NOTE — Transfer of Care (Signed)
Immediate Anesthesia Transfer of Care Note  Patient: Carolyn Vazquez  Procedure(s) Performed: ATRIAL FIBRILLATION ABLATION (N/A )  Patient Location: Cath Lab  Anesthesia Type:General  Level of Consciousness: awake, alert  and patient cooperative  Airway & Oxygen Therapy: Patient Spontanous Breathing  Post-op Assessment: Report given to RN and Post -op Vital signs reviewed and stable  Post vital signs: Reviewed and stable  Last Vitals:  Vitals Value Taken Time  BP 142/52 04/30/19 1402  Temp    Pulse 60 04/30/19 1402  Resp 20 04/30/19 1402  SpO2 92 % 04/30/19 1402  Vitals shown include unvalidated device data.  Last Pain:  Vitals:   04/30/19 1402  TempSrc:   PainSc: 0-No pain         Complications: No apparent anesthesia complications

## 2019-04-30 NOTE — Progress Notes (Signed)
Up and walked and tolerated well; right groin stable no bleeding or hematoma 

## 2019-04-30 NOTE — Discharge Instructions (Signed)
Post procedure care instructions No driving for 4 days. No lifting over 5 lbs for 1 week. No vigorous or sexual activity for 1 week. You may return to work on 05/08/2019. Keep procedure site clean & dry. If you notice increased pain, swelling, bleeding or pus, call/return!  You may shower, but no soaking baths/hot tubs/pools for 1 week.    You have an appointment set up with the [facility].  Multiple studies have shown that being followed by a dedicated atrial fibrillation clinic in addition to the standard care you receive from your other physicians improves health. We believe that enrollment in the atrial fibrillation clinic will allow [place] to better care for you.   The phone number to the [facility] is [phone]. The clinic is staffed Monday through Friday from 8:30am to 5pm.  Parking Directions: The clinic is located in the Heart and Vascular Building connected to [facility]. 1)From [address] turn on to [address] and go to the 3rd entrance  (Heart and Vascular entrance) on the right. 2)Look to the right for Heart &Vascular Parking Garage. 3)A code for the entrance is required please call the clinic to receive this.   4)Take the elevators to the 1st floor. Registration is in the room with the glass walls at the end of the hallway.  If you have any trouble parking or locating the clinic, please dont hesitate to call [phone].

## 2019-04-30 NOTE — Anesthesia Postprocedure Evaluation (Signed)
Anesthesia Post Note  Patient: Carolyn Vazquez  Procedure(s) Performed: ATRIAL FIBRILLATION ABLATION (N/A )     Patient location during evaluation: Cath Lab Anesthesia Type: General Level of consciousness: awake and alert Pain management: pain level controlled Vital Signs Assessment: post-procedure vital signs reviewed and stable Respiratory status: spontaneous breathing, nonlabored ventilation and respiratory function stable Cardiovascular status: blood pressure returned to baseline and stable Postop Assessment: no apparent nausea or vomiting Anesthetic complications: no    Last Vitals:  Vitals:   04/30/19 1425 04/30/19 1429  BP: 134/63   Pulse: (!) 44   Resp: 19   Temp:  (!) 36.1 C  SpO2: 97%     Last Pain:  Vitals:   04/30/19 1429  TempSrc: Temporal  PainSc:                  Uma Jerde,W. EDMOND

## 2019-04-30 NOTE — Progress Notes (Signed)
Patient bled from rt femoral site. Pressure held for 10 minutes. Site level 0.

## 2019-04-30 NOTE — Anesthesia Procedure Notes (Signed)
Procedure Name: Intubation Date/Time: 04/30/2019 11:08 AM Performed by: Elayne Snare, CRNA Pre-anesthesia Checklist: Patient identified, Emergency Drugs available, Suction available and Patient being monitored Patient Re-evaluated:Patient Re-evaluated prior to induction Oxygen Delivery Method: Circle System Utilized Preoxygenation: Pre-oxygenation with 100% oxygen Induction Type: IV induction and Rapid sequence Laryngoscope Size: Mac and 3 Grade View: Grade II Tube type: Oral Tube size: 7.0 mm Number of attempts: 1 Airway Equipment and Method: Stylet Placement Confirmation: ETT inserted through vocal cords under direct vision,  positive ETCO2 and breath sounds checked- equal and bilateral Secured at: 22 cm Tube secured with: Tape Dental Injury: Teeth and Oropharynx as per pre-operative assessment

## 2019-05-01 ENCOUNTER — Encounter (HOSPITAL_COMMUNITY): Payer: Self-pay | Admitting: Internal Medicine

## 2019-05-01 MED FILL — Bupivacaine HCl Preservative Free (PF) Inj 0.25%: INTRAMUSCULAR | Qty: 30 | Status: AC

## 2019-05-01 NOTE — Progress Notes (Signed)
Patient stated she woke up with pain in leg but it went away.  Instructed patient to call Dr. Rayann Heman if reoccurs.

## 2019-05-27 ENCOUNTER — Other Ambulatory Visit: Payer: Self-pay

## 2019-05-27 ENCOUNTER — Encounter (HOSPITAL_COMMUNITY): Payer: Self-pay | Admitting: Nurse Practitioner

## 2019-05-27 ENCOUNTER — Ambulatory Visit (HOSPITAL_COMMUNITY)
Admission: RE | Admit: 2019-05-27 | Discharge: 2019-05-27 | Disposition: A | Discharge status: Home / Self Care | Payer: Medicare Other | Source: Ambulatory Visit | Attending: Physician Assistant | Admitting: Physician Assistant

## 2019-05-27 VITALS — BP 158/94 | HR 68 | Ht 61.0 in | Wt 160.0 lb

## 2019-05-27 DIAGNOSIS — Z825 Family history of asthma and other chronic lower respiratory diseases: Secondary | ICD-10-CM | POA: Insufficient documentation

## 2019-05-27 DIAGNOSIS — Z79899 Other long term (current) drug therapy: Secondary | ICD-10-CM | POA: Insufficient documentation

## 2019-05-27 DIAGNOSIS — I5032 Chronic diastolic (congestive) heart failure: Secondary | ICD-10-CM | POA: Diagnosis not present

## 2019-05-27 DIAGNOSIS — E039 Hypothyroidism, unspecified: Secondary | ICD-10-CM | POA: Insufficient documentation

## 2019-05-27 DIAGNOSIS — I110 Hypertensive heart disease with heart failure: Secondary | ICD-10-CM | POA: Insufficient documentation

## 2019-05-27 DIAGNOSIS — Z888 Allergy status to other drugs, medicaments and biological substances status: Secondary | ICD-10-CM | POA: Insufficient documentation

## 2019-05-27 DIAGNOSIS — Z8249 Family history of ischemic heart disease and other diseases of the circulatory system: Secondary | ICD-10-CM | POA: Diagnosis not present

## 2019-05-27 DIAGNOSIS — M3130 Wegener's granulomatosis without renal involvement: Secondary | ICD-10-CM | POA: Diagnosis not present

## 2019-05-27 DIAGNOSIS — Z885 Allergy status to narcotic agent status: Secondary | ICD-10-CM | POA: Insufficient documentation

## 2019-05-27 DIAGNOSIS — Z8673 Personal history of transient ischemic attack (TIA), and cerebral infarction without residual deficits: Secondary | ICD-10-CM | POA: Diagnosis not present

## 2019-05-27 DIAGNOSIS — I4819 Other persistent atrial fibrillation: Secondary | ICD-10-CM

## 2019-05-27 DIAGNOSIS — Z79890 Hormone replacement therapy (postmenopausal): Secondary | ICD-10-CM | POA: Insufficient documentation

## 2019-05-27 DIAGNOSIS — Z8349 Family history of other endocrine, nutritional and metabolic diseases: Secondary | ICD-10-CM | POA: Insufficient documentation

## 2019-05-27 DIAGNOSIS — Z880 Allergy status to penicillin: Secondary | ICD-10-CM | POA: Diagnosis not present

## 2019-05-27 DIAGNOSIS — E785 Hyperlipidemia, unspecified: Secondary | ICD-10-CM | POA: Diagnosis not present

## 2019-05-27 DIAGNOSIS — Z7901 Long term (current) use of anticoagulants: Secondary | ICD-10-CM | POA: Diagnosis not present

## 2019-05-27 DIAGNOSIS — Z833 Family history of diabetes mellitus: Secondary | ICD-10-CM | POA: Diagnosis not present

## 2019-05-27 DIAGNOSIS — Z823 Family history of stroke: Secondary | ICD-10-CM | POA: Diagnosis not present

## 2019-05-27 NOTE — Progress Notes (Addendum)
Primary Care Physician: [name], MD Referring Physician: Hansel Starling   Carolyn Vazquez is a 78 y.o. female with a h/o persistent afib that is in the afib clinic one month after second ablation. She is doing very well No issues with afib since procedure. No swallowing or groin issues.  Today, she denies symptoms of palpitations, chest pain, shortness of breath, orthopnea, PND, lower extremity edema, dizziness, presyncope, syncope, or neurologic sequela. The patient is tolerating medications without difficulties and is otherwise without complaint today.   Past Medical History:  Diagnosis Date  . Chronic diastolic CHF (congestive heart failure) ([place])   . Hyperlipidemia   . Hypertension   . Hypothyroidism   . Mild dilation of ascending aorta ([facility])   . Persistent atrial fibrillation    a. dx 06/2017.  . Stroke (cerebrum) ([place])   . 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 Wegener's granulomatosis ([place])    Past Surgical History:  Procedure Laterality Date  . ABDOMINAL HYSTERECTOMY  1979  . ATRIAL FIBRILLATION ABLATION N/A 07/08/2018   Procedure: ATRIAL FIBRILLATION ABLATION;  Surgeon: Thompson Grayer, MD;  Location: [place] [facility];  Service: Cardiovascular;  Laterality: N/A;  . ATRIAL FIBRILLATION ABLATION N/A 04/30/2019   Procedure: ATRIAL FIBRILLATION ABLATION;  Surgeon: Thompson Grayer, MD;  Location: [place] [facility];  Service: Cardiovascular;  Laterality: N/A;  . BUNIONECTOMY Right   . CARDIOVERSION N/A 08/05/2017   Procedure: CARDIOVERSION;  Surgeon: [name], MD;  Location: [facility] ENDOSCOPY;  Service: Cardiovascular;  Laterality: N/A;  . CARDIOVERSION N/A 09/20/2017   Procedure: CARDIOVERSION;  Surgeon: [name], MD;  Location: [facility] ENDOSCOPY;  Service: Cardiovascular;  Laterality: N/A;  . CARDIOVERSION N/A 08/21/2018   Procedure: CARDIOVERSION;  Surgeon: Thayer Headings, MD;  Location: [facility];  Service: Cardiovascular;  Laterality: N/A;  .  CHOLECYSTECTOMY    . 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 NASAL SINUS SURGERY      Current Outpatient Medications  Medication Sig Dispense Refill  . apixaban (ELIQUIS) 5 MG TABS tablet Take 1 tablet (5 mg total) by mouth 2 (two) times daily. 60 tablet 6  . B Complex Vitamins (B-COMPLEX/B-12) TABS Take 1 tablet by mouth daily.     . calcium elemental as carbonate (TUMS ULTRA 1000) 400 MG chewable tablet Chew 1,000 mg by mouth 4 (four) times daily as needed for heartburn.     . Cholecalciferol 2000 units CAPS Take 2,000 Units by mouth daily.     . Coenzyme Q10 (COQ-10) 200 MG CAPS Take 200 mg by mouth every Monday, Wednesday, and Friday at 8 PM.     . diltiazem (CARDIZEM CD) 120 MG 24 hr capsule Take 2 capsule in the AM and 1 capsule in the PM (Patient taking differently: Take 120-240 mg by mouth See admin instructions. Take 2 capsules (240 mg) by mouth in the morning & take 1 capsule (120 mg) by mouth in the evening.) 270 capsule 3  . escitalopram (LEXAPRO) 10 MG tablet Take 10 mg by mouth daily.     . fexofenadine (ALLEGRA) 180 MG tablet Take 180 mg by mouth daily.    . furosemide (LASIX) 40 MG tablet Take 1 tablet (40 mg total) by mouth daily. Please make annual appt with Dr. Marlou Porch for future refills. Thank you 90 tablet 0  . levothyroxine (SYNTHROID) 100 MCG tablet Take 1 tablet (100 mcg total) by mouth daily before breakfast.    . losartan (COZAAR) 25  MG tablet TAKE 1 TABLET BY MOUTH ONCE DAILY 90 tablet 3  . metoprolol succinate (TOPROL-XL) 25 MG 24 hr tablet Take 1 tablet by mouth twice daily 180 tablet 2  . Multiple Vitamins-Minerals (ICAPS AREDS FORMULA PO) Take 1 capsule by mouth 2 (two) times daily.    . pantoprazole (PROTONIX) 40 MG tablet Take 1 tablet (40 mg total) by mouth daily. 45 tablet 0  . potassium chloride (K-DUR) 10 MEQ tablet Take 1 tablet (10 mEq total) by mouth daily. 30 tablet 6  . rosuvastatin (CRESTOR) 5 MG tablet Take 1 tablet (5 mg total) by mouth every  Monday, Wednesday, and Friday. (Patient taking differently: Take 5 mg by mouth every Monday, Wednesday, and Friday at 8 PM. ) 120 tablet 2  . sodium chloride (OCEAN) 0.65 % SOLN nasal spray Place 1 spray into both nostrils as needed for congestion.    . SYSTANE ULTRA 0.4-0.3 % SOLN Place 1 drop into both eyes 5 (five) times daily as needed (dry/irritated eyes (scheduled in the morning & at night)).    . ABREVA 10 % CREA Apply 1 application topically 2 (two) times daily as needed (cold sores.).    [name] acetaminophen (TYLENOL) 500 MG tablet Take 1,000 mg by mouth 2 (two) times daily as needed for moderate pain or headache.    . naproxen sodium (ALEVE) 220 MG tablet Take 220 mg by mouth 2 (two) times daily as needed (pain.).     No current facility-administered medications for this encounter.     Allergies  Allergen Reactions  . Codeine Other (See Comments)    Patient isn't sure of reaction. Happens years ago - 71's  . Atorvastatin Other (See Comments)    Depression   . Penicillins Rash    Happened in 1960s Has patient had a PCN reaction causing immediate rash, facial/tongue/throat swelling, SOB or lightheadedness with hypotension: Unknown Has patient had a PCN reaction causing severe rash involving mucus membranes or skin necrosis: Unknown Has patient had a PCN reaction that required hospitalization: Unknown Has patient had a PCN reaction occurring within the last 10 years: No If all of the above answers are "NO", then may proceed with Cephalosporin use.   . Simvastatin Other (See Comments)    JOINT PAIN    Social History   Socioeconomic History  . Marital status: Widowed    Spouse name: Not on file  . Number of children: Not on file  . Years of education: Not on file  . Highest education level: Not on file  Occupational History  . Not on file  Social Needs  . Financial resource strain: Not on file  . Food insecurity    Worry: Not on file    Inability: Not on file  .  Transportation needs    Medical: Not on file    Non-medical: Not on file  Tobacco Use  . Smoking status: Never Smoker  . Smokeless tobacco: Never Used  Substance and Sexual Activity  . Alcohol use: Yes    Alcohol/week: 0.0 standard drinks    Comment: every day glass of wine  . Drug use: No  . Sexual activity: Not on file  Lifestyle  . Physical activity    Days per week: Not on file    Minutes per session: Not on file  . Stress: Not on file  Relationships  . Social Herbalist on phone: Not on file    Gets together: Not on file  Attends religious service: Not on file    Active member of club or organization: Not on file    Attends meetings of clubs or organizations: Not on file    Relationship status: Not on file  . Intimate partner violence    Fear of current or ex partner: Not on file    Emotionally abused: Not on file    Physically abused: Not on file    Forced sexual activity: Not on file  Other Topics Concern  . Not on file  Social History Narrative  . Not on file    Family History  Problem Relation Age of Onset  . Stroke Mother   . Thyroid disease Mother   . Stroke Father   . Alcoholism Father   . Asthma Sister   . Thyroid disease Sister   . Diabetes Brother   . Thyroid disease Sister   . Asthma Brother   . Heart attack Brother     ROS- All systems are reviewed and negative except as per the HPI above  Physical Exam: Vitals:   05/27/19 1345  BP: (!) 158/94  Pulse: 68  Weight: 72.6 kg  Height: 5\' 1"  (1.549 m)   Wt Readings from Last 3 Encounters:  05/27/19 72.6 kg  04/30/19 68 kg  04/17/19 68 kg    Labs: Lab Results  Component Value Date   NA 140 04/27/2019   K 4.1 04/27/2019   CL 98 04/27/2019   CO2 25 04/27/2019   GLUCOSE 138 (H) 04/27/2019   BUN 14 04/27/2019   CREATININE 0.93 04/27/2019   CALCIUM 9.4 04/27/2019   No results found for: INR Lab Results  Component Value Date   CHOL 192 07/07/2017   HDL 55 07/07/2017    LDLCALC 124 (H) 07/07/2017   TRIG 66 07/07/2017     GEN- The patient is well appearing, alert and oriented x 3 today.   Head- normocephalic, atraumatic Eyes-  Sclera clear, conjunctiva pink Ears- hearing intact Oropharynx- clear Neck- supple, no JVP Lymph- no cervical lymphadenopathy Lungs- Clear to ausculation bilaterally, normal work of breathing Heart- Regular rate and rhythm, no murmurs, rubs or gallops, PMI not laterally displaced GI- soft, NT, ND, + BS Extremities- no clubbing, cyanosis, or edema MS- no significant deformity or atrophy Skin- no rash or lesion Psych- euthymic mood, full affect Neuro- strength and sensation are intact  EKG-NSR at 68 bpm, pr int 144 ms, qrs int 88 ms, qtc 469 ms Epic records reviewed    Assessment and Plan: 1. Afib Doing well with no afib awareness  continue metoprolol 25 mg bid  No swallowing or groin issues  2. HTN Elevated here but at home stable  3. CHA2DS2VASc score of at least 5 Continue apixaban 5 mg bid  Reminded not to interrupt therapy during 3 month healing period  F/u with Dr. Rayann Heman as scheduled 9/14  Butch [name] C. Asiel Chrostowski, [facility] [address] [place], [place] [address] [phone]

## 2019-05-29 ENCOUNTER — Ambulatory Visit (HOSPITAL_COMMUNITY): Payer: Medicare Other | Admitting: Nurse Practitioner

## 2019-06-08 ENCOUNTER — Ambulatory Visit
Admission: RE | Admit: 2019-06-08 | Discharge: 2019-06-08 | Disposition: A | Discharge status: Home / Self Care | Payer: Medicare Other | Source: Ambulatory Visit | Attending: Family Medicine | Admitting: Family Medicine

## 2019-06-08 ENCOUNTER — Other Ambulatory Visit: Payer: Self-pay

## 2019-06-08 DIAGNOSIS — Z1231 Encounter for screening mammogram for malignant neoplasm of breast: Secondary | ICD-10-CM

## 2019-06-08 DIAGNOSIS — E2839 Other primary ovarian failure: Secondary | ICD-10-CM

## 2019-06-19 ENCOUNTER — Other Ambulatory Visit: Payer: Self-pay | Admitting: Family Medicine

## 2019-06-19 DIAGNOSIS — M79606 Pain in leg, unspecified: Secondary | ICD-10-CM

## 2019-06-26 ENCOUNTER — Other Ambulatory Visit: Payer: Medicare Other

## 2019-07-06 ENCOUNTER — Ambulatory Visit
Admission: RE | Admit: 2019-07-06 | Discharge: 2019-07-06 | Disposition: A | Discharge status: Home / Self Care | Payer: Medicare Other | Source: Ambulatory Visit | Attending: Family Medicine | Admitting: Family Medicine

## 2019-07-06 ENCOUNTER — Other Ambulatory Visit: Payer: Self-pay

## 2019-07-06 DIAGNOSIS — M79606 Pain in leg, unspecified: Secondary | ICD-10-CM

## 2019-07-16 ENCOUNTER — Telehealth (HOSPITAL_COMMUNITY): Payer: Self-pay | Admitting: *Deleted

## 2019-07-16 MED ORDER — METOPROLOL SUCCINATE ER 25 MG PO TB24: 12.5000 mg | ORAL_TABLET | Freq: Two times a day (BID) | ORAL | 2 refills | Status: DC

## 2019-07-16 NOTE — Telephone Encounter (Signed)
Patient called in stating she is fatigued from the time she gets up in the morning til she goes to bed despite sleeping well. She has little activity tolerance wonder if medicine related. Pt has monitored HR this week range from 55-67 BP 135-157/80s. Discussed with Roderic Palau NP will decrease metoprolol to 12.5mg  BID. Pt verbalized understanding.

## 2019-07-24 ENCOUNTER — Other Ambulatory Visit: Payer: Self-pay | Admitting: Cardiology

## 2019-07-30 ENCOUNTER — Telehealth: Payer: Self-pay

## 2019-07-30 NOTE — Telephone Encounter (Signed)
Left a message regarding appt on 08/03/19. 

## 2019-08-03 ENCOUNTER — Other Ambulatory Visit: Payer: Self-pay

## 2019-08-03 ENCOUNTER — Telehealth (INDEPENDENT_AMBULATORY_CARE_PROVIDER_SITE_OTHER): Payer: Medicare Other | Admitting: Internal Medicine

## 2019-08-03 ENCOUNTER — Encounter: Payer: Self-pay | Admitting: Internal Medicine

## 2019-08-03 VITALS — BP 135/84 | HR 75 | Ht 61.0 in | Wt 150.0 lb

## 2019-08-03 DIAGNOSIS — I10 Essential (primary) hypertension: Secondary | ICD-10-CM

## 2019-08-03 DIAGNOSIS — I4819 Other persistent atrial fibrillation: Secondary | ICD-10-CM | POA: Diagnosis not present

## 2019-08-03 NOTE — Progress Notes (Signed)
Electrophysiology TeleHealth Note  Due to national recommendations of social distancing due to [place] 19, an audio telehealth visit is felt to be most appropriate for this patient at this time.  Verbal consent was obtained by me for the telehealth visit today.  The patient does not have capability for a virtual visit.  A phone visit is therefore required today.   Date:  08/03/2019   ID:  Jace, Depietro Nov 18, 1941, MRN [id number]  Location: patient's home  Provider location:  [place]  Evaluation Performed: Follow-up visit  PCP:  [name], MD   Electrophysiologist:  Dr Rayann Heman  Chief Complaint:  palpitations  History of Present Illness:    JEIMMY MEWHINNEY is a 78 y.o. female who presents via telehealth conferencing today.  Since her recent afib ablation, the patient reports doing very well.  Her AF has been quiescent since ablation.  Today, she denies symptoms of palpitations, chest pain, shortness of breath,  lower extremity edema, dizziness, presyncope, or syncope.  The patient is otherwise without complaint today.  The patient denies symptoms of fevers, chills, cough, or new SOB worrisome for COVID 19.  Past Medical History:  Diagnosis Date  . Chronic diastolic CHF (congestive heart failure) ([place])   . Hyperlipidemia   . Hypertension   . Hypothyroidism   . Mild dilation of ascending aorta ([facility])   . Persistent atrial fibrillation    a. dx 06/2017.  . Stroke (cerebrum) ([place])   . 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 Wegener's granulomatosis ([place])     Past Surgical History:  Procedure Laterality Date  . ABDOMINAL HYSTERECTOMY  1979  . ATRIAL FIBRILLATION ABLATION N/A 07/08/2018   Procedure: ATRIAL FIBRILLATION ABLATION;  Surgeon: Thompson Grayer, MD;  Location: [place] [facility];  Service: Cardiovascular;  Laterality: N/A;  . ATRIAL FIBRILLATION ABLATION N/A 04/30/2019   Procedure: ATRIAL FIBRILLATION ABLATION;  Surgeon: Thompson Grayer, MD;   Location: [place] [facility];  Service: Cardiovascular;  Laterality: N/A;  . BUNIONECTOMY Right   . CARDIOVERSION N/A 08/05/2017   Procedure: CARDIOVERSION;  Surgeon: [name], MD;  Location: [facility] ENDOSCOPY;  Service: Cardiovascular;  Laterality: N/A;  . CARDIOVERSION N/A 09/20/2017   Procedure: CARDIOVERSION;  Surgeon: [name], MD;  Location: [facility] ENDOSCOPY;  Service: Cardiovascular;  Laterality: N/A;  . CARDIOVERSION N/A 08/21/2018   Procedure: CARDIOVERSION;  Surgeon: Thayer Headings, MD;  Location: [place];  Service: Cardiovascular;  Laterality: N/A;  . CHOLECYSTECTOMY    . 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 NASAL SINUS SURGERY      Current Outpatient Medications  Medication Sig Dispense Refill  . ABREVA 10 % CREA Apply 1 application topically 2 (two) times daily as needed (cold sores.).    [name] acetaminophen (TYLENOL) 500 MG tablet Take 1,000 mg by mouth 2 (two) times daily as needed for moderate pain or headache.    [name] apixaban (ELIQUIS) 5 MG TABS tablet Take 1 tablet (5 mg total) by mouth 2 (two) times daily. 60 tablet 6  . B Complex Vitamins (B-COMPLEX/B-12) TABS Take 1 tablet by mouth daily.     . calcium elemental as carbonate (TUMS ULTRA 1000) 400 MG chewable tablet Chew 1,000 mg by mouth 4 (four) times daily as needed for heartburn.     . Cholecalciferol 2000 units CAPS Take 2,000 Units by mouth daily.     . Coenzyme Q10 (COQ-10) 200 MG CAPS Take 200 mg by  mouth every Monday, Wednesday, and Friday at 8 PM.     . diltiazem (CARDIZEM CD) 120 MG 24 hr capsule Take 2 capsule in the AM and 1 capsule in the PM (Patient taking differently: Take 120-240 mg by mouth See admin instructions. Take 2 capsules (240 mg) by mouth in the morning & take 1 capsule (120 mg) by mouth in the evening.) 270 capsule 3  . escitalopram (LEXAPRO) 10 MG tablet Take 10 mg by mouth daily.     . fexofenadine (ALLEGRA) 180 MG tablet Take 180 mg by mouth daily.    . furosemide (LASIX) 40  MG tablet TAKE 1 TABLET BY MOUTH ONCE DAILY PLEASE  MAKE  ANNUAL  APPOINTMENT  WITH  DR.  Marlou Porch  FOR  FUTURE  REFILLS 90 tablet 2  . levothyroxine (SYNTHROID) 100 MCG tablet Take 1 tablet (100 mcg total) by mouth daily before breakfast.    . losartan (COZAAR) 25 MG tablet TAKE 1 TABLET BY MOUTH ONCE DAILY 90 tablet 3  . metoprolol succinate (TOPROL-XL) 25 MG 24 hr tablet Take 0.5 tablets (12.5 mg total) by mouth 2 (two) times daily. 180 tablet 2  . Multiple Vitamins-Minerals (ICAPS AREDS FORMULA PO) Take 1 capsule by mouth 2 (two) times daily.    . naproxen sodium (ALEVE) 220 MG tablet Take 220 mg by mouth 2 (two) times daily as needed (pain.).    [name] potassium chloride (K-DUR) 10 MEQ tablet Take 1 tablet (10 mEq total) by mouth daily. 30 tablet 6  . rosuvastatin (CRESTOR) 5 MG tablet Take 1 tablet (5 mg total) by mouth every Monday, Wednesday, and Friday. (Patient taking differently: Take 5 mg by mouth every Monday, Wednesday, and Friday at 8 PM. ) 120 tablet 2  . sodium chloride (OCEAN) 0.65 % SOLN nasal spray Place 1 spray into both nostrils as needed for congestion.    . SYSTANE ULTRA 0.4-0.3 % SOLN Place 1 drop into both eyes 5 (five) times daily as needed (dry/irritated eyes (scheduled in the morning & at night)).    . pantoprazole (PROTONIX) 40 MG tablet Take 1 tablet (40 mg total) by mouth daily. 45 tablet 0   No current facility-administered medications for this visit.     Allergies:   Codeine, Atorvastatin, Penicillins, and Simvastatin   Social History:  The patient  reports that she has never smoked. She has never used smokeless tobacco. She reports current alcohol use. She reports that she does not use drugs.   Family History:  The patient's  family history includes Alcoholism in her father; Asthma in her brother and sister; Diabetes in her brother; Heart attack in her brother; Stroke in her father and mother; Thyroid disease in her mother, sister, and sister.   ROS:  Please see the  history of present illness.   All other systems are personally reviewed and negative.    Exam:    Vital Signs:  BP 135/84   Pulse 75   Ht 5\' 1"  (1.549 m)   Wt 150 lb (68 kg)   LMP  (LMP Unknown)   BMI 28.34 kg/m   Well sounding, alert and conversant   Labs/Other Tests and Data Reviewed:    Recent Labs: 04/27/2019: BUN 14; Creatinine, Ser 0.93; Hemoglobin 13.9; Platelets 268; Potassium 4.1; Sodium 140   Wt Readings from Last 3 Encounters:  08/03/19 150 lb (68 kg)  05/27/19 160 lb (72.6 kg)  04/30/19 150 lb (68 kg)     ASSESSMENT & PLAN:  1.  Persistent afib/ left atrial flutter Well controlled post ablation off AAD therapy chads2vasc score is 5.  She is on eliquis Reduce toprol to 12.5mg  daily x 4 weeks and then stop it. Consider reducing diltiazem on return  2. HTN Stable No change required today  Follow-up:  3 months with AF clinic 6 months with me   Patient Risk:  after full review of this patients clinical status, I feel that they are at moderate risk at this time.  Today, I have spent 15 minutes with the patient with telehealth technology discussing arrhythmia management .    Army Fossa, MD  08/03/2019 12:46 PM     [address] [place] [address] [phone] (office) [phone] (fax)

## 2019-09-03 ENCOUNTER — Other Ambulatory Visit (HOSPITAL_COMMUNITY): Payer: Self-pay | Admitting: Nurse Practitioner

## 2019-10-01 ENCOUNTER — Telehealth (HOSPITAL_COMMUNITY): Payer: Self-pay | Admitting: *Deleted

## 2019-10-01 MED ORDER — METOPROLOL SUCCINATE ER 25 MG PO TB24: 12.5000 mg | ORAL_TABLET | Freq: Every day | ORAL | 2 refills | Status: DC

## 2019-10-01 NOTE — Telephone Encounter (Signed)
Patient came down with URI (neg for flu/covid) lingering cough. Has noticed uptick in HR to 80-90 up to 100 with exertion. Pt to add metoprolol back in 12.5mg  once a day at bedtime and see if this levels out her HRs as she doesn't feel as well when HR near 90. Pt has follow up in couple weeks will call if issues prior to then.

## 2019-10-03 IMAGING — MG DIGITAL SCREENING BILATERAL MAMMOGRAM WITH TOMO AND CAD
6 of 10 series · 6 of 30 positions shown · non-contrast
Comparison: Previous exam(s).

CLINICAL DATA: Screening.

EXAM:
DIGITAL SCREENING BILATERAL MAMMOGRAM WITH TOMO AND CAD

[R MLO synth-2D]
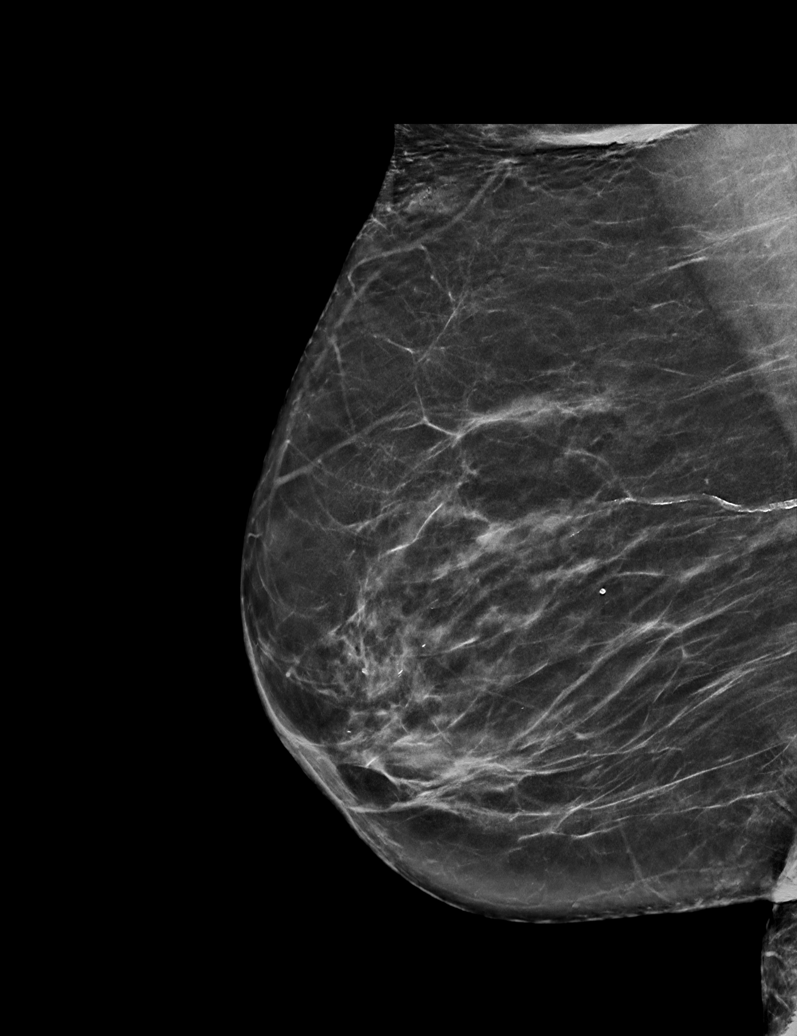

[L XCCL synth-2D]
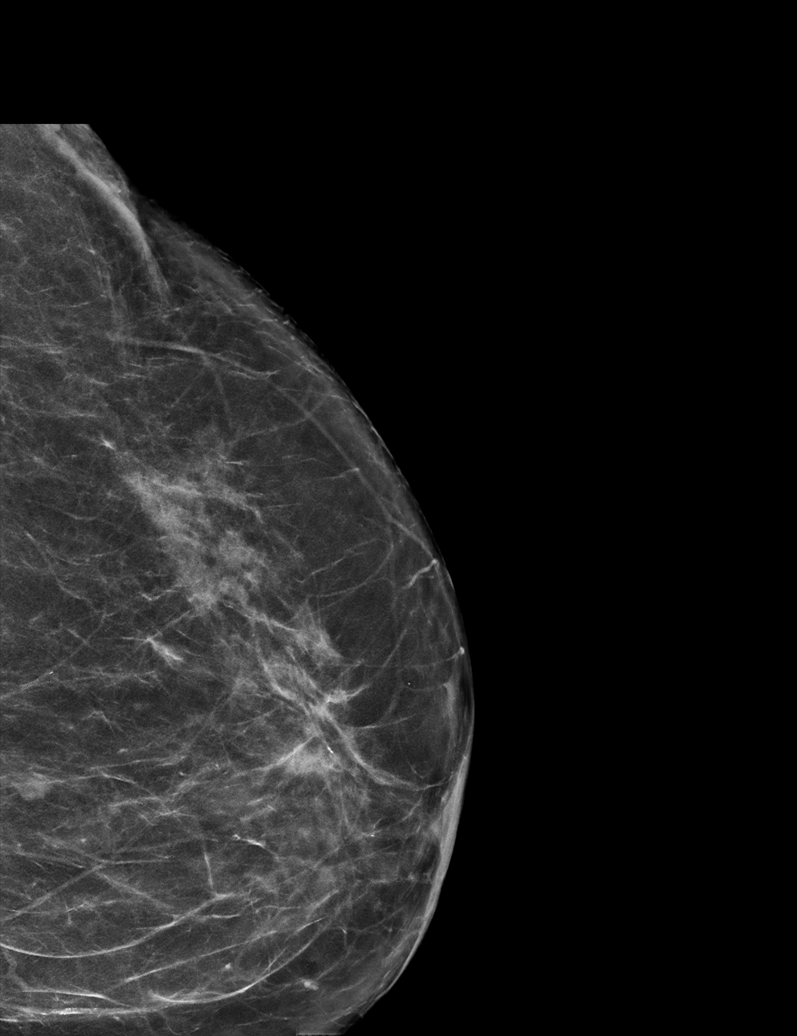

[R CC synth-2D]
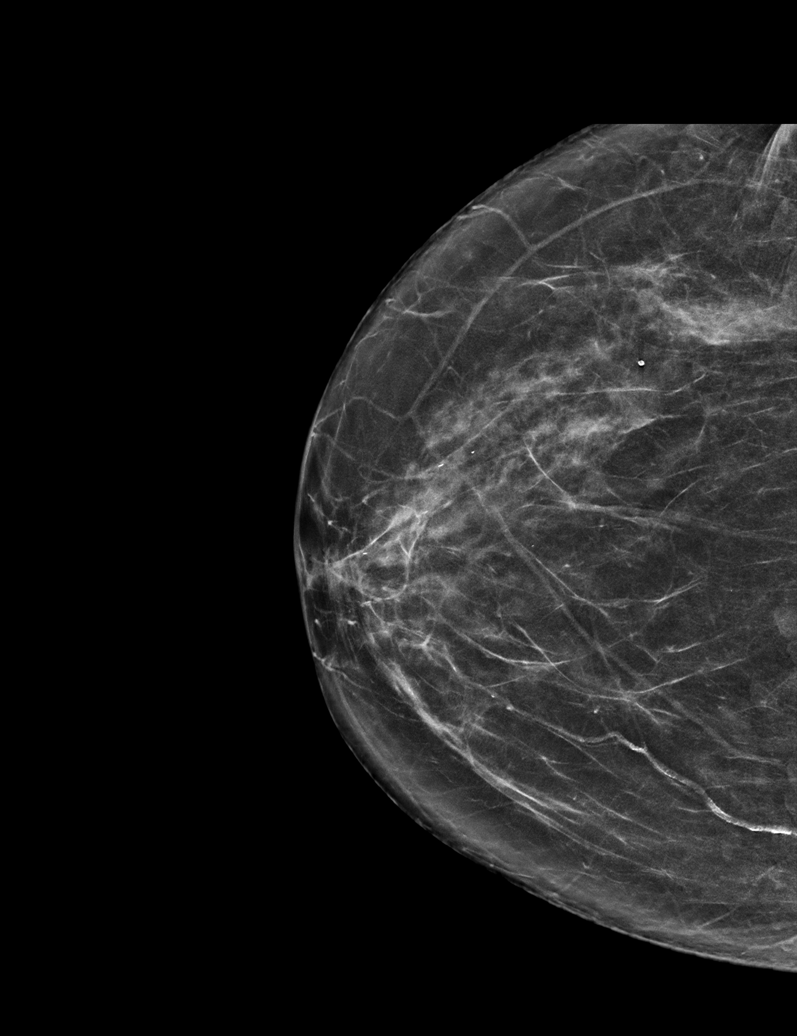

[L CC synth-2D]
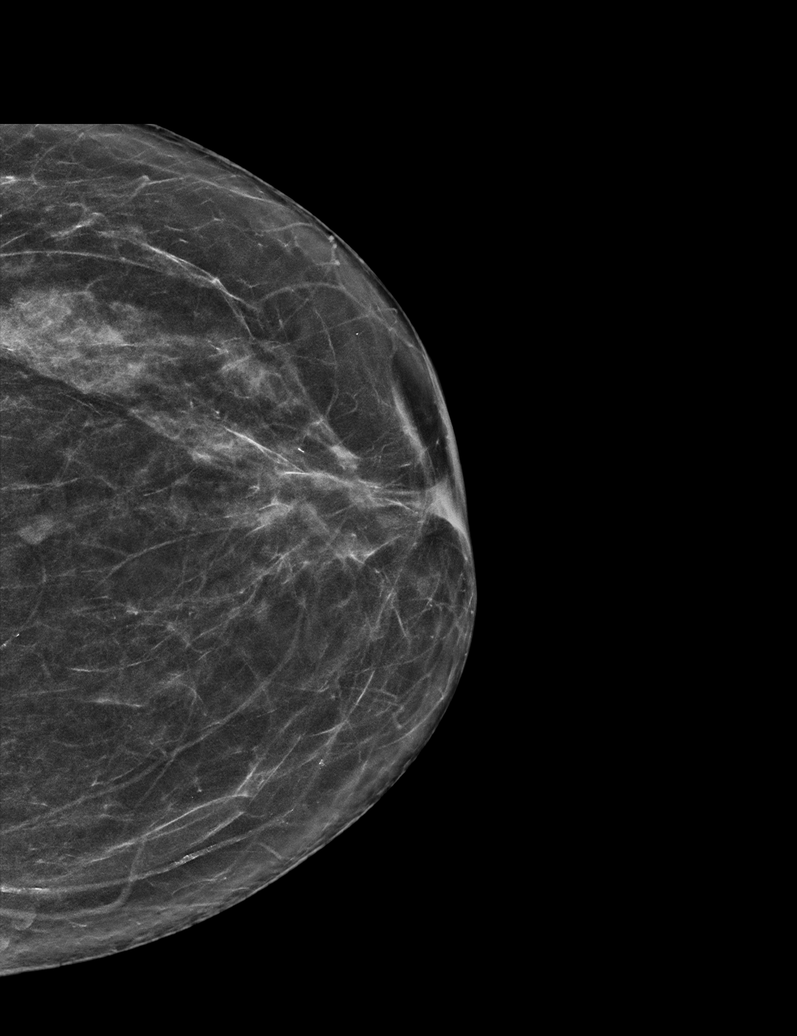

[L MLO synth-2D]
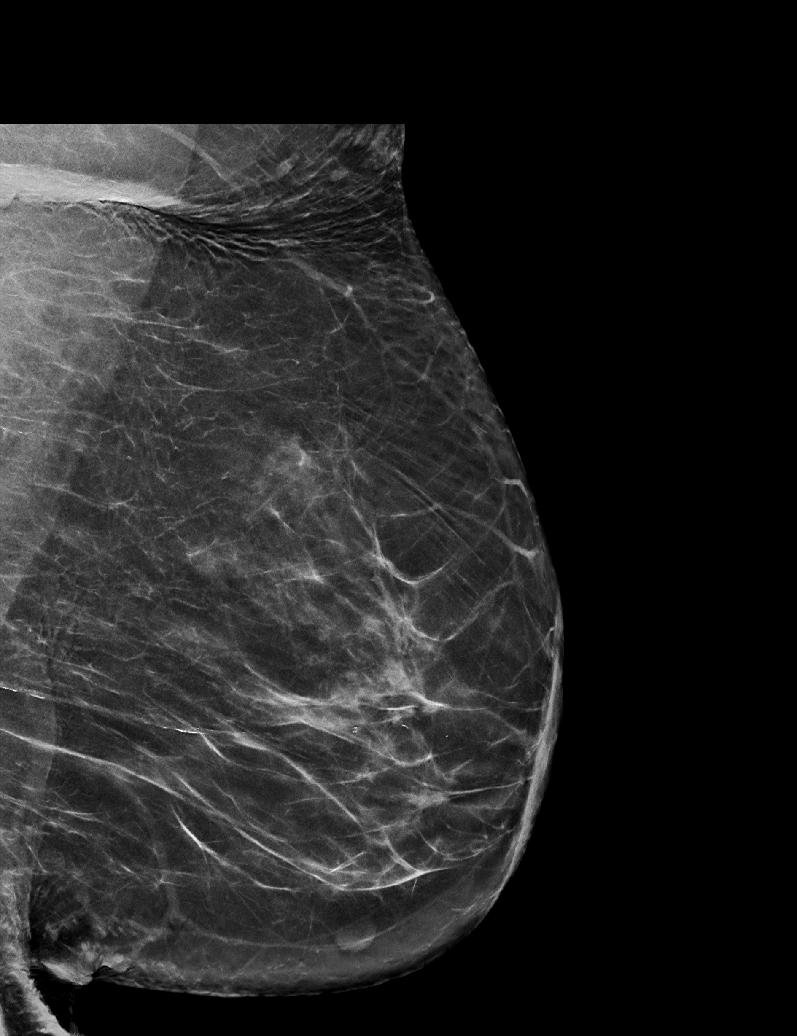

[R MLO tomo · tomo slice 35/68.0]
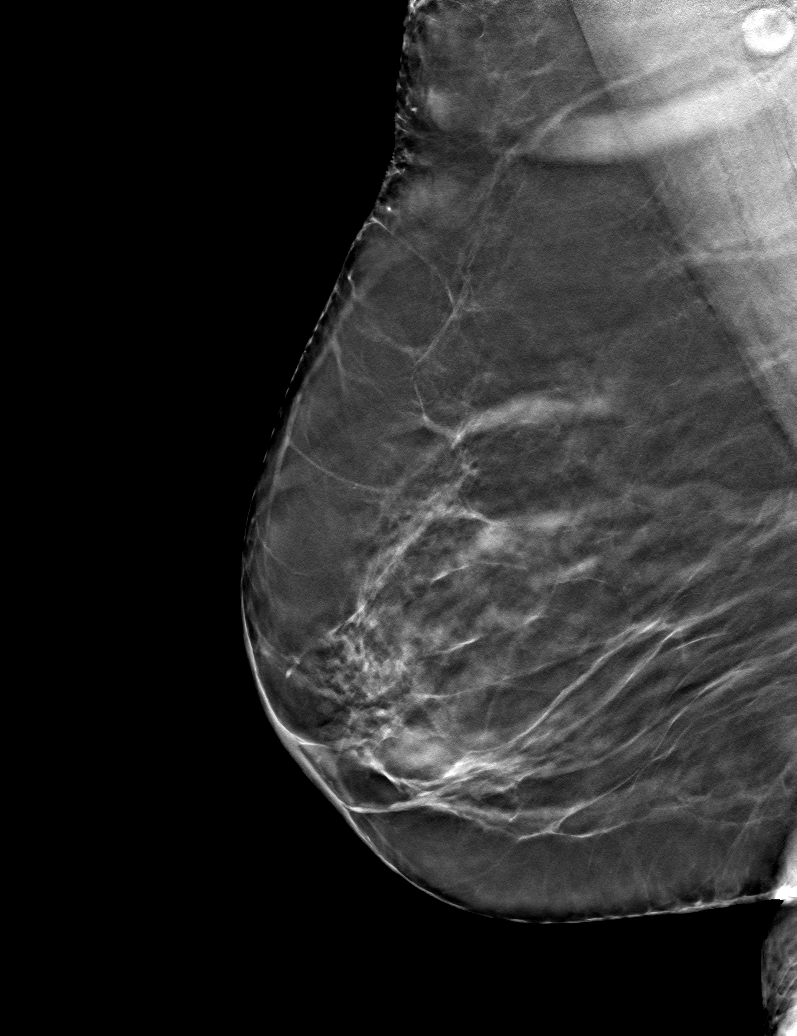

[6 of 30 positions shown; findings below may reference images not displayed]

ACR Breast Density Category b: There are scattered areas of
fibroglandular density.
FINDINGS: There are no findings suspicious for malignancy. Images were
processed with CAD.
IMPRESSION: No mammographic evidence of malignancy. A result letter of this
screening mammogram will be mailed directly to the patient.

RECOMMENDATION:
Screening mammogram in one year. (Code:[id number])

BI-RADS CATEGORY  1: Negative.

## 2019-10-27 ENCOUNTER — Encounter (HOSPITAL_COMMUNITY): Payer: Self-pay | Admitting: Nurse Practitioner

## 2019-10-27 ENCOUNTER — Ambulatory Visit (HOSPITAL_COMMUNITY)
Admission: RE | Admit: 2019-10-27 | Discharge: 2019-10-27 | Disposition: A | Discharge status: Home / Self Care | Payer: Medicare Other | Source: Ambulatory Visit | Attending: Nurse Practitioner | Admitting: Nurse Practitioner

## 2019-10-27 ENCOUNTER — Other Ambulatory Visit: Payer: Self-pay

## 2019-10-27 VITALS — BP 118/78 | HR 67 | Ht 61.0 in | Wt 158.6 lb

## 2019-10-27 DIAGNOSIS — D6869 Other thrombophilia: Secondary | ICD-10-CM | POA: Diagnosis not present

## 2019-10-27 DIAGNOSIS — Z888 Allergy status to other drugs, medicaments and biological substances status: Secondary | ICD-10-CM | POA: Insufficient documentation

## 2019-10-27 DIAGNOSIS — I4819 Other persistent atrial fibrillation: Secondary | ICD-10-CM | POA: Insufficient documentation

## 2019-10-27 DIAGNOSIS — Z880 Allergy status to penicillin: Secondary | ICD-10-CM | POA: Diagnosis not present

## 2019-10-27 DIAGNOSIS — Z79899 Other long term (current) drug therapy: Secondary | ICD-10-CM | POA: Insufficient documentation

## 2019-10-27 DIAGNOSIS — Z823 Family history of stroke: Secondary | ICD-10-CM | POA: Insufficient documentation

## 2019-10-27 DIAGNOSIS — Z885 Allergy status to narcotic agent status: Secondary | ICD-10-CM | POA: Insufficient documentation

## 2019-10-27 DIAGNOSIS — Z79890 Hormone replacement therapy (postmenopausal): Secondary | ICD-10-CM | POA: Diagnosis not present

## 2019-10-27 DIAGNOSIS — Z7901 Long term (current) use of anticoagulants: Secondary | ICD-10-CM | POA: Diagnosis not present

## 2019-10-27 DIAGNOSIS — I5032 Chronic diastolic (congestive) heart failure: Secondary | ICD-10-CM | POA: Insufficient documentation

## 2019-10-27 DIAGNOSIS — E039 Hypothyroidism, unspecified: Secondary | ICD-10-CM | POA: Diagnosis not present

## 2019-10-27 DIAGNOSIS — I110 Hypertensive heart disease with heart failure: Secondary | ICD-10-CM | POA: Insufficient documentation

## 2019-10-27 DIAGNOSIS — Z8673 Personal history of transient ischemic attack (TIA), and cerebral infarction without residual deficits: Secondary | ICD-10-CM | POA: Diagnosis not present

## 2019-10-27 DIAGNOSIS — E785 Hyperlipidemia, unspecified: Secondary | ICD-10-CM | POA: Diagnosis not present

## 2019-10-27 MED ORDER — DILTIAZEM HCL ER COATED BEADS 120 MG PO CP24: 120.0000 mg | ORAL_CAPSULE | Freq: Two times a day (BID) | ORAL | 3 refills | Status: DC

## 2019-10-27 NOTE — Progress Notes (Signed)
Primary Care Physician: [name], MD Referring Physician: Dr. [name] is a 78 y.o. female with a h/o afib with h/o ablations x 2, last  one in June 2020. She is enjoying SR. On last visit, Dr. Rayann Heman stopped BB but her HR picked up and she preferred it to be slower so she went back on 1/2 tab daily which smoothed out her HR. Today, she is c/o of fatigue and per Dr. Jackalyn Lombard note, could reduce  CCB at this visit. She would like to try this. CHA2DS2VASc of at least 6.   Today, she denies symptoms of palpitations, chest pain, shortness of breath, orthopnea, PND, lower extremity edema, dizziness, presyncope, syncope, or neurologic sequela. The patient is tolerating medications without difficulties and is otherwise without complaint today.   Past Medical History:  Diagnosis Date  . Chronic diastolic CHF (congestive heart failure) ([place])   . Hyperlipidemia   . Hypertension   . Hypothyroidism   . Mild dilation of ascending aorta (HCC)   . Persistent atrial fibrillation ([place])    a. dx 06/2017.  . Stroke (cerebrum) ([place])   . 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 Wegener's granulomatosis ([place])    Past Surgical History:  Procedure Laterality Date  . ABDOMINAL HYSTERECTOMY  1979  . ATRIAL FIBRILLATION ABLATION N/A 07/08/2018   Procedure: ATRIAL FIBRILLATION ABLATION;  Surgeon: Thompson Grayer, MD;  Location: [facility];  Service: Cardiovascular;  Laterality: N/A;  . ATRIAL FIBRILLATION ABLATION N/A 04/30/2019   Procedure: ATRIAL FIBRILLATION ABLATION;  Surgeon: Thompson Grayer, MD;  Location: [facility];  Service: Cardiovascular;  Laterality: N/A;  . BUNIONECTOMY Right   . CARDIOVERSION N/A 08/05/2017   Procedure: CARDIOVERSION;  Surgeon: [name], MD;  Location: [facility] ENDOSCOPY;  Service: Cardiovascular;  Laterality: N/A;  . CARDIOVERSION N/A 09/20/2017   Procedure: CARDIOVERSION;  Surgeon: [name], MD;  Location: [facility] ENDOSCOPY;   Service: Cardiovascular;  Laterality: N/A;  . CARDIOVERSION N/A 08/21/2018   Procedure: CARDIOVERSION;  Surgeon: Thayer Headings, MD;  Location: [facility];  Service: Cardiovascular;  Laterality: N/A;  . CHOLECYSTECTOMY    . 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 NASAL SINUS SURGERY      Current Outpatient Medications  Medication Sig Dispense Refill  . ABREVA 10 % CREA Apply 1 application topically 2 (two) times daily as needed (cold sores.).    [name] acetaminophen (TYLENOL) 500 MG tablet Take 1,000 mg by mouth 2 (two) times daily as needed for moderate pain or headache.    [name] apixaban (ELIQUIS) 5 MG TABS tablet Take 1 tablet (5 mg total) by mouth 2 (two) times daily. 60 tablet 6  . B Complex Vitamins (B-COMPLEX/B-12) TABS Take 1 tablet by mouth daily.     . calcium elemental as carbonate (TUMS ULTRA 1000) 400 MG chewable tablet Chew 1,000 mg by mouth 4 (four) times daily as needed for heartburn.     . Cholecalciferol 2000 units CAPS Take 2,000 Units by mouth daily.     . Coenzyme Q10 (COQ-10) 200 MG CAPS Take 200 mg by mouth every Monday, Wednesday, and Friday at 8 PM.     . diltiazem (CARDIZEM CD) 120 MG 24 hr capsule Take 1 capsule (120 mg total) by mouth 2 (two) times daily. 270 capsule 3  . escitalopram (LEXAPRO) 10 MG tablet Take 10 mg by mouth daily.     . fexofenadine (ALLEGRA) 180 MG tablet  Take 180 mg by mouth daily.    . furosemide (LASIX) 40 MG tablet TAKE 1 TABLET BY MOUTH ONCE DAILY PLEASE  MAKE  ANNUAL  APPOINTMENT  WITH  DR.  Marlou Porch  FOR  FUTURE  REFILLS 90 tablet 2  . levothyroxine (SYNTHROID) 100 MCG tablet Take 1 tablet (100 mcg total) by mouth daily before breakfast.    . losartan (COZAAR) 25 MG tablet TAKE 1 TABLET BY MOUTH ONCE DAILY 90 tablet 3  . metoprolol succinate (TOPROL-XL) 25 MG 24 hr tablet Take 0.5 tablets (12.5 mg total) by mouth at bedtime. 180 tablet 2  . Multiple Vitamins-Minerals (ICAPS AREDS FORMULA PO) Take 1 capsule by mouth 2 (two) times daily.    .  naproxen sodium (ALEVE) 220 MG tablet Take 220 mg by mouth 2 (two) times daily as needed (pain.).    [name] potassium chloride (K-DUR) 10 MEQ tablet Take 1 tablet (10 mEq total) by mouth daily. 30 tablet 6  . rosuvastatin (CRESTOR) 5 MG tablet Take 1 tablet (5 mg total) by mouth every Monday, Wednesday, and Friday. (Patient taking differently: Take 5 mg by mouth every Monday, Wednesday, and Friday at 8 PM. ) 120 tablet 2  . sodium chloride (OCEAN) 0.65 % SOLN nasal spray Place 1 spray into both nostrils as needed for congestion.    . SYSTANE ULTRA 0.4-0.3 % SOLN Place 1 drop into both eyes 5 (five) times daily as needed (dry/irritated eyes (scheduled in the morning & at night)).    . pantoprazole (PROTONIX) 40 MG tablet Take 1 tablet (40 mg total) by mouth daily. 45 tablet 0   No current facility-administered medications for this encounter.     Allergies  Allergen Reactions  . Codeine Other (See Comments)    Patient isn't sure of reaction. Happens years ago - 95's  . Atorvastatin Other (See Comments)    Depression   . Penicillins Rash    Happened in 1960s Has patient had a PCN reaction causing immediate rash, facial/tongue/throat swelling, SOB or lightheadedness with hypotension: Unknown Has patient had a PCN reaction causing severe rash involving mucus membranes or skin necrosis: Unknown Has patient had a PCN reaction that required hospitalization: Unknown Has patient had a PCN reaction occurring within the last 10 years: No If all of the above answers are "NO", then may proceed with Cephalosporin use.   . Simvastatin Other (See Comments)    JOINT PAIN    Social History   Socioeconomic History  . Marital status: Widowed    Spouse name: Not on file  . Number of children: Not on file  . Years of education: Not on file  . Highest education level: Not on file  Occupational History  . Not on file  Social Needs  . Financial resource strain: Not on file  . Food insecurity    Worry:  Not on file    Inability: Not on file  . Transportation needs    Medical: Not on file    Non-medical: Not on file  Tobacco Use  . Smoking status: Never Smoker  . Smokeless tobacco: Never Used  Substance and Sexual Activity  . Alcohol use: Yes    Alcohol/week: 1.0 standard drinks    Types: 1 Glasses of wine per week    Comment: every day glass of wine  . Drug use: No  . Sexual activity: Not on file  Lifestyle  . Physical activity    Days per week: Not on file    Minutes  per session: Not on file  . Stress: Not on file  Relationships  . Social Herbalist on phone: Not on file    Gets together: Not on file    Attends religious service: Not on file    Active member of club or organization: Not on file    Attends meetings of clubs or organizations: Not on file    Relationship status: Not on file  . Intimate partner violence    Fear of current or ex partner: Not on file    Emotionally abused: Not on file    Physically abused: Not on file    Forced sexual activity: Not on file  Other Topics Concern  . Not on file  Social History Narrative  . Not on file    Family History  Problem Relation Age of Onset  . Stroke Mother   . Thyroid disease Mother   . Stroke Father   . Alcoholism Father   . Asthma Sister   . Thyroid disease Sister   . Diabetes Brother   . Thyroid disease Sister   . Asthma Brother   . Heart attack Brother     ROS- All systems are reviewed and negative except as per the HPI above  Physical Exam: Vitals:   10/27/19 1413  BP: 118/78  Pulse: 67  Weight: 71.9 kg  Height: 5\' 1"  (1.549 m)   Wt Readings from Last 3 Encounters:  10/27/19 71.9 kg  08/03/19 68 kg  05/27/19 72.6 kg    Labs: Lab Results  Component Value Date   NA 140 04/27/2019   K 4.1 04/27/2019   CL 98 04/27/2019   CO2 25 04/27/2019   GLUCOSE 138 (H) 04/27/2019   BUN 14 04/27/2019   CREATININE 0.93 04/27/2019   CALCIUM 9.4 04/27/2019   No results found for: INR Lab  Results  Component Value Date   CHOL 192 07/07/2017   HDL 55 07/07/2017   LDLCALC 124 (H) 07/07/2017   TRIG 66 07/07/2017     GEN- The patient is well appearing, alert and oriented x 3 today.   Head- normocephalic, atraumatic Eyes-  Sclera clear, conjunctiva pink Ears- hearing intact Oropharynx- clear Neck- supple, no JVP Lymph- no cervical lymphadenopathy Lungs- Clear to ausculation bilaterally, normal work of breathing Heart- Regular rate and rhythm, no murmurs, rubs or gallops, PMI not laterally displaced GI- soft, NT, ND, + BS Extremities- no clubbing, cyanosis, or edema MS- no significant deformity or atrophy Skin- no rash or lesion Psych- euthymic mood, full affect Neuro- strength and sensation are intact  EKG-SR with short PR, PR int 110 bpm, qrs int 88 ms, qtc 441 ms    Assessment and Plan: 1. Persisitent afib Doing well after her second ablation last June maintaining  SR She tried to come off BB but did not like that  her HR was feeling like it was running faster She went back on 1/2 tab of 25 mg metoprolol succinate and wants to stay on this  Will now try to reduce Cardizem to 120 mg bid from  240 mg am and 120 mg pm   2. CHA2DS2VASc score of at least 6 Continue eliquis 5 mg bid   F/u with Dr. Rayann Heman as scheduled 02/01/20 afib clinic as needed  Butch [name] C. Carroll, [facility] [address] [place], [place] [address] [phone]

## 2019-10-27 NOTE — Patient Instructions (Signed)
Cardizem 120mg  twice a day

## 2019-12-19 ENCOUNTER — Other Ambulatory Visit (HOSPITAL_COMMUNITY): Payer: Self-pay | Admitting: Cardiology

## 2019-12-28 ENCOUNTER — Other Ambulatory Visit (HOSPITAL_COMMUNITY): Payer: Self-pay | Admitting: Cardiology

## 2019-12-28 ENCOUNTER — Other Ambulatory Visit (HOSPITAL_COMMUNITY): Payer: Self-pay | Admitting: Nurse Practitioner

## 2019-12-28 ENCOUNTER — Other Ambulatory Visit: Payer: Self-pay | Admitting: Internal Medicine

## 2020-01-24 ENCOUNTER — Other Ambulatory Visit (HOSPITAL_COMMUNITY): Payer: Self-pay | Admitting: Cardiology

## 2020-02-01 ENCOUNTER — Telehealth (INDEPENDENT_AMBULATORY_CARE_PROVIDER_SITE_OTHER): Payer: Medicare Other | Admitting: Internal Medicine

## 2020-02-01 ENCOUNTER — Other Ambulatory Visit: Payer: Self-pay

## 2020-02-01 DIAGNOSIS — I10 Essential (primary) hypertension: Secondary | ICD-10-CM

## 2020-02-01 DIAGNOSIS — I4819 Other persistent atrial fibrillation: Secondary | ICD-10-CM

## 2020-02-01 DIAGNOSIS — D6869 Other thrombophilia: Secondary | ICD-10-CM

## 2020-02-01 NOTE — Progress Notes (Signed)
Electrophysiology TeleHealth Note  Due to national recommendations of social distancing due to [place] 19, an audio telehealth visit is felt to be most appropriate for this patient at this time.  Verbal consent was obtained by me for the telehealth visit today.  The patient does not have capability for a virtual visit.  A phone visit is therefore required today.   Date:  02/01/2020   ID:  Carolyn Vazquez [id number], MRN [id number]  Location: patient's home  Provider location:  [place]  Evaluation Performed: Follow-up visit  PCP:  [name], MD   Electrophysiologist:  Dr Rayann Heman  Chief Complaint:  palpitations  History of Present Illness:    Carolyn Vazquez is a 79 y.o. female who presents via telehealth conferencing today.  Since last being seen in our clinic, the patient reports doing reasonably well.  Over the past week, she has developed worsening SOB.  She worries that she may be back in afib.  She has noticed mild palpitations.Today, she denies symptoms of exertional chest pain, dizziness, presyncope, or syncope. She has edema in her R leg intermittently.  The patient is otherwise without complaint today.   Past Medical History:  Diagnosis Date  . Chronic diastolic CHF (congestive heart failure) ([place])   . Hyperlipidemia   . Hypertension   . Hypothyroidism   . Mild dilation of ascending aorta (HCC)   . Persistent atrial fibrillation ([place])    a. dx 06/2017.  . Stroke (cerebrum) ([place])   . 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 Wegener's granulomatosis ([place])     Past Surgical History:  Procedure Laterality Date  . ABDOMINAL HYSTERECTOMY  1979  . ATRIAL FIBRILLATION ABLATION N/A 07/08/2018   Procedure: ATRIAL FIBRILLATION ABLATION;  Surgeon: Thompson Grayer, MD;  Location: [facility];  Service: Cardiovascular;  Laterality: N/A;  . ATRIAL FIBRILLATION ABLATION N/A 04/30/2019   Procedure: ATRIAL FIBRILLATION ABLATION;  Surgeon: Thompson Grayer, MD;  Location: [facility];  Service: Cardiovascular;  Laterality: N/A;  . BUNIONECTOMY Right   . CARDIOVERSION N/A 08/05/2017   Procedure: CARDIOVERSION;  Surgeon: [name], MD;  Location: [facility] ENDOSCOPY;  Service: Cardiovascular;  Laterality: N/A;  . CARDIOVERSION N/A 09/20/2017   Procedure: CARDIOVERSION;  Surgeon: [name], MD;  Location: [facility] ENDOSCOPY;  Service: Cardiovascular;  Laterality: N/A;  . CARDIOVERSION N/A 08/21/2018   Procedure: CARDIOVERSION;  Surgeon: Thayer Headings, MD;  Location: [facility];  Service: Cardiovascular;  Laterality: N/A;  . CHOLECYSTECTOMY    . 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 NASAL SINUS SURGERY      Current Outpatient Medications  Medication Sig Dispense Refill  . ABREVA 10 % CREA Apply 1 application topically 2 (two) times daily as needed (cold sores.).    [name] acetaminophen (TYLENOL) 500 MG tablet Take 1,000 mg by mouth 2 (two) times daily as needed for moderate pain or headache.    [name] apixaban (ELIQUIS) 5 MG TABS tablet Take 1 tablet (5 mg total) by mouth 2 (two) times daily. 60 tablet 6  . B Complex Vitamins (B-COMPLEX/B-12) TABS Take 1 tablet by mouth daily.     . calcium elemental as carbonate (TUMS ULTRA 1000) 400 MG chewable tablet Chew 1,000 mg by mouth 4 (four) times daily as needed for heartburn.     . Cholecalciferol 2000 units CAPS Take 2,000 Units by mouth daily.     . Coenzyme Q10 (COQ-10) 200 MG  CAPS Take 200 mg by mouth every Monday, Wednesday, and Friday at 8 PM.     . diltiazem (CARDIZEM CD) 120 MG 24 hr capsule Take 1 capsule (120 mg total) by mouth 2 (two) times daily. 270 capsule 3  . escitalopram (LEXAPRO) 10 MG tablet Take 10 mg by mouth daily.     . fexofenadine (ALLEGRA) 180 MG tablet Take 180 mg by mouth daily.    . furosemide (LASIX) 40 MG tablet TAKE 1 TABLET BY MOUTH ONCE DAILY PLEASE  MAKE  ANNUAL  APPOINTMENT  WITH  DR.  Marlou Porch  FOR  FUTURE  REFILLS 90 tablet 2  . levothyroxine  (SYNTHROID) 100 MCG tablet Take 1 tablet (100 mcg total) by mouth daily before breakfast.    . losartan (COZAAR) 25 MG tablet Take 1 tablet (25 mg total) by mouth daily. Please schedule annual appt with Dr.Skains for refills. [phone]. 2nd attempt. 15 tablet 0  . metoprolol succinate (TOPROL-XL) 25 MG 24 hr tablet Take 0.5 tablets (12.5 mg total) by mouth at bedtime. 180 tablet 2  . Multiple Vitamins-Minerals (ICAPS AREDS FORMULA PO) Take 1 capsule by mouth 2 (two) times daily.    . naproxen sodium (ALEVE) 220 MG tablet Take 220 mg by mouth 2 (two) times daily as needed (pain.).    [name] pantoprazole (PROTONIX) 40 MG tablet Take 1 tablet (40 mg total) by mouth daily. 45 tablet 0  . potassium chloride (K-DUR) 10 MEQ tablet Take 1 tablet (10 mEq total) by mouth daily. 30 tablet 6  . potassium chloride (KLOR-CON) 10 MEQ tablet Take 1 tablet by mouth once daily 90 tablet 2  . rosuvastatin (CRESTOR) 5 MG tablet Take 1 tablet (5 mg total) by mouth every Monday, Wednesday, and Friday. (Patient taking differently: Take 5 mg by mouth every Monday, Wednesday, and Friday at 8 PM. ) 120 tablet 2  . sodium chloride (OCEAN) 0.65 % SOLN nasal spray Place 1 spray into both nostrils as needed for congestion.    . SYSTANE ULTRA 0.4-0.3 % SOLN Place 1 drop into both eyes 5 (five) times daily as needed (dry/irritated eyes (scheduled in the morning & at night)).     No current facility-administered medications for this visit.    Allergies:   Codeine, Atorvastatin, Penicillins, and Simvastatin   Social History:  The patient  reports that she has never smoked. She has never used smokeless tobacco. She reports current alcohol use of about 1.0 standard drinks of alcohol per week. She reports that she does not use drugs.   Family History:  The patient's family history includes Alcoholism in her father; Asthma in her brother and sister; Diabetes in her brother; Heart attack in her brother; Stroke in her father and mother;  Thyroid disease in her mother, sister, and sister.   ROS:  Please see the history of present illness.   All other systems are personally reviewed and negative.    Exam:    Vital Signs:  LMP  (LMP Unknown)   Well sounding, alert and conversant   Labs/Other Tests and Data Reviewed:    Recent Labs: 04/27/2019: BUN 14; Creatinine, Ser 0.93; Hemoglobin 13.9; Platelets 268; Potassium 4.1; Sodium 140   Wt Readings from Last 3 Encounters:  10/27/19 158 lb 9.6 oz (71.9 kg)  08/03/19 150 lb (68 kg)  05/27/19 160 lb (72.6 kg)      ASSESSMENT & PLAN:    1.  Persistent atrial fibrillation/ left atrial flutter Doing well post ablation off AAD  therapy She thinks that she may now be back in afib. chads2vasc score is 6.  She is on eliquis I will have her come to the AF clinic this week for an ekg.  If she is in afib, I would advise cardioversion.  Ultimately, we could consider ILR for long term monitoring.  2. HTN Stable No change required today  Follow-up:  With me in 3 months if in sinus on AF clinic follow-up.   Patient Risk:  after full review of this patients clinical status, I feel that they are at moderate risk at this time.  Today, I have spent 15 minutes with the patient with telehealth technology discussing arrhythmia management .    Army Fossa, MD  02/01/2020 10:29 AM     [address] [place] [address] [phone] (office) [phone] (fax)

## 2020-02-02 ENCOUNTER — Other Ambulatory Visit: Payer: Self-pay

## 2020-02-02 ENCOUNTER — Ambulatory Visit (HOSPITAL_COMMUNITY)
Admission: RE | Admit: 2020-02-02 | Discharge: 2020-02-02 | Disposition: A | Discharge status: Home / Self Care | Payer: Medicare Other | Source: Ambulatory Visit | Attending: Nurse Practitioner | Admitting: Nurse Practitioner

## 2020-02-02 VITALS — BP 120/82 | HR 70 | Ht 61.0 in | Wt 157.0 lb

## 2020-02-02 DIAGNOSIS — R5383 Other fatigue: Secondary | ICD-10-CM

## 2020-02-02 DIAGNOSIS — I4891 Unspecified atrial fibrillation: Secondary | ICD-10-CM | POA: Insufficient documentation

## 2020-02-02 NOTE — Progress Notes (Signed)
Pt was here for EKG after having a televisit with Dr. Rayann Heman yesterday and having some fatigue,  how she feels in afib. She  is in SR today at 70 bpm, pr int 136 ms, qrs int 84, qtc 423 ms. The other difference is that she had a cortisone shot in her knee last Friday, maybe that had a systemic effect or maybe enen  threw her into afib afterwards. She was reassured that she was in SR today. She reports being very sedentary and encouraged her to build a walking routine. She will f/u with Dr. Rayann Heman  in 3 months.

## 2020-02-13 ENCOUNTER — Other Ambulatory Visit: Payer: Self-pay | Admitting: Internal Medicine

## 2020-02-28 ENCOUNTER — Other Ambulatory Visit (HOSPITAL_COMMUNITY): Payer: Self-pay | Admitting: Cardiology

## 2020-02-28 ENCOUNTER — Other Ambulatory Visit (HOSPITAL_COMMUNITY): Payer: Self-pay | Admitting: Nurse Practitioner

## 2020-03-01 MED ORDER — DILTIAZEM HCL ER COATED BEADS 120 MG PO CP24: 120.0000 mg | ORAL_CAPSULE | Freq: Two times a day (BID) | ORAL | 3 refills | Status: DC

## 2020-03-22 ENCOUNTER — Other Ambulatory Visit (HOSPITAL_COMMUNITY): Payer: Self-pay | Admitting: Nurse Practitioner

## 2020-03-30 ENCOUNTER — Other Ambulatory Visit: Payer: Self-pay | Admitting: Cardiology

## 2020-05-01 ENCOUNTER — Other Ambulatory Visit: Payer: Self-pay | Admitting: Cardiology

## 2020-05-12 ENCOUNTER — Telehealth: Payer: Self-pay

## 2020-05-12 NOTE — Telephone Encounter (Signed)
Left a message regarding virtual appt on 05/16/20.

## 2020-05-16 ENCOUNTER — Encounter: Payer: Self-pay | Admitting: Internal Medicine

## 2020-05-16 ENCOUNTER — Telehealth: Payer: Self-pay

## 2020-05-16 ENCOUNTER — Telehealth (INDEPENDENT_AMBULATORY_CARE_PROVIDER_SITE_OTHER): Payer: Medicare Other | Admitting: Internal Medicine

## 2020-05-16 ENCOUNTER — Other Ambulatory Visit: Payer: Self-pay

## 2020-05-16 VITALS — BP 135/85 | HR 76 | Ht 61.0 in | Wt 155.0 lb

## 2020-05-16 DIAGNOSIS — I10 Essential (primary) hypertension: Secondary | ICD-10-CM | POA: Diagnosis not present

## 2020-05-16 DIAGNOSIS — I4819 Other persistent atrial fibrillation: Secondary | ICD-10-CM

## 2020-05-16 DIAGNOSIS — D6869 Other thrombophilia: Secondary | ICD-10-CM | POA: Diagnosis not present

## 2020-05-16 NOTE — Telephone Encounter (Signed)
-----   Message from Thompson Grayer, MD sent at 05/16/2020 11:57 AM EDT ----- Stop toprol (She is aware)  AF clinic 3 months I will see him in 6 months  Dr Marlou Porch in 9 months

## 2020-05-16 NOTE — Telephone Encounter (Signed)
Recalls entered for Dr. Rayann Heman and Dr. Marlou Porch.  Will send to afib clinic to schedule 3 mo ov f/u.

## 2020-05-16 NOTE — Progress Notes (Signed)
Electrophysiology TeleHealth Note  Due to national recommendations of social distancing due to [place] 19, an audio telehealth visit is felt to be most appropriate for this patient at this time.  Verbal consent was obtained by me for the telehealth visit today.  The patient does not have capability for a virtual visit.  A phone visit is therefore required today.   Date:  05/16/2020   ID:  Zyara, Riling 19-Dec-1940, MRN [id number]  Location: patient's home  Provider location:  [place]  Evaluation Performed: Follow-up visit  PCP:  [name], MD   Electrophysiologist:  Dr Rayann Heman  Chief Complaint:  palpitations  History of Present Illness:    Carolyn Vazquez is a 79 y.o. female who presents via telehealth conferencing today.  Since last being seen in our clinic, the patient reports doing very well.  Today, she denies symptoms of palpitations, chest pain, shortness of breath,  lower extremity edema, dizziness, presyncope, or syncope.  The patient is otherwise without complaint today.     Past Medical History:  Diagnosis Date  . Chronic diastolic CHF (congestive heart failure) ([place])   . Hyperlipidemia   . Hypertension   . Hypothyroidism   . Mild dilation of ascending aorta (HCC)   . Persistent atrial fibrillation ([place])    a. dx 06/2017.  . Stroke (cerebrum) ([place])   . 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 Wegener's granulomatosis ([place])     Past Surgical History:  Procedure Laterality Date  . ABDOMINAL HYSTERECTOMY  1979  . ATRIAL FIBRILLATION ABLATION N/A 07/08/2018   Procedure: ATRIAL FIBRILLATION ABLATION;  Surgeon: Thompson Grayer, MD;  Location: [place] [facility];  Service: Cardiovascular;  Laterality: N/A;  . ATRIAL FIBRILLATION ABLATION N/A 04/30/2019   Procedure: ATRIAL FIBRILLATION ABLATION;  Surgeon: Thompson Grayer, MD;  Location: [place] [facility];  Service: Cardiovascular;  Laterality: N/A;  . BUNIONECTOMY Right   . CARDIOVERSION N/A  08/05/2017   Procedure: CARDIOVERSION;  Surgeon: [name], MD;  Location: [facility] ENDOSCOPY;  Service: Cardiovascular;  Laterality: N/A;  . CARDIOVERSION N/A 09/20/2017   Procedure: CARDIOVERSION;  Surgeon: [name], MD;  [facility] ENDOSCOPY;  Service: Cardiovascular;  Laterality: N/A;  . CARDIOVERSION N/A 08/21/2018   Procedure: CARDIOVERSION;  Surgeon: Thayer Headings, MD;  Location: [place];  Service: Cardiovascular;  Laterality: N/A;  . CHOLECYSTECTOMY    . 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 NASAL SINUS SURGERY      Current Outpatient Medications  Medication Sig Dispense Refill  . ABREVA 10 % CREA Apply 1 application topically 2 (two) times daily as needed (cold sores.).    [name] acetaminophen (TYLENOL) 500 MG tablet Take 1,000 mg by mouth 2 (two) times daily as needed for moderate pain or headache.    . B Complex Vitamins (B-COMPLEX/B-12) TABS Take 1 tablet by mouth daily.     . calcium elemental as carbonate (TUMS ULTRA 1000) 400 MG chewable tablet Chew 1,000 mg by mouth as needed for heartburn.     . Coenzyme Q10 (COQ-10) 200 MG CAPS Take 200 mg by mouth daily.     [name] diltiazem (CARDIZEM CD) 120 MG 24 hr capsule Take 1 capsule (120 mg total) by mouth 2 (two) times daily. 180 capsule 3  . ELIQUIS 5 MG TABS tablet Take 1 tablet by mouth twice daily 180 tablet 2  . escitalopram (LEXAPRO) 10 MG tablet Take 10 mg by mouth daily.     [name]  fexofenadine (ALLEGRA) 180 MG tablet Take 180 mg by mouth daily.    . furosemide (LASIX) 40 MG tablet Take 1 tablet (40 mg total) by mouth daily. 90 tablet 2  . levothyroxine (SYNTHROID) 112 MCG tablet Take 112 mcg by mouth every morning.    [name] losartan (COZAAR) 25 MG tablet Take 1 tablet (25 mg total) by mouth daily. 90 tablet 3  . metoprolol succinate (TOPROL-XL) 25 MG 24 hr tablet Take 0.5 tablets (12.5 mg total) by mouth at bedtime. 180 tablet 2  . Multiple Vitamins-Minerals (ICAPS AREDS FORMULA PO) Take 1 capsule by mouth 2 (two)  times daily.    . Polyvinyl Alcohol-Povidone (REFRESH OP) Place 1 drop into both eyes daily.    . potassium chloride (KLOR-CON) 10 MEQ tablet Take 1 tablet by mouth once daily 90 tablet 2  . rosuvastatin (CRESTOR) 5 MG tablet TAKE 1 TABLET BY MOUTH EVERY MONDAY, WEDNESDAY, AND FRIDAY 45 tablet 3  . sodium chloride (OCEAN) 0.65 % SOLN nasal spray Place 1 spray into both nostrils as needed for congestion.    . Vitamin D, Cholecalciferol, 50 MCG (2000 UT) CAPS Take 1 capsule by mouth daily.     No current facility-administered medications for this visit.    Allergies:   Codeine, Atorvastatin, Penicillins, and Simvastatin   Social History:  The patient  reports that she has never smoked. She has never used smokeless tobacco. She reports current alcohol use of about 1.0 standard drink of alcohol per week. She reports that she does not use drugs.   ROS:  Please see the history of present illness.   All other systems are personally reviewed and negative.    Exam:    Vital Signs:  BP 135/85   Pulse 76   Ht 5\' 1"  (1.549 m)   Wt 155 lb (70.3 kg)   LMP  (LMP Unknown)   BMI 29.29 kg/m   Well sounding, alert and conversant   Labs/Other Tests and Data Reviewed:    Recent Labs: No results found for requested labs within last 8760 hours.   Wt Readings from Last 3 Encounters:  05/16/20 155 lb (70.3 kg)  02/02/20 157 lb (71.2 kg)  10/27/19 158 lb 9.6 oz (71.9 kg)       ASSESSMENT & PLAN:    1.  Persistent atrial fibrillation/ left atrial flutter Doing very well post ablation off AAD therapy chads2vasc score is 6.  She is on eliquis She thought she had afib in march however ekg in the AF clinic showed sinus rhythm She has had no symptoms since that time Stop toprol which may be causing fatigue. Consider reducing diltiazem in the future Consider echo on follow-up visit in the AF clinic  2. HTN Stable No change required today  3. HL Continue crestor   Risks, benefits and  potential toxicities for medications prescribed and/or refilled reviewed with patient today.   Follow-up:  3 months in AF clinic, 6 months with me Follow-up with Dr Marlou Porch in 9 months   Patient Risk:  after full review of this patients clinical status, I feel that they are at moderate risk at this time.  Today, I have spent 15 minutes with the patient with telehealth technology discussing arrhythmia management .    Army Fossa, MD  05/16/2020 11:53 AM     [facility] [address] [place] [address] [phone] (office) [phone] (fax)

## 2020-06-01 ENCOUNTER — Other Ambulatory Visit: Payer: Self-pay | Admitting: Orthopedic Surgery

## 2020-06-01 DIAGNOSIS — M545 Low back pain, unspecified: Secondary | ICD-10-CM

## 2020-06-06 ENCOUNTER — Other Ambulatory Visit (HOSPITAL_COMMUNITY): Payer: Self-pay

## 2020-06-06 MED ORDER — METOPROLOL SUCCINATE ER 25 MG PO TB24: ORAL_TABLET | ORAL | 3 refills | Status: DC

## 2020-06-06 NOTE — Telephone Encounter (Signed)
Carolyn Vazquez wants to go back on BB since stopping BB in June at Dr. Jackalyn Lombard recommendation 2/2 fatigue. She can restart  Toprol 12.5 mg at hs.

## 2020-06-26 ENCOUNTER — Other Ambulatory Visit: Payer: Self-pay

## 2020-06-26 ENCOUNTER — Ambulatory Visit
Admission: RE | Admit: 2020-06-26 | Discharge: 2020-06-26 | Disposition: A | Discharge status: Home / Self Care | Payer: Medicare Other | Source: Ambulatory Visit | Attending: Orthopedic Surgery | Admitting: Orthopedic Surgery

## 2020-06-26 DIAGNOSIS — M545 Low back pain, unspecified: Secondary | ICD-10-CM

## 2020-06-29 ENCOUNTER — Telehealth: Payer: Self-pay

## 2020-06-29 NOTE — Telephone Encounter (Signed)
Anesthesia-LOCAL-epidural  Procedure to be completed by Dr. Doreatha Martin.

## 2020-06-29 NOTE — Telephone Encounter (Signed)
   [facility] Pre-operative Risk Assessment    HEARTCARE STAFF: - Please ensure there is not already an duplicate clearance open for this procedure. - Under Visit Info/Reason for Call, type in Other and utilize the format Clearance MM/DD/YY or Clearance TBD. Do not use dashes or single digits. - If request is for dental extraction, please clarify the # of teeth to be extracted.  Request for surgical clearance:  1. What type of surgery is being performed? Right L4-5 TF ESI   2. When is this surgery scheduled? TBD   3. What type of clearance is required (medical clearance vs. Pharmacy clearance to hold med vs. Both)? Pharmacy to hold med Eliquis.   4. Are there any medications that need to be held prior to surgery and how long?Eliquis prior to [facility]   5. Practice name and name of physician performing surgery? [facility] Dr. Laroy Apple  6. What is the office phone number? [phone]   7.   What is the office fax number? [phone]  8.   Anesthesia type (None, local, MAC, general) ? Called dr to clarify. Left message awaiting call back. [place] 06/29/2020, 3:22 PM  _________________________________________________________________   (provider comments below)

## 2020-06-30 NOTE — Telephone Encounter (Signed)
Patient with diagnosis of afib on Eliquis for anticoagulation.    Procedure: ESI Date of procedure: TBD  CHADS2-VASc score of 20 (age x2, sex, CHF, HTN, stroke in August 2018 - afib detected at that time)  CrCl 39mL/min Platelet count 289K  Typically hold Eliquis for 3 days prior to spinal injection. Due to elevated CV risk including history of stroke, will defer to MD for input on length of anticoag hold.

## 2020-06-30 NOTE — Telephone Encounter (Signed)
Pharmacy please comment on anticoagulation in this patient prior to epidural steroid injection.  Kerin Ransom PA-C 06/30/2020 8:33 AM

## 2020-07-05 NOTE — Telephone Encounter (Signed)
Note faxed to surgeon's office. Note will be removed from [place], [place]  2:20 PM 07/05/2020

## 2020-07-05 NOTE — Telephone Encounter (Signed)
Hold eliquis 3 days prior to the procedure and resume as soon as possible afterwards if the procedure is medically necessary.

## 2020-08-17 ENCOUNTER — Encounter (HOSPITAL_COMMUNITY): Payer: Self-pay | Admitting: Nurse Practitioner

## 2020-08-17 ENCOUNTER — Other Ambulatory Visit: Payer: Self-pay

## 2020-08-17 ENCOUNTER — Ambulatory Visit (HOSPITAL_COMMUNITY)
Admission: RE | Admit: 2020-08-17 | Discharge: 2020-08-17 | Disposition: A | Discharge status: Home / Self Care | Payer: Medicare Other | Source: Ambulatory Visit | Attending: Nurse Practitioner | Admitting: Nurse Practitioner

## 2020-08-17 VITALS — BP 132/82 | HR 67 | Ht 61.0 in | Wt 156.4 lb

## 2020-08-17 DIAGNOSIS — Z8249 Family history of ischemic heart disease and other diseases of the circulatory system: Secondary | ICD-10-CM | POA: Diagnosis not present

## 2020-08-17 DIAGNOSIS — I5032 Chronic diastolic (congestive) heart failure: Secondary | ICD-10-CM | POA: Insufficient documentation

## 2020-08-17 DIAGNOSIS — E039 Hypothyroidism, unspecified: Secondary | ICD-10-CM | POA: Insufficient documentation

## 2020-08-17 DIAGNOSIS — Z885 Allergy status to narcotic agent status: Secondary | ICD-10-CM | POA: Insufficient documentation

## 2020-08-17 DIAGNOSIS — Z880 Allergy status to penicillin: Secondary | ICD-10-CM | POA: Diagnosis not present

## 2020-08-17 DIAGNOSIS — D6869 Other thrombophilia: Secondary | ICD-10-CM | POA: Diagnosis not present

## 2020-08-17 DIAGNOSIS — E785 Hyperlipidemia, unspecified: Secondary | ICD-10-CM | POA: Insufficient documentation

## 2020-08-17 DIAGNOSIS — Z7901 Long term (current) use of anticoagulants: Secondary | ICD-10-CM | POA: Diagnosis not present

## 2020-08-17 DIAGNOSIS — Z8673 Personal history of transient ischemic attack (TIA), and cerebral infarction without residual deficits: Secondary | ICD-10-CM | POA: Insufficient documentation

## 2020-08-17 DIAGNOSIS — Z79890 Hormone replacement therapy (postmenopausal): Secondary | ICD-10-CM | POA: Insufficient documentation

## 2020-08-17 DIAGNOSIS — I110 Hypertensive heart disease with heart failure: Secondary | ICD-10-CM | POA: Diagnosis not present

## 2020-08-17 DIAGNOSIS — I4819 Other persistent atrial fibrillation: Secondary | ICD-10-CM | POA: Diagnosis present

## 2020-08-17 DIAGNOSIS — Z823 Family history of stroke: Secondary | ICD-10-CM | POA: Insufficient documentation

## 2020-08-17 DIAGNOSIS — Z888 Allergy status to other drugs, medicaments and biological substances status: Secondary | ICD-10-CM | POA: Diagnosis not present

## 2020-08-17 DIAGNOSIS — Z79899 Other long term (current) drug therapy: Secondary | ICD-10-CM | POA: Diagnosis not present

## 2020-08-17 NOTE — Progress Notes (Signed)
Primary Care Physician: [name], MD Referring Physician: Dr. [name] is a 79 y.o. female with a h/o afib with h/o ablations x 2, last  one in June 2020. She is enjoying SR  CHA2DS2VASc of at least 6.   Today, she denies symptoms of palpitations, chest pain, shortness of breath, orthopnea, PND, lower extremity edema, dizziness, presyncope, syncope, or neurologic sequela. The patient is tolerating medications without difficulties and is otherwise without complaint today.   Past Medical History:  Diagnosis Date  . Chronic diastolic CHF (congestive heart failure) ([place])   . Hyperlipidemia   . Hypertension   . Hypothyroidism   . Mild dilation of ascending aorta (HCC)   . Persistent atrial fibrillation ([place])    a. dx 06/2017.  . Stroke (cerebrum) ([place])   . 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)   . 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 ABDOMINAL HYSTERECTOMY  1979  . ATRIAL FIBRILLATION ABLATION N/A 07/08/2018   Procedure: ATRIAL FIBRILLATION ABLATION;  Surgeon: Thompson Grayer, MD;  Location: [place] [facility];  Service: Cardiovascular;  Laterality: N/A;  . ATRIAL FIBRILLATION ABLATION N/A 04/30/2019   Procedure: ATRIAL FIBRILLATION ABLATION;  Surgeon: Thompson Grayer, MD;  Location: [place] [facility];  Service: Cardiovascular;  Laterality: N/A;  . BUNIONECTOMY Right   . CARDIOVERSION N/A 08/05/2017   Procedure: CARDIOVERSION;  Surgeon: [name], MD;  Location: [facility] ENDOSCOPY;  Service: Cardiovascular;  Laterality: N/A;  . CARDIOVERSION N/A 09/20/2017   Procedure: CARDIOVERSION;  Surgeon: [name], MD;  Location: [facility] ENDOSCOPY;  Service: Cardiovascular;  Laterality: N/A;  . CARDIOVERSION N/A 08/21/2018   Procedure: CARDIOVERSION;  Surgeon: Thayer Headings, MD;  Location: [place];  Service: Cardiovascular;  Laterality: N/A;  . CHOLECYSTECTOMY    . LUNG SURGERY  2000's   both vasculitis    . NASAL SINUS SURGERY      Current Outpatient Medications  Medication Sig Dispense Refill  . ABREVA 10 % CREA Apply 1 application topically 2 (two) times daily as needed (cold sores.).    [name] acetaminophen (TYLENOL) 500 MG tablet Take 1,000 mg by mouth 2 (two) times daily as needed for moderate pain or headache.    . B Complex Vitamins (B-COMPLEX/B-12) TABS Take 1 tablet by mouth daily.     . calcium elemental as carbonate (TUMS ULTRA 1000) 400 MG chewable tablet Chew 1,000 mg by mouth as needed for heartburn.     . Coenzyme Q10 (COQ-10) 200 MG CAPS Take 200 mg by mouth 3 (three) times a week.     . diltiazem (CARDIZEM CD) 120 MG 24 hr capsule Take 1 capsule (120 mg total) by mouth 2 (two) times daily. 180 capsule 3  . ELIQUIS 5 MG TABS tablet Take 1 tablet by mouth twice daily 180 tablet 2  . escitalopram (LEXAPRO) 10 MG tablet Take 10 mg by mouth daily.     . fexofenadine (ALLEGRA) 180 MG tablet Take 180 mg by mouth daily.    . furosemide (LASIX) 40 MG tablet Take 1 tablet (40 mg total) by mouth daily. 90 tablet 2  . levothyroxine (SYNTHROID) 112 MCG tablet Take 112 mcg by mouth every morning.    [name] losartan (COZAAR) 25 MG tablet Take 1 tablet (25 mg total) by mouth daily. 90 tablet 3  . metoprolol succinate (TOPROL XL) 25 MG 24 hr tablet Take 1/2 tablet by mouth at bedtime 45 tablet  3  . Multiple Vitamins-Minerals (ICAPS AREDS FORMULA PO) Take 1 capsule by mouth 2 (two) times daily.    . Polyvinyl Alcohol-Povidone (REFRESH OP) Place 1 drop into both eyes as needed.     . potassium chloride (KLOR-CON) 10 MEQ tablet Take 1 tablet by mouth once daily 90 tablet 2  . rosuvastatin (CRESTOR) 5 MG tablet TAKE 1 TABLET BY MOUTH EVERY MONDAY, WEDNESDAY, AND FRIDAY 45 tablet 3  . sodium chloride (OCEAN) 0.65 % SOLN nasal spray Place 1 spray into both nostrils as needed for congestion.    . Vitamin D, Cholecalciferol, 50 MCG (2000 UT) CAPS Take 1 capsule by mouth daily.     No current  facility-administered medications for this encounter.    Allergies  Allergen Reactions  . Codeine Other (See Comments)    Patient isn't sure of reaction. Happens years ago - 32's  . Atorvastatin Other (See Comments)    Depression   . Penicillins Rash    Happened in 1960s Has patient had a PCN reaction causing immediate rash, facial/tongue/throat swelling, SOB or lightheadedness with hypotension: Unknown Has patient had a PCN reaction causing severe rash involving mucus membranes or skin necrosis: Unknown Has patient had a PCN reaction that required hospitalization: Unknown Has patient had a PCN reaction occurring within the last 10 years: No If all of the above answers are "NO", then may proceed with Cephalosporin use.   . Simvastatin Other (See Comments)    JOINT PAIN    Social History   Socioeconomic History  . Marital status: Widowed    Spouse name: Not on file  . Number of children: Not on file  . Years of education: Not on file  . Highest education level: Not on file  Occupational History  . Not on file  Tobacco Use  . Smoking status: Never Smoker  . Smokeless tobacco: Never Used  Vaping Use  . Vaping Use: Never used  Substance and Sexual Activity  . Alcohol use: Yes    Alcohol/week: 1.0 standard drink    Types: 1 Glasses of wine per week    Comment: every day glass of wine  . Drug use: No  . Sexual activity: Not on file  Other Topics Concern  . Not on file  Social History Narrative  . Not on file   Social Determinants of Health   Financial Resource Strain:   . Difficulty of Paying Living Expenses: Not on file  Food Insecurity:   . Worried About Charity fundraiser in the Last Year: Not on file  . Ran Out of Food in the Last Year: Not on file  Transportation Needs:   . Lack of Transportation (Medical): Not on file  . Lack of Transportation (Non-Medical): Not on file  Physical Activity:   . Days of Exercise per Week: Not on file  . Minutes of  Exercise per Session: Not on file  Stress:   . Feeling of Stress : Not on file  Social Connections:   . Frequency of Communication with Friends and Family: Not on file  . Frequency of Social Gatherings with Friends and Family: Not on file  . Attends Religious Services: Not on file  . Active Member of Clubs or Organizations: Not on file  . Attends Archivist Meetings: Not on file  . Marital Status: Not on file  Intimate Partner Violence:   . Fear of Current or Ex-Partner: Not on file  . Emotionally Abused: Not on file  .  Physically Abused: Not on file  . Sexually Abused: Not on file    Family History  Problem Relation Age of Onset  . Stroke Mother   . Thyroid disease Mother   . Stroke Father   . Alcoholism Father   . Asthma Sister   . Thyroid disease Sister   . Diabetes Brother   . Thyroid disease Sister   . Asthma Brother   . Heart attack Brother     ROS- All systems are reviewed and negative except as per the HPI above  Physical Exam: Vitals:   08/17/20 1004  BP: 132/82  Pulse: 67  Weight: 70.9 kg  Height: 5\' 1"  (1.549 m)   Wt Readings from Last 3 Encounters:  08/17/20 70.9 kg  05/16/20 70.3 kg  02/02/20 71.2 kg    Labs: Lab Results  Component Value Date   NA 140 04/27/2019   K 4.1 04/27/2019   CL 98 04/27/2019   CO2 25 04/27/2019   GLUCOSE 138 (H) 04/27/2019   BUN 14 04/27/2019   CREATININE 0.93 04/27/2019   CALCIUM 9.4 04/27/2019   No results found for: INR Lab Results  Component Value Date   CHOL 192 07/07/2017   HDL 55 07/07/2017   LDLCALC 124 (H) 07/07/2017   TRIG 66 07/07/2017     GEN- The patient is well appearing, alert and oriented x 3 today.   Head- normocephalic, atraumatic Eyes-  Sclera clear, conjunctiva pink Ears- hearing intact Oropharynx- clear Neck- supple, no JVP Lymph- no cervical lymphadenopathy Lungs- Clear to ausculation bilaterally, normal work of breathing Heart- Regular rate and rhythm, no murmurs, rubs  or gallops, PMI not laterally displaced GI- soft, NT, ND, + BS Extremities- no clubbing, cyanosis, or edema MS- no significant deformity or atrophy Skin- no rash or lesion Psych- euthymic mood, full affect Neuro- strength and sensation are intact  EKG-SR vrs junctional rhythm at 67 bpm, PR int 126 ms, qrs int 86 ms, qtc 448 ms   Assessment and Plan: 1. Persisitent afib Doing well after her second ablation last June maintaining  SR Continue on 1/2 tab of 25 mg metoprolol succinate at hs Continue 120 mg cardizem qd  2. CHA2DS2VASc score of at least 6 Continue eliquis 5 mg bid   F/u with afib clinic q 6 months   Butch [name] C. Juandaniel Manfredo, [facility] [address] [place], [place] [address] [phone]

## 2020-10-03 ENCOUNTER — Other Ambulatory Visit (HOSPITAL_COMMUNITY): Payer: Self-pay | Admitting: Internal Medicine

## 2020-10-04 MED ORDER — POTASSIUM CHLORIDE CRYS ER 10 MEQ PO TBCR: 10.0000 meq | EXTENDED_RELEASE_TABLET | Freq: Every day | ORAL | 2 refills | Status: DC

## 2020-11-21 ENCOUNTER — Ambulatory Visit: Payer: Medicare Other | Admitting: Internal Medicine

## 2020-11-21 ENCOUNTER — Telehealth: Payer: Self-pay | Admitting: Internal Medicine

## 2020-11-21 NOTE — Telephone Encounter (Signed)
New message    *STAT* If patient is at the pharmacy, call can be transferred to refill team.   1. Which medications need to be refilled? (please list name of each medication and dose if known) Potassium Chloride 10 meq  2. Which pharmacy/location (including street and city if local pharmacy) is medication to be sent to? [facility] on [address]  3. Do they need a 30 day or 90 day supply? 90 day

## 2020-11-23 DIAGNOSIS — H353221 Exudative age-related macular degeneration, left eye, with active choroidal neovascularization: Secondary | ICD-10-CM | POA: Diagnosis not present

## 2020-11-28 ENCOUNTER — Other Ambulatory Visit (HOSPITAL_COMMUNITY): Payer: Self-pay | Admitting: *Deleted

## 2020-11-29 MED ORDER — POTASSIUM CHLORIDE CRYS ER 10 MEQ PO TBCR
10.0000 meq | EXTENDED_RELEASE_TABLET | Freq: Two times a day (BID) | ORAL | 2 refills | Status: DC
Start: 2020-11-29 — End: 2021-08-15

## 2020-11-30 DIAGNOSIS — L820 Inflamed seborrheic keratosis: Secondary | ICD-10-CM | POA: Diagnosis not present

## 2020-11-30 DIAGNOSIS — D1721 Benign lipomatous neoplasm of skin and subcutaneous tissue of right arm: Secondary | ICD-10-CM | POA: Diagnosis not present

## 2020-12-14 ENCOUNTER — Ambulatory Visit: Payer: Medicare Other | Admitting: Internal Medicine

## 2020-12-14 ENCOUNTER — Encounter (INDEPENDENT_AMBULATORY_CARE_PROVIDER_SITE_OTHER): Payer: Self-pay

## 2020-12-14 ENCOUNTER — Other Ambulatory Visit: Payer: Self-pay

## 2020-12-14 VITALS — BP 132/78 | HR 73 | Ht 61.0 in | Wt 154.0 lb

## 2020-12-14 DIAGNOSIS — I10 Essential (primary) hypertension: Secondary | ICD-10-CM

## 2020-12-14 DIAGNOSIS — I4819 Other persistent atrial fibrillation: Secondary | ICD-10-CM

## 2020-12-14 DIAGNOSIS — E7800 Pure hypercholesterolemia, unspecified: Secondary | ICD-10-CM

## 2020-12-14 DIAGNOSIS — I4892 Unspecified atrial flutter: Secondary | ICD-10-CM | POA: Diagnosis not present

## 2020-12-14 NOTE — Patient Instructions (Signed)
Medication Instructions:  Your physician recommends that you continue on your current medications as directed. Please refer to the Current Medication list given to you today.  Labwork: None ordered.  Testing/Procedures: None ordered.  Follow-Up:  Your physician wants you to follow-up in: 6 months with the Afib clinic, one year with Thompson Grayer, MD    You will receive a reminder letter in the mail two months in advance. If you don't receive a letter, please call our office to schedule the follow-up appointment.    Any Other Special Instructions Will Be Listed Below (If Applicable).  If you need a refill on your cardiac medications before your next appointment, please call your pharmacy.

## 2020-12-14 NOTE — Progress Notes (Signed)
PCP: [name], MD Primary Cardiologist: Dr Marlou Porch Primary EP: Dr Rayann Heman  Carolyn Vazquez is a 80 y.o. female who presents today for routine electrophysiology followup.  Since last being seen in our clinic, the patient reports doing very well. + intermittent edema.  + rare palpitations.  Today, she denies symptoms of chest pain, shortness of breath,  dizziness, presyncope, or syncope.  The patient is otherwise without complaint today.   Past Medical History:  Diagnosis Date  . Chronic diastolic CHF (congestive heart failure) ([place])   . Hyperlipidemia   . Hypertension   . Hypothyroidism   . Mild dilation of ascending aorta (HCC)   . Persistent atrial fibrillation ([place])    a. dx 06/2017.  . Stroke (cerebrum) ([place])   . 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 ABDOMINAL HYSTERECTOMY  1979  . ATRIAL FIBRILLATION ABLATION N/A 07/08/2018   Procedure: ATRIAL FIBRILLATION ABLATION;  Surgeon: Thompson Grayer, MD;  Location: [facility] [facility];  Service: Cardiovascular;  Laterality: N/A;  . ATRIAL FIBRILLATION ABLATION N/A 04/30/2019   Procedure: ATRIAL FIBRILLATION ABLATION;  Surgeon: Thompson Grayer, MD;  Location: [place];  Service: Cardiovascular;  Laterality: N/A;  . BUNIONECTOMY Right   . CARDIOVERSION N/A 08/05/2017   Procedure: CARDIOVERSION;  Surgeon: [name], MD;  Location: [facility] ENDOSCOPY;  Service: Cardiovascular;  Laterality: N/A;  . CARDIOVERSION N/A 09/20/2017   Procedure: CARDIOVERSION;  Surgeon: [name], MD;  Location: [facility] ENDOSCOPY;  Service: Cardiovascular;  Laterality: N/A;  . CARDIOVERSION N/A 08/21/2018   Procedure: CARDIOVERSION;  Surgeon: Thayer Headings, MD;  Location: [facility];  Service: Cardiovascular;  Laterality: N/A;  . CHOLECYSTECTOMY    . 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 NASAL SINUS SURGERY      ROS- all systems are  reviewed and negatives except as per HPI above  Current Outpatient Medications  Medication Sig Dispense Refill  . ABREVA 10 % CREA Apply 1 application topically 2 (two) times daily as needed (cold sores.).    [name] acetaminophen (TYLENOL) 500 MG tablet Take 1,000 mg by mouth 2 (two) times daily as needed for moderate pain or headache.    . B Complex Vitamins (B-COMPLEX/B-12) TABS Take 1 tablet by mouth daily.     . calcium elemental as carbonate (BARIATRIC TUMS ULTRA) 400 MG chewable tablet Chew 1,000 mg by mouth as needed for heartburn.     . Coenzyme Q10 (COQ-10) 200 MG CAPS Take 200 mg by mouth 3 (three) times a week.     . diltiazem (CARDIZEM CD) 120 MG 24 hr capsule Take 1 capsule (120 mg total) by mouth 2 (two) times daily. 180 capsule 3  . ELIQUIS 5 MG TABS tablet Take 1 tablet by mouth twice daily 180 tablet 2  . escitalopram (LEXAPRO) 10 MG tablet Take 10 mg by mouth daily.     . fexofenadine (ALLEGRA) 180 MG tablet Take 180 mg by mouth daily.    . furosemide (LASIX) 40 MG tablet Take 1 tablet (40 mg total) by mouth daily. 90 tablet 2  . levothyroxine (SYNTHROID) 112 MCG tablet Take 112 mcg by mouth every morning.    [name] losartan (COZAAR) 25 MG tablet Take 1 tablet (25 mg total) by mouth daily. 90 tablet 3  . metoprolol succinate (TOPROL XL) 25 MG 24 hr tablet Take 1/2 tablet by mouth at  bedtime 45 tablet 3  . Multiple Vitamins-Minerals (ICAPS AREDS FORMULA PO) Take 1 capsule by mouth 2 (two) times daily.    . Polyvinyl Alcohol-Povidone (REFRESH OP) Place 1 drop into both eyes as needed.     . potassium chloride (KLOR-CON) 10 MEQ tablet Take 1 tablet (10 mEq total) by mouth 2 (two) times daily. 180 tablet 2  . rosuvastatin (CRESTOR) 5 MG tablet TAKE 1 TABLET BY MOUTH EVERY MONDAY, WEDNESDAY, AND FRIDAY 45 tablet 3  . sodium chloride (OCEAN) 0.65 % SOLN nasal spray Place 1 spray into both nostrils as needed for congestion.    . Vitamin D, Cholecalciferol, 50 MCG (2000 UT) CAPS Take 1 capsule  by mouth daily.     No current facility-administered medications for this visit.    Physical Exam: Vitals:   12/14/20 1448  BP: 132/78  Pulse: 73  SpO2: 95%  Weight: 154 lb (69.9 kg)  Height: 5\' 1"  (1.549 m)    GEN- The patient is well appearing, alert and oriented x 3 today.   Head- normocephalic, atraumatic Eyes-  Sclera clear, conjunctiva pink Ears- hearing intact Oropharynx- clear Lungs- Clear to ausculation bilaterally, normal work of breathing Heart- Regular rate and rhythm, no murmurs, rubs or gallops, PMI not laterally displaced GI- soft, NT, ND, + BS Extremities- no clubbing, cyanosis, or edema  Wt Readings from Last 3 Encounters:  12/14/20 154 lb (69.9 kg)  08/17/20 156 lb 6.4 oz (70.9 kg)  05/16/20 155 lb (70.3 kg)    EKG tracing ordered today is personally reviewed and shows sinus  Assessment and Plan:  1. Persistent atrial fibrillation/ atrial flutter Doing well post ablation (x2) off AAD therapy chads2vasc score is 6.  Continue eliquis  2. HTN Stable No change required today  3. HL Continue crestor  Risks, benefits and potential toxicities for medications prescribed and/or refilled reviewed with patient today.   Return to AF clinic in 6 months 12 months with me Dr Marlou Porch as scheduled  Thompson Grayer MD, [facility] 12/14/2020 3:03 PM

## 2020-12-21 NOTE — Addendum Note (Signed)
Addended by: Rose Phi on: 12/21/2020 01:53 PM   Modules accepted: Orders

## 2021-01-09 ENCOUNTER — Other Ambulatory Visit (HOSPITAL_COMMUNITY): Payer: Self-pay | Admitting: *Deleted

## 2021-01-09 MED ORDER — APIXABAN 5 MG PO TABS
5.0000 mg | ORAL_TABLET | Freq: Two times a day (BID) | ORAL | 2 refills | Status: DC
Start: 2021-01-09 — End: 2021-12-15

## 2021-01-13 DIAGNOSIS — M47816 Spondylosis without myelopathy or radiculopathy, lumbar region: Secondary | ICD-10-CM | POA: Diagnosis not present

## 2021-01-19 DIAGNOSIS — D6869 Other thrombophilia: Secondary | ICD-10-CM | POA: Diagnosis not present

## 2021-01-19 DIAGNOSIS — K219 Gastro-esophageal reflux disease without esophagitis: Secondary | ICD-10-CM | POA: Diagnosis not present

## 2021-01-19 DIAGNOSIS — Z79899 Other long term (current) drug therapy: Secondary | ICD-10-CM | POA: Diagnosis not present

## 2021-01-19 DIAGNOSIS — I10 Essential (primary) hypertension: Secondary | ICD-10-CM | POA: Diagnosis not present

## 2021-01-19 DIAGNOSIS — E039 Hypothyroidism, unspecified: Secondary | ICD-10-CM | POA: Diagnosis not present

## 2021-01-19 DIAGNOSIS — R7303 Prediabetes: Secondary | ICD-10-CM | POA: Diagnosis not present

## 2021-01-19 DIAGNOSIS — Z0000 Encounter for general adult medical examination without abnormal findings: Secondary | ICD-10-CM | POA: Diagnosis not present

## 2021-01-19 DIAGNOSIS — E782 Mixed hyperlipidemia: Secondary | ICD-10-CM | POA: Diagnosis not present

## 2021-01-19 DIAGNOSIS — M4712 Other spondylosis with myelopathy, cervical region: Secondary | ICD-10-CM | POA: Diagnosis not present

## 2021-01-19 DIAGNOSIS — E559 Vitamin D deficiency, unspecified: Secondary | ICD-10-CM | POA: Diagnosis not present

## 2021-01-19 DIAGNOSIS — M3131 Wegener's granulomatosis with renal involvement: Secondary | ICD-10-CM | POA: Diagnosis not present

## 2021-01-19 DIAGNOSIS — Z23 Encounter for immunization: Secondary | ICD-10-CM | POA: Diagnosis not present

## 2021-01-25 DIAGNOSIS — H0100B Unspecified blepharitis left eye, upper and lower eyelids: Secondary | ICD-10-CM | POA: Diagnosis not present

## 2021-01-25 DIAGNOSIS — H0100A Unspecified blepharitis right eye, upper and lower eyelids: Secondary | ICD-10-CM | POA: Diagnosis not present

## 2021-01-26 DIAGNOSIS — L821 Other seborrheic keratosis: Secondary | ICD-10-CM | POA: Diagnosis not present

## 2021-01-26 DIAGNOSIS — L570 Actinic keratosis: Secondary | ICD-10-CM | POA: Diagnosis not present

## 2021-01-26 DIAGNOSIS — D3612 Benign neoplasm of peripheral nerves and autonomic nervous system, upper limb, including shoulder: Secondary | ICD-10-CM | POA: Diagnosis not present

## 2021-01-26 DIAGNOSIS — L853 Xerosis cutis: Secondary | ICD-10-CM | POA: Diagnosis not present

## 2021-01-26 DIAGNOSIS — Z85828 Personal history of other malignant neoplasm of skin: Secondary | ICD-10-CM | POA: Diagnosis not present

## 2021-01-26 DIAGNOSIS — D3617 Benign neoplasm of peripheral nerves and autonomic nervous system of trunk, unspecified: Secondary | ICD-10-CM | POA: Diagnosis not present

## 2021-01-31 DIAGNOSIS — M47816 Spondylosis without myelopathy or radiculopathy, lumbar region: Secondary | ICD-10-CM | POA: Diagnosis not present

## 2021-02-07 ENCOUNTER — Other Ambulatory Visit: Payer: Self-pay | Admitting: Cardiology

## 2021-02-07 ENCOUNTER — Other Ambulatory Visit (HOSPITAL_COMMUNITY): Payer: Self-pay | Admitting: Internal Medicine

## 2021-02-22 ENCOUNTER — Other Ambulatory Visit (HOSPITAL_COMMUNITY): Payer: Self-pay | Admitting: Cardiology

## 2021-02-22 NOTE — Progress Notes (Signed)
Cardiology Office Note:    Date:  02/23/2021   ID:  Carolyn, Vazquez 21-Dec-1940, MRN [id number]  PCP:  [name], [place]  Cardiologist:  Candee Furbish, MD  Advanced Practice Provider:  No care team member to display Electrophysiologist:  Thompson Grayer, MD       Referring MD: [name], MD    History of Present Illness:    Carolyn Vazquez is a 80 y.o. female here for follow-up of persistent atrial fibrillation.  Recently saw Dr. Rayann Heman on 12/14/2020.  Doing fairly well.  Rare palpitations and rare intermittent edema.  No chest pain, no shortness of breath.  I last saw her personally in 2019.  She has been seeing Roderic Palau and Dr. Rayann Heman since.  Since Carolyn Vazquez is her daughter, I take care of her.  She has had prior Wegener's vasculitis.  Carolyn Vazquez 06/20/1961 daughter.  Overall doing very well.  Feels great.  Seems to be under good control with Dr. Rayann Heman.  I  Past Medical History:  Diagnosis Date  . Chronic diastolic CHF (congestive heart failure) ([place])   . Hyperlipidemia   . Hypertension   . Hypothyroidism   . Mild dilation of ascending aorta (HCC)   . Persistent atrial fibrillation ([place])    a. dx 06/2017.  . Stroke (cerebrum) ([place])   . 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 Wegener's granulomatosis     Past Surgical History:  Procedure Laterality Date  . ABDOMINAL HYSTERECTOMY  1979  . ATRIAL FIBRILLATION ABLATION N/A 07/08/2018   Procedure: ATRIAL FIBRILLATION ABLATION;  Surgeon: Thompson Grayer, MD;  Location: [place] [facility];  Service: Cardiovascular;  Laterality: N/A;  . ATRIAL FIBRILLATION ABLATION N/A 04/30/2019   Procedure: ATRIAL FIBRILLATION ABLATION;  Surgeon: Thompson Grayer, MD;  Location: [place] [facility];  Service: Cardiovascular;  Laterality: N/A;  . BUNIONECTOMY Right   . CARDIOVERSION N/A 08/05/2017   Procedure: CARDIOVERSION;  Surgeon: [name], MD;  Location: [facility] ENDOSCOPY;   Service: Cardiovascular;  Laterality: N/A;  . CARDIOVERSION N/A 09/20/2017   Procedure: CARDIOVERSION;  Surgeon: [name], MD;  Location: [facility] ENDOSCOPY;  Service: Cardiovascular;  Laterality: N/A;  . CARDIOVERSION N/A 08/21/2018   Procedure: CARDIOVERSION;  Surgeon: Thayer Headings, MD;  Location: [place];  Service: Cardiovascular;  Laterality: N/A;  . CHOLECYSTECTOMY    . 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 NASAL SINUS SURGERY      Current Medications: Current Meds  Medication Sig  . ABREVA 10 % CREA Apply 1 application topically 2 (two) times daily as needed (cold sores.).  [name] acetaminophen (TYLENOL) 500 MG tablet Take 1,000 mg by mouth 2 (two) times daily as needed for moderate pain or headache.  [name] apixaban (ELIQUIS) 5 MG TABS tablet Take 1 tablet (5 mg total) by mouth 2 (two) times daily.  . B Complex Vitamins (B-COMPLEX/B-12) TABS Take 1 tablet by mouth daily.   . calcium elemental as carbonate (BARIATRIC TUMS ULTRA) 400 MG chewable tablet Chew 1,000 mg by mouth as needed for heartburn.   . Coenzyme Q10 (COQ-10) 200 MG CAPS Take 200 mg by mouth 3 (three) times a week.   . diltiazem (CARDIZEM CD) 120 MG 24 hr capsule Take 1 capsule by mouth twice daily  . escitalopram (LEXAPRO) 10 MG tablet Take 10 mg by mouth daily.   . fexofenadine (ALLEGRA) 180 MG tablet Take 180 mg by mouth  daily.  . furosemide (LASIX) 40 MG tablet Take 1 tablet by mouth daily  . levothyroxine (SYNTHROID) 112 MCG tablet Take 112 mcg by mouth every morning.  [name] losartan (COZAAR) 25 MG tablet Take 1 tablet by mouth once daily  . metoprolol succinate (TOPROL XL) 25 MG 24 hr tablet Take 1/2 tablet by mouth at bedtime  . Multiple Vitamins-Minerals (ICAPS AREDS FORMULA PO) Take 1 capsule by mouth 2 (two) times daily.  . Polyvinyl Alcohol-Povidone (REFRESH OP) Place 1 drop into both eyes as needed.   . potassium chloride (KLOR-CON) 10 MEQ tablet Take 1 tablet (10 mEq total) by mouth 2 (two) times daily.   . rosuvastatin (CRESTOR) 5 MG tablet TAKE 1 TABLET BY MOUTH EVERY MONDAY, WEDNESDAY, AND FRIDAY  . sodium chloride (OCEAN) 0.65 % SOLN nasal spray Place 1 spray into both nostrils as needed for congestion.  . Vitamin D, Cholecalciferol, 50 MCG (2000 UT) CAPS Take 1 capsule by mouth daily.     Allergies:   Codeine, Atorvastatin, Penicillins, and Simvastatin   Social History   Socioeconomic History  . Marital status: Widowed    Spouse name: Not on file  . Number of children: Not on file  . Years of education: Not on file  . Highest education level: Not on file  Occupational History  . Not on file  Tobacco Use  . Smoking status: Never Smoker  . Smokeless tobacco: Never Used  Vaping Use  . Vaping Use: Never used  Substance and Sexual Activity  . Alcohol use: Yes    Alcohol/week: 1.0 standard drink    Types: 1 Glasses of wine per week    Comment: every day glass of wine  . Drug use: No  . Sexual activity: Not on file  Other Topics Concern  . Not on file  Social History Narrative  . Not on file   Social Determinants of Health   Financial Resource Strain: Not on file  Food Insecurity: Not on file  Transportation Needs: Not on file  Physical Activity: Not on file  Stress: Not on file  Social Connections: Not on file     Family History: The patient's family history includes Alcoholism in her father; Asthma in her brother and sister; Diabetes in her brother; Heart attack in her brother; Stroke in her father and mother; Thyroid disease in her mother, sister, and sister.  ROS:   Please see the history of present illness.     All other systems reviewed and are negative.  EKGs/Labs/Other Studies Reviewed:    The following studies were reviewed today:  Nuclear stress test 2016-no evidence of ischemia normal EF  Echocardiogram 2018-EF 55%   Recent Labs: No results found for requested labs within last 8760 hours.  Recent Lipid Panel    Component Value Date/Time    CHOL 192 07/07/2017 0413   TRIG 66 07/07/2017 0413   HDL 55 07/07/2017 0413   CHOLHDL 3.5 07/07/2017 0413   VLDL 13 07/07/2017 0413   LDLCALC 124 (H) 07/07/2017 0413     Risk Assessment/Calculations:      Physical Exam:    VS:  BP 110/70 (BP Location: Left Arm, Patient Position: Sitting, Cuff Size: Normal)   Pulse 69   Ht 5\' 1"  (1.549 m)   Wt 154 lb (69.9 kg)   LMP  (LMP Unknown)   SpO2 97%   BMI 29.10 kg/m     Wt Readings from Last 3 Encounters:  02/23/21 154 lb (69.9 kg)  12/14/20  154 lb (69.9 kg)  08/17/20 156 lb 6.4 oz (70.9 kg)     GEN:  Well nourished, well developed in no acute distress HEENT: Normal NECK: No JVD; No carotid bruits LYMPHATICS: No lymphadenopathy CARDIAC: RRR, no murmurs, rubs, gallops RESPIRATORY:  Clear to auscultation without rales, wheezing or rhonchi  ABDOMEN: Soft, non-tender, non-distended MUSCULOSKELETAL:  No edema; No deformity  SKIN: Warm and dry NEUROLOGIC:  Alert and oriented x 3 PSYCHIATRIC:  Normal affect   ASSESSMENT:    1. Persistent atrial fibrillation ([place])   2. Essential hypertension    PLAN:    In order of problems listed above:  Persistent atrial fibrillation/flutter -Doing fairly well post ablation x2 she is off of antiarrhythmic therapy.  Her CHA2DS2-VASc score is 6. Cardioversion x 3.  Regular rate and rhythm doing very well.  No problems.  Chronic anticoagulation -Continuing with Eliquis.  No signs of bleeding.  Hemoglobin and creatinine are stable.  Continue to monitor  Hyperlipidemia -Continuing with Crestor.  No myalgias.  Lower extremity edema -On Lasix.  Doing well.     Medication Adjustments/Labs and Tests Ordered: Current medicines are reviewed at length with the patient today.  Concerns regarding medicines are outlined above.  No orders of the defined types were placed in this encounter.  No orders of the defined types were placed in this encounter.   Patient Instructions  Medication  Instructions:  The current medical regimen is effective;  continue present plan and medications.  *If you need a refill on your cardiac medications before your next appointment, please call your pharmacy*  Follow-Up: At [facility], you and your health needs are our priority.  As part of our continuing mission to provide you with exceptional heart care, we have created designated Provider Care Teams.  These Care Teams include your primary Cardiologist (physician) and Advanced Practice Providers (APPs -  Physician Assistants and Nurse Practitioners) who all work together to provide you with the care you need, when you need it.  We recommend signing up for the patient portal called "MyChart".  Sign up information is provided on this After Visit Summary.  MyChart is used to connect with patients for Virtual Visits (Telemedicine).  Patients are able to view lab/test results, encounter notes, upcoming appointments, etc.  Non-urgent messages can be sent to your provider as well.   To learn more about what you can do with MyChart, go to [email or web address].    Your next appointment:   6 month(s)  The format for your next appointment:   In Person  Provider:   Candee Furbish, MD  Thank you for choosing [facility]!!         Signed, Candee Furbish, MD  02/23/2021 3:12 PM    [place]

## 2021-02-23 ENCOUNTER — Ambulatory Visit: Payer: Medicare Other | Admitting: Cardiology

## 2021-02-23 ENCOUNTER — Other Ambulatory Visit: Payer: Self-pay

## 2021-02-23 ENCOUNTER — Encounter: Payer: Self-pay | Admitting: Cardiology

## 2021-02-23 VITALS — BP 110/70 | HR 69 | Ht 61.0 in | Wt 154.0 lb

## 2021-02-23 DIAGNOSIS — I4819 Other persistent atrial fibrillation: Secondary | ICD-10-CM

## 2021-02-23 DIAGNOSIS — I10 Essential (primary) hypertension: Secondary | ICD-10-CM

## 2021-02-23 NOTE — Patient Instructions (Signed)

## 2021-03-15 DIAGNOSIS — H0100B Unspecified blepharitis left eye, upper and lower eyelids: Secondary | ICD-10-CM | POA: Diagnosis not present

## 2021-03-15 DIAGNOSIS — H353112 Nonexudative age-related macular degeneration, right eye, intermediate dry stage: Secondary | ICD-10-CM | POA: Diagnosis not present

## 2021-03-15 DIAGNOSIS — H353221 Exudative age-related macular degeneration, left eye, with active choroidal neovascularization: Secondary | ICD-10-CM | POA: Diagnosis not present

## 2021-03-21 DIAGNOSIS — D3102 Benign neoplasm of left conjunctiva: Secondary | ICD-10-CM | POA: Diagnosis not present

## 2021-03-22 DIAGNOSIS — H5213 Myopia, bilateral: Secondary | ICD-10-CM | POA: Diagnosis not present

## 2021-03-30 DIAGNOSIS — Z880 Allergy status to penicillin: Secondary | ICD-10-CM | POA: Diagnosis not present

## 2021-03-30 DIAGNOSIS — I77810 Thoracic aortic ectasia: Secondary | ICD-10-CM | POA: Diagnosis not present

## 2021-03-30 DIAGNOSIS — I10 Essential (primary) hypertension: Secondary | ICD-10-CM | POA: Diagnosis not present

## 2021-03-30 DIAGNOSIS — I110 Hypertensive heart disease with heart failure: Secondary | ICD-10-CM | POA: Diagnosis not present

## 2021-03-30 DIAGNOSIS — Z882 Allergy status to sulfonamides status: Secondary | ICD-10-CM | POA: Diagnosis not present

## 2021-03-30 DIAGNOSIS — I4891 Unspecified atrial fibrillation: Secondary | ICD-10-CM | POA: Diagnosis not present

## 2021-03-30 DIAGNOSIS — I5030 Unspecified diastolic (congestive) heart failure: Secondary | ICD-10-CM | POA: Diagnosis not present

## 2021-03-30 DIAGNOSIS — I776 Arteritis, unspecified: Secondary | ICD-10-CM | POA: Diagnosis not present

## 2021-04-26 DIAGNOSIS — H353221 Exudative age-related macular degeneration, left eye, with active choroidal neovascularization: Secondary | ICD-10-CM | POA: Diagnosis not present

## 2021-04-26 DIAGNOSIS — D3102 Benign neoplasm of left conjunctiva: Secondary | ICD-10-CM | POA: Diagnosis not present

## 2021-04-26 DIAGNOSIS — H353112 Nonexudative age-related macular degeneration, right eye, intermediate dry stage: Secondary | ICD-10-CM | POA: Diagnosis not present

## 2021-05-17 ENCOUNTER — Ambulatory Visit (HOSPITAL_COMMUNITY): Payer: Medicare Other | Admitting: Nurse Practitioner

## 2021-05-30 DIAGNOSIS — L738 Other specified follicular disorders: Secondary | ICD-10-CM | POA: Diagnosis not present

## 2021-05-30 DIAGNOSIS — L820 Inflamed seborrheic keratosis: Secondary | ICD-10-CM | POA: Diagnosis not present

## 2021-05-30 DIAGNOSIS — Z85828 Personal history of other malignant neoplasm of skin: Secondary | ICD-10-CM | POA: Diagnosis not present

## 2021-05-30 DIAGNOSIS — B078 Other viral warts: Secondary | ICD-10-CM | POA: Diagnosis not present

## 2021-06-07 ENCOUNTER — Encounter (HOSPITAL_COMMUNITY): Payer: Self-pay | Admitting: Nurse Practitioner

## 2021-06-07 ENCOUNTER — Ambulatory Visit (HOSPITAL_COMMUNITY)
Admission: RE | Admit: 2021-06-07 | Discharge: 2021-06-07 | Disposition: A | Discharge status: Home / Self Care | Payer: Medicare Other | Source: Ambulatory Visit | Attending: Nurse Practitioner | Admitting: Nurse Practitioner

## 2021-06-07 ENCOUNTER — Other Ambulatory Visit: Payer: Self-pay

## 2021-06-07 VITALS — BP 106/76 | HR 73 | Ht 61.0 in | Wt 155.0 lb

## 2021-06-07 DIAGNOSIS — Z8249 Family history of ischemic heart disease and other diseases of the circulatory system: Secondary | ICD-10-CM | POA: Insufficient documentation

## 2021-06-07 DIAGNOSIS — Z885 Allergy status to narcotic agent status: Secondary | ICD-10-CM | POA: Insufficient documentation

## 2021-06-07 DIAGNOSIS — I4819 Other persistent atrial fibrillation: Secondary | ICD-10-CM | POA: Diagnosis not present

## 2021-06-07 DIAGNOSIS — Z880 Allergy status to penicillin: Secondary | ICD-10-CM | POA: Diagnosis not present

## 2021-06-07 DIAGNOSIS — Z7901 Long term (current) use of anticoagulants: Secondary | ICD-10-CM | POA: Diagnosis not present

## 2021-06-07 DIAGNOSIS — D6869 Other thrombophilia: Secondary | ICD-10-CM

## 2021-06-07 NOTE — Progress Notes (Signed)
Primary Care Physician: [name], MD Referring Physician: Dr. [name] is a 80 y.o. female with a h/o afib with h/o ablations x 2, last  one in June 2020. She is enjoying SR,  CHA2DS2VASc of at least 6. She reports today that she is having vivid dreams and is questioning if it could be any of her meds. BB may be the culprit. Will try to move to am than have her take in the pm.   Today, she denies symptoms of palpitations, chest pain, shortness of breath, orthopnea, PND, lower extremity edema, dizziness, presyncope, syncope, or neurologic sequela. The patient is tolerating medications without difficulties and is otherwise without complaint today.   Past Medical History:  Diagnosis Date   Chronic diastolic CHF (congestive heart failure) (HCC)    Hyperlipidemia    Hypertension    Hypothyroidism    Mild dilation of ascending aorta (HCC)    Persistent atrial fibrillation ([place])    a. dx 06/2017.   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 ([place]    Wegener's granulomatosis    Past Surgical History:  Procedure Laterality Date   ABDOMINAL HYSTERECTOMY  1979   ATRIAL FIBRILLATION ABLATION N/A 07/08/2018   Procedure: ATRIAL FIBRILLATION ABLATION;  Surgeon: Thompson Grayer, MD;  Location: [place] [facility];  Service: Cardiovascular;  Laterality: N/A;   ATRIAL FIBRILLATION ABLATION N/A 04/30/2019   Procedure: ATRIAL FIBRILLATION ABLATION;  Surgeon: Thompson Grayer, MD;  Location: [place] [facility];  Service: Cardiovascular;  Laterality: N/A;   BUNIONECTOMY Right    CARDIOVERSION N/A 08/05/2017   Procedure: CARDIOVERSION;  Surgeon: [name], MD;  Location: [facility] ENDOSCOPY;  Service: Cardiovascular;  Laterality: N/A;   CARDIOVERSION N/A 09/20/2017   Procedure: CARDIOVERSION;  Surgeon: [name], MD;  Location: [place];  Service: Cardiovascular;  Laterality: N/A;   CARDIOVERSION N/A 08/21/2018   Procedure: CARDIOVERSION;  Surgeon:  Thayer Headings, MD;  Location: [facility] ENDOSCOPY;  Service: Cardiovascular;  Laterality: N/A;   CHOLECYSTECTOMY     LUNG SURGERY  2000's   both vasculitis   NASAL SINUS SURGERY      Current Outpatient Medications  Medication Sig Dispense Refill   ABREVA 10 % CREA Apply 1 application topically 2 (two) times daily as needed (cold sores.).     acetaminophen (TYLENOL) 500 MG tablet Take 1,000 mg by mouth 2 (two) times daily as needed for moderate pain or headache.     apixaban (ELIQUIS) 5 MG TABS tablet Take 1 tablet (5 mg total) by mouth 2 (two) times daily. 180 tablet 2   B Complex Vitamins (B-COMPLEX/B-12) TABS Take 1 tablet by mouth daily.      calcium elemental as carbonate (BARIATRIC TUMS ULTRA) 400 MG chewable tablet Chew 1,000 mg by mouth as needed for heartburn.      Coenzyme Q10 (COQ-10) 200 MG CAPS Take 200 mg by mouth 3 (three) times a week.      diltiazem (CARDIZEM CD) 120 MG 24 hr capsule Take 1 capsule by mouth twice daily 180 capsule 3   fexofenadine (ALLEGRA) 180 MG tablet Take 180 mg by mouth daily.     furosemide (LASIX) 40 MG tablet Take 1 tablet by mouth daily 90 tablet 3   levothyroxine (SYNTHROID) 112 MCG tablet Take 112 mcg by mouth every morning.     losartan (COZAAR) 25 MG tablet Take 1 tablet by mouth once daily 90 tablet 3   metoprolol succinate (  TOPROL XL) 25 MG 24 hr tablet Take 1/2 tablet by mouth at bedtime 45 tablet 3   Multiple Vitamins-Minerals (ICAPS AREDS FORMULA PO) Take 1 capsule by mouth 2 (two) times daily.     Polyvinyl Alcohol-Povidone (REFRESH OP) Place 1 drop into both eyes as needed.      potassium chloride (KLOR-CON) 10 MEQ tablet Take 1 tablet (10 mEq total) by mouth 2 (two) times daily. 180 tablet 2   rosuvastatin (CRESTOR) 5 MG tablet TAKE 1 TABLET BY MOUTH EVERY MONDAY, WEDNESDAY, AND FRIDAY 45 tablet 3   sodium chloride (OCEAN) 0.65 % SOLN nasal spray Place 1 spray into both nostrils as needed for congestion.     Vitamin D, Cholecalciferol, 50  MCG (2000 UT) CAPS Take 1 capsule by mouth daily.     No current facility-administered medications for this encounter.    Allergies  Allergen Reactions   Codeine Other (See Comments)    Patient isn't sure of reaction. Happens years ago - 1960's   Amlodipine Besylate     Other reaction(s): swelling   Atorvastatin Other (See Comments)    Depression    Sulfa Antibiotics     Other reaction(s): rash   Penicillins Rash    Happened in 1960s Has patient had a PCN reaction causing immediate rash, facial/tongue/throat swelling, SOB or lightheadedness with hypotension: Unknown Has patient had a PCN reaction causing severe rash involving mucus membranes or skin necrosis: Unknown Has patient had a PCN reaction that required hospitalization: Unknown Has patient had a PCN reaction occurring within the last 10 years: No If all of the above answers are "NO", then may proceed with Cephalosporin use.    Simvastatin Other (See Comments)    JOINT PAIN    Social History   Socioeconomic History   Marital status: Widowed    Spouse name: Not on file   Number of children: Not on file   Years of education: Not on file   Highest education level: Not on file  Occupational History   Not on file  Tobacco Use   Smoking status: Never   Smokeless tobacco: Never  Vaping Use   Vaping Use: Never used  Substance and Sexual Activity   Alcohol use: Yes    Alcohol/week: 1.0 - 2.0 standard drink    Types: 1 - 2 Glasses of wine per week    Comment: every day glass of wine   Drug use: No   Sexual activity: Not on file  Other Topics Concern   Not on file  Social History Narrative   Not on file   Social Determinants of Health   Financial Resource Strain: Not on file  Food Insecurity: Not on file  Transportation Needs: Not on file  Physical Activity: Not on file  Stress: Not on file  Social Connections: Not on file  Intimate Partner Violence: Not on file    Family History  Problem Relation Age of  Onset   Stroke Mother    Thyroid disease Mother    Stroke Father    Alcoholism Father    Asthma Sister    Thyroid disease Sister    Diabetes Brother    Thyroid disease Sister    Asthma Brother    Heart attack Brother     ROS- All systems are reviewed and negative except as per the HPI above  Physical Exam: Vitals:   06/07/21 1401  BP: 106/76  Pulse: 73  Weight: 70.3 kg  Height: 5\' 1"  (1.549 m)  Wt Readings from Last 3 Encounters:  06/07/21 70.3 kg  02/23/21 69.9 kg  12/14/20 69.9 kg    Labs: Lab Results  Component Value Date   NA 140 04/27/2019   K 4.1 04/27/2019   CL 98 04/27/2019   CO2 25 04/27/2019   GLUCOSE 138 (H) 04/27/2019   BUN 14 04/27/2019   CREATININE 0.93 04/27/2019   CALCIUM 9.4 04/27/2019   No results found for: INR Lab Results  Component Value Date   CHOL 192 07/07/2017   HDL 55 07/07/2017   LDLCALC 124 (H) 07/07/2017   TRIG 66 07/07/2017     GEN- The patient is well appearing, alert and oriented x 3 today.   Head- normocephalic, atraumatic Eyes-  Sclera clear, conjunctiva pink Ears- hearing intact Oropharynx- clear Neck- supple, no JVP Lymph- no cervical lymphadenopathy Lungs- Clear to ausculation bilaterally, normal work of breathing Heart- Regular rate and rhythm, no murmurs, rubs or gallops, PMI not laterally displaced GI- soft, NT, ND, + BS Extremities- no clubbing, cyanosis, or edema MS- no significant deformity or atrophy Skin- no rash or lesion Psych- euthymic mood, full affect Neuro- strength and sensation are intact  EKG NSR at 73 bpm, pr int 140 ms, qrs int 86 ms, qtc 440 ms   Assessment and Plan: 1. Persisitent afib Doing well after her second ablation June 2020  maintaining  SR Continue on 1/2 tab of 25 mg metoprolol succinate but move to am to see if that  lessens her vivid dreams  Continue 120 mg cardizem bid   2. CHA2DS2VASc score of at least 6 Continue eliquis 5 mg bid   F/u with Dr. Marlou Porch in October  and  Dr. Rayann Heman in February 2023  Butch [name] C. Rafael Salway, [facility] [address] [place], [place] [address] [phone]

## 2021-06-25 ENCOUNTER — Other Ambulatory Visit (HOSPITAL_COMMUNITY): Payer: Self-pay | Admitting: Nurse Practitioner

## 2021-07-05 DIAGNOSIS — H353221 Exudative age-related macular degeneration, left eye, with active choroidal neovascularization: Secondary | ICD-10-CM | POA: Diagnosis not present

## 2021-07-25 DIAGNOSIS — Z23 Encounter for immunization: Secondary | ICD-10-CM | POA: Diagnosis not present

## 2021-08-14 ENCOUNTER — Other Ambulatory Visit (HOSPITAL_COMMUNITY): Payer: Self-pay | Admitting: Nurse Practitioner

## 2021-09-11 ENCOUNTER — Ambulatory Visit (HOSPITAL_BASED_OUTPATIENT_CLINIC_OR_DEPARTMENT_OTHER): Payer: Medicare Other | Admitting: Cardiology

## 2021-09-11 ENCOUNTER — Encounter (HOSPITAL_BASED_OUTPATIENT_CLINIC_OR_DEPARTMENT_OTHER): Payer: Self-pay | Admitting: Cardiology

## 2021-09-11 ENCOUNTER — Other Ambulatory Visit: Payer: Self-pay

## 2021-09-11 DIAGNOSIS — I2584 Coronary atherosclerosis due to calcified coronary lesion: Secondary | ICD-10-CM | POA: Diagnosis not present

## 2021-09-11 DIAGNOSIS — Z7901 Long term (current) use of anticoagulants: Secondary | ICD-10-CM

## 2021-09-11 DIAGNOSIS — E782 Mixed hyperlipidemia: Secondary | ICD-10-CM

## 2021-09-11 DIAGNOSIS — I2510 Atherosclerotic heart disease of native coronary artery without angina pectoris: Secondary | ICD-10-CM | POA: Diagnosis not present

## 2021-09-11 DIAGNOSIS — I4819 Other persistent atrial fibrillation: Secondary | ICD-10-CM

## 2021-09-11 MED ORDER — METOPROLOL SUCCINATE ER 25 MG PO TB24
ORAL_TABLET | ORAL | 2 refills | Status: DC
Start: 2021-09-11 — End: 2022-02-26

## 2021-09-11 MED ORDER — EZETIMIBE 10 MG PO TABS
10.0000 mg | ORAL_TABLET | Freq: Every day | ORAL | 3 refills | Status: DC
Start: 2021-09-11 — End: 2022-08-10

## 2021-09-11 NOTE — Assessment & Plan Note (Signed)
Continue with Eliquis 5 mg twice a day for stroke prevention.  She has had a prior TIA.

## 2021-09-11 NOTE — Assessment & Plan Note (Signed)
Calcium score was 28 previously on CT scan 2019.  Because of this, I would like for her LDL to be less than 70.  She is on maximally tolerated Crestor which is 5 mg 3 times a week.  We will add Zetia 10 mg once a day.  She is going to have her blood work done in February with Dr. Stephanie Acre

## 2021-09-11 NOTE — Patient Instructions (Signed)
Medication Instructions:  Please start Zetia 10 mg daily. Continue all other medications as listed.  *If you need a refill on your cardiac medications before your next appointment, please call your pharmacy*  Follow-Up: At [facility], you and your health needs are our priority.  As part of our continuing mission to provide you with exceptional heart care, we have created designated Provider Care Teams.  These Care Teams include your primary Cardiologist (physician) and Advanced Practice Providers (APPs -  Physician Assistants and Nurse Practitioners) who all work together to provide you with the care you need, when you need it.  We recommend signing up for the patient portal called "MyChart".  Sign up information is provided on this After Visit Summary.  MyChart is used to connect with patients for Virtual Visits (Telemedicine).  Patients are able to view lab/test results, encounter notes, upcoming appointments, etc.  Non-urgent messages can be sent to your provider as well.   To learn more about what you can do with MyChart, go to [email or web address].    Your next appointment:   6 month(s)  The format for your next appointment:   In Person  Provider:   You will follow up in the [facility] located at [facility]. Your provider will be: Roderic Palau, NP and   Dr Marlou Porch in 1 year.   Thank you for choosing [place]!!

## 2021-09-11 NOTE — Assessment & Plan Note (Signed)
Post ablation x2.  Off of antiarrhythmic therapy.  Taking diltiazem CD1 20 mg once a day.  She is also taking low-dose metoprolol 12.5 mg in the morning.  When she took this in the evening this may have contributed to vivid dreams.  CHA2DS2-VASc score is 6.  Continue with Eliquis.  She did feel some palpitations lasting about 10 minutes.  If this recurs, she will let us know.  I did state that she could take an additional metoprolol in this setting.  I will have her follow-up with Roderic Palau in 6 months just to make sure everything is going well.  She will see me back in 1 year.

## 2021-09-11 NOTE — Progress Notes (Signed)
Cardiology Office Note:    Date:  09/11/2021   ID:  Carolyn, Vazquez 11/02/1941, MRN [id number]  PCP:  [name], [place]  Cardiologist:  Candee Furbish, MD  Advanced Practice Provider:  No care team member to display Electrophysiologist:  Thompson Grayer, MD       Referring MD: [name], MD    History of Present Illness:    Carolyn Vazquez is a 80 y.o. female here for the follow-up of atrial fibrillation post ablation 04/2019.  She has had prior Wegener's vasculitis.  I saw her personally in 2019.  She has been seeing Roderic Palau and Dr. Rayann Heman since.  Carolyn Vazquez (06/20/1961) is her daughter, I take care of her.  Saw Dr. Rayann Heman on 12/14/2020.  Doing fairly well.  Rare palpitations and rare intermittent edema.  No chest pain, no shortness of breath.  At her last appointment 02/2021 she was doing very well. Feels great.  Seems to be under good control with Dr. Rayann Heman.    Today: Overall, she is feeling good. However, last week she had a few 10-15 minute episodes of a "jittery" feeling or a "flutter." She states that she almost felt like she went into Afib. Usually she sits and rests until her palpitations subside. Generally she felt ill for a day following her episodes.  Currently she is taking metoprolol in the morning due to side effects of nightmares when she was taking a PM dose.  She is scheduled for her annual physical in February or March 2023.  She denies any chest pain, or shortness of breath. No lightheadedness, headaches, syncope, orthopnea, PND, lower extremity edema or exertional symptoms.   Past Medical History:  Diagnosis Date   Chronic diastolic CHF (congestive heart failure) (HCC)    Hyperlipidemia    Hypertension    Hypothyroidism    Mild dilation of ascending aorta (HCC)    Persistent atrial fibrillation ([place])    a. dx 06/2017.   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 ([place])     Wegener's granulomatosis     Past Surgical History:  Procedure Laterality Date   ABDOMINAL HYSTERECTOMY  1979   ATRIAL FIBRILLATION ABLATION N/A 07/08/2018   Procedure: ATRIAL FIBRILLATION ABLATION;  Surgeon: Thompson Grayer, MD;  Location: [place] [facility];  Service: Cardiovascular;  Laterality: N/A;   ATRIAL FIBRILLATION ABLATION N/A 04/30/2019   Procedure: ATRIAL FIBRILLATION ABLATION;  Surgeon: Thompson Grayer, MD;  Location: [place] [facility];  Service: Cardiovascular;  Laterality: N/A;   BUNIONECTOMY Right    CARDIOVERSION N/A 08/05/2017   Procedure: CARDIOVERSION;  Surgeon: [name], MD;  Location: [facility] ENDOSCOPY;  Service: Cardiovascular;  Laterality: N/A;   CARDIOVERSION N/A 09/20/2017   Procedure: CARDIOVERSION;  Surgeon: [name], MD;  Location: [facility] ENDOSCOPY;  Service: Cardiovascular;  Laterality: N/A;   CARDIOVERSION N/A 08/21/2018   Procedure: CARDIOVERSION;  Surgeon: Thayer Headings, MD;  Location: [facility] ENDOSCOPY;  Service: Cardiovascular;  Laterality: N/A;   CHOLECYSTECTOMY     LUNG SURGERY  2000's   both vasculitis   NASAL SINUS SURGERY      Current Medications: Current Meds  Medication Sig   ABREVA 10 % CREA Apply 1 application topically 2 (two) times daily as needed (cold sores.).   acetaminophen (TYLENOL) 500 MG tablet Take 1,000 mg by mouth 2 (two) times daily as needed for moderate pain or headache.   apixaban (ELIQUIS) 5 MG  TABS tablet Take 1 tablet (5 mg total) by mouth 2 (two) times daily.   B Complex Vitamins (B-COMPLEX/B-12) TABS Take 1 tablet by mouth daily.    calcium elemental as carbonate (BARIATRIC TUMS ULTRA) 400 MG chewable tablet Chew 1,000 mg by mouth as needed for heartburn.    Coenzyme Q10 (COQ-10) 200 MG CAPS Take 200 mg by mouth 3 (three) times a week.    diltiazem (CARDIZEM CD) 120 MG 24 hr capsule Take 1 capsule by mouth twice daily   escitalopram (LEXAPRO) 10 MG tablet Take 10 mg by mouth daily. Per patient taking 1/2 tablet daily    ezetimibe (ZETIA) 10 MG tablet Take 1 tablet (10 mg total) by mouth daily.   fexofenadine (ALLEGRA) 180 MG tablet Take 180 mg by mouth daily.   furosemide (LASIX) 40 MG tablet Take 1 tablet by mouth daily   levothyroxine (SYNTHROID) 112 MCG tablet Take 112 mcg by mouth every morning.   losartan (COZAAR) 25 MG tablet Take 1 tablet by mouth once daily   losartan (COZAAR) 25 MG tablet Take 25 mg by mouth daily.   Multiple Vitamins-Minerals (ICAPS AREDS FORMULA PO) Take 1 capsule by mouth 2 (two) times daily.   Polyvinyl Alcohol-Povidone (REFRESH OP) Place 1 drop into both eyes as needed.    potassium chloride (KLOR-CON) 10 MEQ tablet Take 1 tablet by mouth twice daily   rosuvastatin (CRESTOR) 5 MG tablet TAKE 1 TABLET BY MOUTH EVERY MONDAY, WEDNESDAY, AND FRIDAY   sodium chloride (OCEAN) 0.65 % SOLN nasal spray Place 1 spray into both nostrils as needed for congestion.   Vitamin D, Cholecalciferol, 50 MCG (2000 UT) CAPS Take 1 capsule by mouth daily.   [DISCONTINUED] metoprolol succinate (TOPROL-XL) 25 MG 24 hr tablet TAKE 1/2 (ONE-HALF) TABLET BY MOUTH AT BEDTIME     Allergies:   Codeine, Amlodipine besylate, Atorvastatin, Sulfa antibiotics, Penicillins, and Simvastatin   Social History   Socioeconomic History   Marital status: Widowed    Spouse name: Not on file   Number of children: Not on file   Years of education: Not on file   Highest education level: Not on file  Occupational History   Not on file  Tobacco Use   Smoking status: Never   Smokeless tobacco: Never  Vaping Use   Vaping Use: Never used  Substance and Sexual Activity   Alcohol use: Yes    Alcohol/week: 1.0 - 2.0 standard drink    Types: 1 - 2 Glasses of wine per week    Comment: every day glass of wine   Drug use: No   Sexual activity: Not on file  Other Topics Concern   Not on file  Social History Narrative   Not on file   Social Determinants of Health   Financial Resource Strain: Not on file  Food  Insecurity: Not on file  Transportation Needs: Not on file  Physical Activity: Not on file  Stress: Not on file  Social Connections: Not on file     Family History: The patient's family history includes Alcoholism in her father; Asthma in her brother and sister; Diabetes in her brother; Heart attack in her brother; Stroke in her father and mother; Thyroid disease in her mother, sister, and sister.  ROS:   Please see the history of present illness.    (+) Palpitations All other systems reviewed and are negative.  EKGs/Labs/Other Studies Reviewed:    The following studies were reviewed today:  RLE Venous Doppler 07/06/2019: COMPARISON:  None.   FINDINGS: Contralateral Common Femoral Vein: Respiratory phasicity is normal and symmetric with the symptomatic side. No evidence of thrombus. Normal compressibility.   Common Femoral Vein: No evidence of thrombus. Normal compressibility, respiratory phasicity and response to augmentation.   Saphenofemoral Junction: No evidence of thrombus. Normal compressibility and flow on color Doppler imaging.   Profunda Femoral Vein: No evidence of thrombus. Normal compressibility and flow on color Doppler imaging.   Femoral Vein: No evidence of thrombus. Normal compressibility, respiratory phasicity and response to augmentation.   Popliteal Vein: No evidence of thrombus. Normal compressibility, respiratory phasicity and response to augmentation.   Calf Veins: No evidence of thrombus. Normal compressibility and flow on color Doppler imaging.   Superficial Great Saphenous Vein: No evidence of thrombus. Normal compressibility.   Venous Reflux:  None.   Other Findings:  None.   IMPRESSION: No evidence of DVT within the right lower extremity.  Atrial Fibrillation Ablation 04/30/2019: CONCLUSIONS: 1. Sinus rhythm upon presentation.   2. Intracardiac echo reveals a moderate sized left atrium with four separate pulmonary veins without  evidence of pulmonary vein stenosis. 3. Return of electrical activity within the left superior pulmonary vein.  The left inferior, right superior and right inferior pulmonary veins were quiescent from a prior ablation procedure at baseline.  There was some conduction noted along the carina between the RSPV and RIPV.    Successful re-isolation the left superior pulmonary vein.  Additional ablation was performed along the carina of the RSPV and RIPV.   4. Salvos of atrial flutter noted and successfully ablated from the mid posterior wall of the left atrium. 5. Additional ablation was performed along the posterior wall of the left atrium to complete the previously ablated posterior wall box.    6. Additional ablation performed at the SVC-RA junction 7. No early apparent complications.  Cardiac CTA 07/07/2018: IMPRESSION: 1. There is normal pulmonary vein drainage into the left atrium.   2. There is no thrombus in the left atrial appendage.   3. The esophagus runs in the left atrial midline and is not in the proximity to any of the pulmonary veins.   4. Dilated pulmonary artery measuring 36 mm suggestive of pulmonary hypertension.   5. Coronary Arteries: Normal coronary origin. Right dominance. The study was performed without use of NTG and insufficient for plaque evaluation. However, calcium score is 28 that represents 34 percentile for age/sex. There appears to be only mild non-obstructive CAD in the proximal LAD.  Echocardiogram 2018: - Left ventricle: The cavity size was normal. Wall thickness was    increased in a pattern of mild LVH. Systolic function was normal.    The estimated ejection fraction was in the range of 50% to 55%.    Wall motion was normal; there were no regional wall motion    abnormalities.  - Ascending aorta: The ascending aorta was mildly dilated.  - Mitral valve: Calcified annulus. There was mild regurgitation.  - Left atrium: The atrium was mildly dilated.  -  Pulmonary arteries: Systolic pressure was mildly increased. PA    peak pressure: 32 mm Hg (S).   Impressions:  - Low normal LV systolic function; mild LVH; mildly dilated    ascending aorta (40 mm); mild MR; mild LAE; trace TR with mildly    elevated pulmonary pressure.    Nuclear stress test 2016: no evidence of ischemia normal EF   EKG:  EKG is personally reviewed and interpreted. 09/11/2021:  EKG was not ordered  today.   Recent Labs: No results found for requested labs within last 8760 hours.   Recent Lipid Panel    Component Value Date/Time   CHOL 192 07/07/2017 0413   TRIG 66 07/07/2017 0413   HDL 55 07/07/2017 0413   CHOLHDL 3.5 07/07/2017 0413   VLDL 13 07/07/2017 0413   LDLCALC 124 (H) 07/07/2017 0413     Risk Assessment/Calculations:      Physical Exam:    VS:  BP 110/80   Pulse 70   Ht 5\' 1"  (1.549 m)   Wt 157 lb 11.2 oz (71.5 kg)   LMP  (LMP Unknown)   SpO2 95%   BMI 29.80 kg/m     Wt Readings from Last 3 Encounters:  09/11/21 157 lb 11.2 oz (71.5 kg)  06/07/21 155 lb (70.3 kg)  02/23/21 154 lb (69.9 kg)     GEN:  Well nourished, well developed in no acute distress HEENT: Normal NECK: No JVD; No carotid bruits LYMPHATICS: No lymphadenopathy CARDIAC: RRR, no murmurs, rubs, gallops RESPIRATORY:  Clear to auscultation without rales, wheezing or rhonchi  ABDOMEN: Soft, non-tender, non-distended MUSCULOSKELETAL:  No edema; No deformity  SKIN: Warm and dry NEUROLOGIC:  Alert and oriented x 3 PSYCHIATRIC:  Normal affect   ASSESSMENT:    1. Coronary artery calcification   2. Persistent atrial fibrillation ([place])   3. Mixed hyperlipidemia   4. Chronic anticoagulation     PLAN:    In order of problems listed above: Coronary artery calcification Calcium score was 28 previously on CT scan 2019.  Because of this, I would like for her LDL to be less than 70.  She is on maximally tolerated Crestor which is 5 mg 3 times a week.  We will add Zetia  10 mg once a day.  She is going to have her blood work done in February with Dr. Stephanie Acre  Persistent atrial fibrillation [facility]) Post ablation x2.  Off of antiarrhythmic therapy.  Taking diltiazem CD1 20 mg once a day.  She is also taking low-dose metoprolol 12.5 mg in the morning.  When she took this in the evening this may have contributed to vivid dreams.  CHA2DS2-VASc score is 6.  Continue with Eliquis.  She did feel some palpitations lasting about 10 minutes.  If this recurs, she will let us know.  I did state that she could take an additional metoprolol in this setting.  I will have her follow-up with Roderic Palau in 6 months just to make sure everything is going well.  She will see me back in 1 year.  Mixed hyperlipidemia Continue with maximal tolerated Crestor.  We are adding Zetia 10 mg once a day.  Coronary calcification.  Chronic anticoagulation Continue with Eliquis 5 mg twice a day for stroke prevention.  She has had a prior TIA.   Follow-up:   6 months with Roderic Palau, NP. 1 year with me.  Medication Adjustments/Labs and Tests Ordered: Current medicines are reviewed at length with the patient today.  Concerns regarding medicines are outlined above.  No orders of the defined types were placed in this encounter.  Meds ordered this encounter  Medications   metoprolol succinate (TOPROL-XL) 25 MG 24 hr tablet    Sig: TAKE 1/2 (ONE-HALF) TABLET BY MOUTH DAILY    Dispense:  45 tablet    Refill:  2   ezetimibe (ZETIA) 10 MG tablet    Sig: Take 1 tablet (10 mg total) by mouth daily.  Dispense:  90 tablet    Refill:  3     Patient Instructions  Medication Instructions:  Please start Zetia 10 mg daily. Continue all other medications as listed.  *If you need a refill on your cardiac medications before your next appointment, please call your pharmacy*  Follow-Up: At [facility], you and your health needs are our priority.  As part of our continuing mission to provide you  with exceptional heart care, we have created designated Provider Care Teams.  These Care Teams include your primary Cardiologist (physician) and Advanced Practice Providers (APPs -  Physician Assistants and Nurse Practitioners) who all work together to provide you with the care you need, when you need it.  We recommend signing up for the patient portal called "MyChart".  Sign up information is provided on this After Visit Summary.  MyChart is used to connect with patients for Virtual Visits (Telemedicine).  Patients are able to view lab/test results, encounter notes, upcoming appointments, etc.  Non-urgent messages can be sent to your provider as well.   To learn more about what you can do with MyChart, go to [email or web address].    Your next appointment:   6 month(s)  The format for your next appointment:   In Person  Provider:   You will follow up in the [facility] located at [facility]. Your provider will be: Roderic Palau, NP and   Dr Marlou Porch in 1 year.   Thank you for choosing [place]!!     I,Mathew Stumpf,acting as a scribe for Candee Furbish, MD.,have documented all relevant documentation on the behalf of Candee Furbish, MD,as directed by  Candee Furbish, MD while in the presence of Candee Furbish, MD.  I, Candee Furbish, MD, have reviewed all documentation for this visit. The documentation on 09/11/21 for the exam, diagnosis, procedures, and orders are all accurate and complete.   Signed, Candee Furbish, MD  09/11/2021 10:40 AM    [facility]

## 2021-09-11 NOTE — Assessment & Plan Note (Signed)
Continue with maximal tolerated Crestor.  We are adding Zetia 10 mg once a day.  Coronary calcification.

## 2021-10-27 DIAGNOSIS — H353113 Nonexudative age-related macular degeneration, right eye, advanced atrophic without subfoveal involvement: Secondary | ICD-10-CM | POA: Diagnosis not present

## 2021-10-27 DIAGNOSIS — H353221 Exudative age-related macular degeneration, left eye, with active choroidal neovascularization: Secondary | ICD-10-CM | POA: Diagnosis not present

## 2021-11-18 DIAGNOSIS — I270 Primary pulmonary hypertension: Secondary | ICD-10-CM | POA: Diagnosis not present

## 2021-11-18 DIAGNOSIS — I10 Essential (primary) hypertension: Secondary | ICD-10-CM | POA: Diagnosis not present

## 2021-11-18 DIAGNOSIS — I639 Cerebral infarction, unspecified: Secondary | ICD-10-CM | POA: Diagnosis not present

## 2021-11-18 DIAGNOSIS — K219 Gastro-esophageal reflux disease without esophagitis: Secondary | ICD-10-CM | POA: Diagnosis not present

## 2021-11-18 DIAGNOSIS — I4891 Unspecified atrial fibrillation: Secondary | ICD-10-CM | POA: Diagnosis not present

## 2021-11-18 DIAGNOSIS — I5032 Chronic diastolic (congestive) heart failure: Secondary | ICD-10-CM | POA: Diagnosis not present

## 2021-11-18 DIAGNOSIS — E782 Mixed hyperlipidemia: Secondary | ICD-10-CM | POA: Diagnosis not present

## 2021-11-18 DIAGNOSIS — E039 Hypothyroidism, unspecified: Secondary | ICD-10-CM | POA: Diagnosis not present

## 2021-11-18 DIAGNOSIS — M4712 Other spondylosis with myelopathy, cervical region: Secondary | ICD-10-CM | POA: Diagnosis not present

## 2021-12-12 DIAGNOSIS — I10 Essential (primary) hypertension: Secondary | ICD-10-CM | POA: Diagnosis not present

## 2021-12-12 DIAGNOSIS — I776 Arteritis, unspecified: Secondary | ICD-10-CM | POA: Diagnosis not present

## 2021-12-12 DIAGNOSIS — I4891 Unspecified atrial fibrillation: Secondary | ICD-10-CM | POA: Diagnosis not present

## 2021-12-15 ENCOUNTER — Other Ambulatory Visit (HOSPITAL_COMMUNITY): Payer: Self-pay | Admitting: *Deleted

## 2021-12-15 MED ORDER — APIXABAN 5 MG PO TABS: 5.0000 mg | ORAL_TABLET | Freq: Two times a day (BID) | ORAL | 2 refills | Status: DC

## 2022-01-03 DIAGNOSIS — L820 Inflamed seborrheic keratosis: Secondary | ICD-10-CM | POA: Diagnosis not present

## 2022-01-03 DIAGNOSIS — L720 Epidermal cyst: Secondary | ICD-10-CM | POA: Diagnosis not present

## 2022-01-03 DIAGNOSIS — D485 Neoplasm of uncertain behavior of skin: Secondary | ICD-10-CM | POA: Diagnosis not present

## 2022-01-03 DIAGNOSIS — Z85828 Personal history of other malignant neoplasm of skin: Secondary | ICD-10-CM | POA: Diagnosis not present

## 2022-01-03 DIAGNOSIS — C44612 Basal cell carcinoma of skin of right upper limb, including shoulder: Secondary | ICD-10-CM | POA: Diagnosis not present

## 2022-01-03 DIAGNOSIS — L570 Actinic keratosis: Secondary | ICD-10-CM | POA: Diagnosis not present

## 2022-01-03 DIAGNOSIS — L821 Other seborrheic keratosis: Secondary | ICD-10-CM | POA: Diagnosis not present

## 2022-01-11 DIAGNOSIS — C44612 Basal cell carcinoma of skin of right upper limb, including shoulder: Secondary | ICD-10-CM | POA: Diagnosis not present

## 2022-01-12 DIAGNOSIS — H26492 Other secondary cataract, left eye: Secondary | ICD-10-CM | POA: Diagnosis not present

## 2022-01-16 DIAGNOSIS — I5032 Chronic diastolic (congestive) heart failure: Secondary | ICD-10-CM | POA: Diagnosis not present

## 2022-01-16 DIAGNOSIS — K219 Gastro-esophageal reflux disease without esophagitis: Secondary | ICD-10-CM | POA: Diagnosis not present

## 2022-01-16 DIAGNOSIS — E782 Mixed hyperlipidemia: Secondary | ICD-10-CM | POA: Diagnosis not present

## 2022-01-16 DIAGNOSIS — I10 Essential (primary) hypertension: Secondary | ICD-10-CM | POA: Diagnosis not present

## 2022-01-16 DIAGNOSIS — E039 Hypothyroidism, unspecified: Secondary | ICD-10-CM | POA: Diagnosis not present

## 2022-01-16 DIAGNOSIS — I4891 Unspecified atrial fibrillation: Secondary | ICD-10-CM | POA: Diagnosis not present

## 2022-01-18 ENCOUNTER — Other Ambulatory Visit (HOSPITAL_COMMUNITY): Payer: Self-pay | Admitting: Internal Medicine

## 2022-01-19 NOTE — Telephone Encounter (Signed)
This is a A-Fib clinic pt 

## 2022-01-22 DIAGNOSIS — E559 Vitamin D deficiency, unspecified: Secondary | ICD-10-CM | POA: Diagnosis not present

## 2022-01-22 DIAGNOSIS — Z79899 Other long term (current) drug therapy: Secondary | ICD-10-CM | POA: Diagnosis not present

## 2022-01-22 DIAGNOSIS — E782 Mixed hyperlipidemia: Secondary | ICD-10-CM | POA: Diagnosis not present

## 2022-01-22 DIAGNOSIS — E039 Hypothyroidism, unspecified: Secondary | ICD-10-CM | POA: Diagnosis not present

## 2022-01-22 DIAGNOSIS — R7303 Prediabetes: Secondary | ICD-10-CM | POA: Diagnosis not present

## 2022-01-25 DIAGNOSIS — I5032 Chronic diastolic (congestive) heart failure: Secondary | ICD-10-CM | POA: Diagnosis not present

## 2022-01-25 DIAGNOSIS — I270 Primary pulmonary hypertension: Secondary | ICD-10-CM | POA: Diagnosis not present

## 2022-01-25 DIAGNOSIS — Z0000 Encounter for general adult medical examination without abnormal findings: Secondary | ICD-10-CM | POA: Diagnosis not present

## 2022-01-25 DIAGNOSIS — D692 Other nonthrombocytopenic purpura: Secondary | ICD-10-CM | POA: Diagnosis not present

## 2022-01-25 DIAGNOSIS — E039 Hypothyroidism, unspecified: Secondary | ICD-10-CM | POA: Diagnosis not present

## 2022-01-25 DIAGNOSIS — E782 Mixed hyperlipidemia: Secondary | ICD-10-CM | POA: Diagnosis not present

## 2022-01-25 DIAGNOSIS — I4891 Unspecified atrial fibrillation: Secondary | ICD-10-CM | POA: Diagnosis not present

## 2022-01-25 DIAGNOSIS — R7303 Prediabetes: Secondary | ICD-10-CM | POA: Diagnosis not present

## 2022-01-25 DIAGNOSIS — I77810 Thoracic aortic ectasia: Secondary | ICD-10-CM | POA: Diagnosis not present

## 2022-01-25 DIAGNOSIS — I10 Essential (primary) hypertension: Secondary | ICD-10-CM | POA: Diagnosis not present

## 2022-01-25 DIAGNOSIS — E559 Vitamin D deficiency, unspecified: Secondary | ICD-10-CM | POA: Diagnosis not present

## 2022-01-31 ENCOUNTER — Other Ambulatory Visit: Payer: Self-pay | Admitting: Family Medicine

## 2022-01-31 DIAGNOSIS — E2839 Other primary ovarian failure: Secondary | ICD-10-CM

## 2022-02-13 ENCOUNTER — Other Ambulatory Visit (HOSPITAL_COMMUNITY): Payer: Self-pay | Admitting: Cardiology

## 2022-02-13 NOTE — Telephone Encounter (Signed)
This is a [facility] pt. Pt was seen there, please address ?

## 2022-02-13 NOTE — Telephone Encounter (Signed)
[facility] pt.  ?

## 2022-02-15 ENCOUNTER — Other Ambulatory Visit: Payer: Self-pay | Admitting: Cardiology

## 2022-02-21 DIAGNOSIS — H353221 Exudative age-related macular degeneration, left eye, with active choroidal neovascularization: Secondary | ICD-10-CM | POA: Diagnosis not present

## 2022-02-26 ENCOUNTER — Other Ambulatory Visit (HOSPITAL_COMMUNITY): Payer: Self-pay | Admitting: Cardiology

## 2022-03-09 DIAGNOSIS — K219 Gastro-esophageal reflux disease without esophagitis: Secondary | ICD-10-CM | POA: Diagnosis not present

## 2022-03-09 DIAGNOSIS — E782 Mixed hyperlipidemia: Secondary | ICD-10-CM | POA: Diagnosis not present

## 2022-03-09 DIAGNOSIS — E039 Hypothyroidism, unspecified: Secondary | ICD-10-CM | POA: Diagnosis not present

## 2022-03-09 DIAGNOSIS — I10 Essential (primary) hypertension: Secondary | ICD-10-CM | POA: Diagnosis not present

## 2022-03-15 ENCOUNTER — Encounter (HOSPITAL_COMMUNITY): Payer: Self-pay | Admitting: Nurse Practitioner

## 2022-03-15 ENCOUNTER — Ambulatory Visit (HOSPITAL_COMMUNITY)
Admission: RE | Admit: 2022-03-15 | Discharge: 2022-03-15 | Disposition: A | Discharge status: Home / Self Care | Payer: Medicare Other | Source: Ambulatory Visit | Attending: Nurse Practitioner | Admitting: Nurse Practitioner

## 2022-03-15 VITALS — BP 112/60 | HR 64 | Ht 61.0 in | Wt 157.4 lb

## 2022-03-15 DIAGNOSIS — I4819 Other persistent atrial fibrillation: Secondary | ICD-10-CM | POA: Diagnosis not present

## 2022-03-15 DIAGNOSIS — Z79899 Other long term (current) drug therapy: Secondary | ICD-10-CM | POA: Insufficient documentation

## 2022-03-15 DIAGNOSIS — Z7901 Long term (current) use of anticoagulants: Secondary | ICD-10-CM | POA: Diagnosis not present

## 2022-03-15 DIAGNOSIS — D6869 Other thrombophilia: Secondary | ICD-10-CM | POA: Diagnosis not present

## 2022-03-15 NOTE — Progress Notes (Signed)
? ?Primary Care Physician: [name], MD ?Referring Physician: Dr. Rayann Heman ? ? ?Carolyn Vazquez is a 81 y.o. female with a h/o afib with h/o ablations x 2, last  one in June 2020. She is enjoying SR,  CHA2DS2VASc of at least 6. She reports today that she is having vivid dreams and is questioning if it could be any of her meds. BB may be the culprit. Will try to move to am than have her take in the pm.  ? ?F/u in afib clinic 03/15/22. She is doing great. No afib. The vivid dreams went away after she stopped lexapro.  ? ?Today, she denies symptoms of palpitations, chest pain, shortness of breath, orthopnea, PND, lower extremity edema, dizziness, presyncope, syncope, or neurologic sequela. The patient is tolerating medications without difficulties and is otherwise without complaint today.  ? ?Past Medical History:  ?Diagnosis Date  ? Chronic diastolic CHF (congestive heart failure) ([place])   ? Hyperlipidemia   ? Hypertension   ? Hypothyroidism   ? Mild dilation of ascending aorta (H[facility])   ? Persistent atrial fibrillation ([place])   ? a. dx 06/2017.  ? Stroke (cerebrum) ([place])   ? 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 Wegener's granulomatosis   ? ?Past Surgical History:  ?Procedure Laterality Date  ? ABDOMINAL HYSTERECTOMY  1979  ? ATRIAL FIBRILLATION ABLATION N/A 07/08/2018  ? Procedure: ATRIAL FIBRILLATION ABLATION;  Surgeon: Thompson Grayer, MD;  Location: [place] [facility];  Service: Cardiovascular;  Laterality: N/A;  ? ATRIAL FIBRILLATION ABLATION N/A 04/30/2019  ? Procedure: ATRIAL FIBRILLATION ABLATION;  Surgeon: Thompson Grayer, MD;  Location: [place] [facility];  Service: Cardiovascular;  Laterality: N/A;  ? BUNIONECTOMY Right   ? CARDIOVERSION N/A 08/05/2017  ? Procedure: CARDIOVERSION;  Surgeon: [name], MD;  Location: [facility] ENDOSCOPY;  Service: Cardiovascular;  Laterality: N/A;  ? CARDIOVERSION N/A 09/20/2017  ? Procedure: CARDIOVERSION;  Surgeon: [name], MD;  Location: [facility] ENDOSCOPY;   Service: Cardiovascular;  Laterality: N/A;  ? CARDIOVERSION N/A 08/21/2018  ? Procedure: CARDIOVERSION;  Surgeon: Thayer Headings, MD;  Location: [place];  Service: Cardiovascular;  Laterality: N/A;  ? CHOLECYSTECTOMY    ? LUNG SURGERY  2000's  ? both vasculitis  ? NASAL SINUS SURGERY    ? ? ?Current Outpatient Medications  ?Medication Sig Dispense Refill  ? ABREVA 10 % CREA Apply 1 application topically 2 (two) times daily as needed (cold sores.).    ? acetaminophen (TYLENOL) 500 MG tablet Take 1,000 mg by mouth 2 (two) times daily as needed for moderate pain or headache.    ? apixaban (ELIQUIS) 5 MG TABS tablet Take 1 tablet (5 mg total) by mouth 2 (two) times daily. 180 tablet 2  ? B Complex Vitamins (B-COMPLEX/B-12) TABS Take 1 tablet by mouth daily.     ? calcium elemental as carbonate (BARIATRIC TUMS ULTRA) 400 MG chewable tablet Chew 1,000 mg by mouth as needed for heartburn.     ? Coenzyme Q10 (COQ-10) 200 MG CAPS Take 200 mg by mouth 3 (three) times a week.     ? diltiazem (CARDIZEM CD) 120 MG 24 hr capsule Take 1 capsule by mouth twice daily 180 capsule 2  ? escitalopram (LEXAPRO) 10 MG tablet Take 10 mg by mouth daily. Per patient taking 1/2 tablet daily    ? ezetimibe (ZETIA) 10 MG tablet Take 1 tablet (10 mg total) by mouth daily. 90 tablet 3  ? fexofenadine (ALLEGRA) 180 MG  tablet Take 180 mg by mouth daily.    ? furosemide (LASIX) 40 MG tablet Take 1 tablet by mouth once daily 90 tablet 0  ? levothyroxine (SYNTHROID) 112 MCG tablet Take 112 mcg by mouth every morning.    ? losartan (COZAAR) 25 MG tablet Take 25 mg by mouth daily.    ? losartan (COZAAR) 25 MG tablet Take 1 tablet by mouth once daily 90 tablet 0  ? metoprolol succinate (TOPROL-XL) 25 MG 24 hr tablet TAKE 1/2 (ONE-HALF) TABLET BY MOUTH AT BEDTIME 45 tablet 0  ? Multiple Vitamins-Minerals (ICAPS AREDS FORMULA PO) Take 1 capsule by mouth 2 (two) times daily.    ? Polyvinyl Alcohol-Povidone (REFRESH OP) Place 1 drop into both eyes as  needed.     ? potassium chloride (KLOR-CON) 10 MEQ tablet Take 1 tablet by mouth twice daily 180 tablet 3  ? rosuvastatin (CRESTOR) 5 MG tablet TAKE 1 TABLET BY MOUTH EVERY MONDAY, WEDNESDAY, AND FRIDAY 45 tablet 3  ? sodium chloride (OCEAN) 0.65 % SOLN nasal spray Place 1 spray into both nostrils as needed for congestion.    ? Vitamin D, Cholecalciferol, 50 MCG (2000 UT) CAPS Take 1 capsule by mouth daily.    ? ?No current facility-administered medications for this encounter.  ? ? ?Allergies  ?Allergen Reactions  ? Codeine Other (See Comments)  ?  Patient isn't sure of reaction. Happens years ago - 49's  ? Amlodipine Besylate   ?  Other reaction(s): swelling  ? Atorvastatin Other (See Comments)  ?  Depression ?  ? Sulfa Antibiotics   ?  Other reaction(s): rash  ? Penicillins Rash  ?  Happened in 1960s ?Has patient had a PCN reaction causing immediate rash, facial/tongue/throat swelling, SOB or lightheadedness with hypotension: Unknown ?Has patient had a PCN reaction causing severe rash involving mucus membranes or skin necrosis: Unknown ?Has patient had a PCN reaction that required hospitalization: Unknown ?Has patient had a PCN reaction occurring within the last 10 years: No ?If all of the above answers are "NO", then may proceed with Cephalosporin use. ?  ? Simvastatin Other (See Comments)  ?  JOINT PAIN  ? ? ?Social History  ? ?Socioeconomic History  ? Marital status: Widowed  ?  Spouse name: Not on file  ? Number of children: Not on file  ? Years of education: Not on file  ? Highest education level: Not on file  ?Occupational History  ? Not on file  ?Tobacco Use  ? Smoking status: Never  ? Smokeless tobacco: Never  ?Vaping Use  ? Vaping Use: Never used  ?Substance and Sexual Activity  ? Alcohol use: Yes  ?  Alcohol/week: 1.0 - 2.0 standard drink  ?  Types: 1 - 2 Glasses of wine per week  ?  Comment: every day glass of wine  ? Drug use: No  ? Sexual activity: Not on file  ?Other Topics Concern  ? Not on file   ?Social History Narrative  ? Not on file  ? ?Social Determinants of Health  ? ?Financial Resource Strain: Not on file  ?Food Insecurity: Not on file  ?Transportation Needs: Not on file  ?Physical Activity: Not on file  ?Stress: Not on file  ?Social Connections: Not on file  ?Intimate Partner Violence: Not on file  ? ? ?Family History  ?Problem Relation Age of Onset  ? Stroke Mother   ? Thyroid disease Mother   ? Stroke Father   ? Alcoholism Father   ?  Asthma Sister   ? Thyroid disease Sister   ? Diabetes Brother   ? Thyroid disease Sister   ? Asthma Brother   ? Heart attack Brother   ? ? ?ROS- All systems are reviewed and negative except as per the HPI above ? ?Physical Exam: ?Vitals:  ? 03/15/22 1100  ?Height: '5\' 1"'$  (1.549 m)  ? ?Wt Readings from Last 3 Encounters:  ?09/11/21 71.5 kg  ?06/07/21 70.3 kg  ?02/23/21 69.9 kg  ? ? ?Labs: ?Lab Results  ?Component Value Date  ? NA 140 04/27/2019  ? K 4.1 04/27/2019  ? CL 98 04/27/2019  ? CO2 25 04/27/2019  ? GLUCOSE 138 (H) 04/27/2019  ? BUN 14 04/27/2019  ? CREATININE 0.93 04/27/2019  ? CALCIUM 9.4 04/27/2019  ? ?No results found for: INR ?Lab Results  ?Component Value Date  ? CHOL 192 07/07/2017  ? HDL 55 07/07/2017  ? LDLCALC 124 (H) 07/07/2017  ? TRIG 66 07/07/2017  ? ? ? ?GEN- The patient is well appearing, alert and oriented x 3 today.   ?Head- normocephalic, atraumatic ?Eyes-  Sclera clear, conjunctiva pink ?Ears- hearing intact ?Oropharynx- clear ?Neck- supple, no JVP ?Lymph- no cervical lymphadenopathy ?Lungs- Clear to ausculation bilaterally, normal work of breathing ?Heart- Regular rate and rhythm, no murmurs, rubs or gallops, PMI not laterally displaced ?GI- soft, NT, ND, + BS ?Extremities- no clubbing, cyanosis, or edema ?MS- no significant deformity or atrophy ?Skin- no rash or lesion ?Psych- euthymic mood, full affect ?Neuro- strength and sensation are intact ? ?EKG-Vent. rate 64 BPM ?PR interval 148 ms ?QRS duration 90 ms ?QT/QTcB 426/439 ms ?P-R-T axes  31 -5 12 ?Normal sinus rhythm ?Cannot rule out Anterior infarct , age undetermined ?Abnormal ECG ?When compared with ECG of 07-Jun-2021 14:04, ?PREVIOUS ECG IS PRESENT ? ? ?Assessment and Plan: ?1. [place]i

## 2022-03-30 ENCOUNTER — Telehealth (HOSPITAL_COMMUNITY): Payer: Self-pay

## 2022-03-30 NOTE — Telephone Encounter (Signed)
Eliquis patient assistance approve 03/30/2022-11/18/2022. ?

## 2022-05-09 ENCOUNTER — Other Ambulatory Visit (HOSPITAL_COMMUNITY): Payer: Self-pay | Admitting: Cardiology

## 2022-05-25 ENCOUNTER — Other Ambulatory Visit (HOSPITAL_COMMUNITY): Payer: Self-pay | Admitting: Cardiology

## 2022-06-06 ENCOUNTER — Other Ambulatory Visit: Payer: Self-pay | Admitting: Family Medicine

## 2022-06-06 DIAGNOSIS — H353221 Exudative age-related macular degeneration, left eye, with active choroidal neovascularization: Secondary | ICD-10-CM | POA: Diagnosis not present

## 2022-06-06 DIAGNOSIS — Z1231 Encounter for screening mammogram for malignant neoplasm of breast: Secondary | ICD-10-CM

## 2022-06-18 DIAGNOSIS — E782 Mixed hyperlipidemia: Secondary | ICD-10-CM | POA: Diagnosis not present

## 2022-06-18 DIAGNOSIS — K219 Gastro-esophageal reflux disease without esophagitis: Secondary | ICD-10-CM | POA: Diagnosis not present

## 2022-06-18 DIAGNOSIS — I10 Essential (primary) hypertension: Secondary | ICD-10-CM | POA: Diagnosis not present

## 2022-06-18 DIAGNOSIS — E039 Hypothyroidism, unspecified: Secondary | ICD-10-CM | POA: Diagnosis not present

## 2022-07-04 DIAGNOSIS — L821 Other seborrheic keratosis: Secondary | ICD-10-CM | POA: Diagnosis not present

## 2022-07-04 DIAGNOSIS — Z85828 Personal history of other malignant neoplasm of skin: Secondary | ICD-10-CM | POA: Diagnosis not present

## 2022-07-04 DIAGNOSIS — L820 Inflamed seborrheic keratosis: Secondary | ICD-10-CM | POA: Diagnosis not present

## 2022-07-04 DIAGNOSIS — D485 Neoplasm of uncertain behavior of skin: Secondary | ICD-10-CM | POA: Diagnosis not present

## 2022-07-04 DIAGNOSIS — L918 Other hypertrophic disorders of the skin: Secondary | ICD-10-CM | POA: Diagnosis not present

## 2022-07-04 DIAGNOSIS — L110 Acquired keratosis follicularis: Secondary | ICD-10-CM | POA: Diagnosis not present

## 2022-07-12 DIAGNOSIS — H9201 Otalgia, right ear: Secondary | ICD-10-CM | POA: Diagnosis not present

## 2022-07-12 DIAGNOSIS — L0291 Cutaneous abscess, unspecified: Secondary | ICD-10-CM | POA: Diagnosis not present

## 2022-07-30 DIAGNOSIS — G250 Essential tremor: Secondary | ICD-10-CM | POA: Diagnosis not present

## 2022-07-30 DIAGNOSIS — E039 Hypothyroidism, unspecified: Secondary | ICD-10-CM | POA: Diagnosis not present

## 2022-07-30 DIAGNOSIS — Z23 Encounter for immunization: Secondary | ICD-10-CM | POA: Diagnosis not present

## 2022-08-10 ENCOUNTER — Other Ambulatory Visit (HOSPITAL_BASED_OUTPATIENT_CLINIC_OR_DEPARTMENT_OTHER): Payer: Self-pay | Admitting: Cardiology

## 2022-08-27 ENCOUNTER — Other Ambulatory Visit (HOSPITAL_COMMUNITY): Payer: Self-pay | Admitting: Cardiology

## 2022-08-27 ENCOUNTER — Other Ambulatory Visit (HOSPITAL_COMMUNITY): Payer: Self-pay | Admitting: Nurse Practitioner

## 2022-08-29 DIAGNOSIS — H353221 Exudative age-related macular degeneration, left eye, with active choroidal neovascularization: Secondary | ICD-10-CM | POA: Diagnosis not present

## 2022-09-04 DIAGNOSIS — C44529 Squamous cell carcinoma of skin of other part of trunk: Secondary | ICD-10-CM | POA: Diagnosis not present

## 2022-09-04 DIAGNOSIS — L820 Inflamed seborrheic keratosis: Secondary | ICD-10-CM | POA: Diagnosis not present

## 2022-09-13 DIAGNOSIS — R110 Nausea: Secondary | ICD-10-CM | POA: Diagnosis not present

## 2022-09-17 ENCOUNTER — Ambulatory Visit: Payer: Medicare Other | Admitting: Cardiology

## 2022-10-22 ENCOUNTER — Other Ambulatory Visit (HOSPITAL_COMMUNITY): Payer: Self-pay | Admitting: Cardiology

## 2022-11-07 DIAGNOSIS — L820 Inflamed seborrheic keratosis: Secondary | ICD-10-CM | POA: Diagnosis not present

## 2022-11-07 DIAGNOSIS — L308 Other specified dermatitis: Secondary | ICD-10-CM | POA: Diagnosis not present

## 2022-11-07 DIAGNOSIS — Z85828 Personal history of other malignant neoplasm of skin: Secondary | ICD-10-CM | POA: Diagnosis not present

## 2022-11-07 DIAGNOSIS — L821 Other seborrheic keratosis: Secondary | ICD-10-CM | POA: Diagnosis not present

## 2022-11-09 ENCOUNTER — Other Ambulatory Visit (HOSPITAL_BASED_OUTPATIENT_CLINIC_OR_DEPARTMENT_OTHER): Payer: Self-pay | Admitting: Cardiology

## 2022-11-15 ENCOUNTER — Other Ambulatory Visit (HOSPITAL_BASED_OUTPATIENT_CLINIC_OR_DEPARTMENT_OTHER): Payer: Self-pay | Admitting: Cardiology

## 2022-11-16 ENCOUNTER — Ambulatory Visit
Admission: RE | Admit: 2022-11-16 | Discharge: 2022-11-16 | Disposition: A | Discharge status: Home / Self Care | Payer: Medicare Other | Source: Ambulatory Visit | Attending: Family Medicine | Admitting: Family Medicine

## 2022-11-16 DIAGNOSIS — Z1231 Encounter for screening mammogram for malignant neoplasm of breast: Secondary | ICD-10-CM

## 2022-11-16 DIAGNOSIS — M8589 Other specified disorders of bone density and structure, multiple sites: Secondary | ICD-10-CM | POA: Diagnosis not present

## 2022-11-16 DIAGNOSIS — Z780 Asymptomatic menopausal state: Secondary | ICD-10-CM | POA: Diagnosis not present

## 2022-11-16 DIAGNOSIS — E2839 Other primary ovarian failure: Secondary | ICD-10-CM

## 2022-11-28 DIAGNOSIS — K219 Gastro-esophageal reflux disease without esophagitis: Secondary | ICD-10-CM | POA: Diagnosis not present

## 2022-11-28 DIAGNOSIS — E039 Hypothyroidism, unspecified: Secondary | ICD-10-CM | POA: Diagnosis not present

## 2022-11-28 DIAGNOSIS — E782 Mixed hyperlipidemia: Secondary | ICD-10-CM | POA: Diagnosis not present

## 2022-11-28 DIAGNOSIS — I4891 Unspecified atrial fibrillation: Secondary | ICD-10-CM | POA: Diagnosis not present

## 2022-11-28 DIAGNOSIS — I5032 Chronic diastolic (congestive) heart failure: Secondary | ICD-10-CM | POA: Diagnosis not present

## 2022-11-28 DIAGNOSIS — I10 Essential (primary) hypertension: Secondary | ICD-10-CM | POA: Diagnosis not present

## 2022-11-28 DIAGNOSIS — H353221 Exudative age-related macular degeneration, left eye, with active choroidal neovascularization: Secondary | ICD-10-CM | POA: Diagnosis not present

## 2022-12-03 ENCOUNTER — Encounter: Payer: Self-pay | Admitting: Cardiology

## 2022-12-03 ENCOUNTER — Ambulatory Visit: Payer: Medicare Other | Attending: Cardiology | Admitting: Cardiology

## 2022-12-03 VITALS — BP 118/70 | HR 63 | Ht 61.0 in | Wt 156.0 lb

## 2022-12-03 DIAGNOSIS — I2510 Atherosclerotic heart disease of native coronary artery without angina pectoris: Secondary | ICD-10-CM

## 2022-12-03 DIAGNOSIS — I4819 Other persistent atrial fibrillation: Secondary | ICD-10-CM | POA: Diagnosis not present

## 2022-12-03 DIAGNOSIS — Z7901 Long term (current) use of anticoagulants: Secondary | ICD-10-CM

## 2022-12-03 DIAGNOSIS — I2584 Coronary atherosclerosis due to calcified coronary lesion: Secondary | ICD-10-CM | POA: Diagnosis not present

## 2022-12-03 DIAGNOSIS — Z8679 Personal history of other diseases of the circulatory system: Secondary | ICD-10-CM

## 2022-12-03 DIAGNOSIS — Z9889 Other specified postprocedural states: Secondary | ICD-10-CM | POA: Diagnosis not present

## 2022-12-03 MED ORDER — DILTIAZEM HCL ER COATED BEADS 120 MG PO CP24: 120.0000 mg | ORAL_CAPSULE | Freq: Two times a day (BID) | ORAL | 3 refills | Status: DC

## 2022-12-03 NOTE — Progress Notes (Signed)
Cardiology Office Note:    Date:  12/03/2022   ID:  Carolyn, Vazquez [birth year], MRN [id number]  PCP:  [name], MD   [place]  Cardiologist:  Candee Furbish, MD  Advanced Practice Provider:  No care team member to display Electrophysiologist:  Thompson Grayer, MD       Referring MD: [name], MD    History of Present Illness:    Carolyn Vazquez is a 82 y.o. female here for the follow-up of atrial fibrillation post ablation 04/2019.  She has had prior Wegener's vasculitis.  I saw her personally in 2019.  She has been seeing Roderic Palau and Dr. Rayann Heman since.  Carolyn Vazquez (06/20/1961) is her daughter, I take care of her.  Saw Dr. Rayann Heman on 12/14/2020.  Doing fairly well.  Rare palpitations and rare intermittent edema.  Coffee and pop tart in morning. Sit down in chair to read paper and feels a little light in chest and goes away. No chest pain, no shortness of breath.  Currently she is taking metoprolol in the morning due to side effects of nightmares when she was taking a PM dose.    Past Medical History:  Diagnosis Date   Chronic diastolic CHF (congestive heart failure) (HCC)    Hyperlipidemia    Hypertension    Hypothyroidism    Mild dilation of ascending aorta (HCC)    Persistent atrial fibrillation ([place])    a. dx 06/2017.   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 ([place])    Wegener's granulomatosis     Past Surgical History:  Procedure Laterality Date   ABDOMINAL HYSTERECTOMY  1979   ATRIAL FIBRILLATION ABLATION N/A 07/08/2018   Procedure: ATRIAL FIBRILLATION ABLATION;  Surgeon: Thompson Grayer, MD;  Location: [place] [facility];  Service: Cardiovascular;  Laterality: N/A;   ATRIAL FIBRILLATION ABLATION N/A 04/30/2019   Procedure: ATRIAL FIBRILLATION ABLATION;  Surgeon: Thompson Grayer, MD;  Location: [place] [facility];  Service: Cardiovascular;  Laterality: N/A;   BUNIONECTOMY Right    CARDIOVERSION  N/A 08/05/2017   Procedure: CARDIOVERSION;  Surgeon: [name], MD;  Location: [facility] ENDOSCOPY;  Service: Cardiovascular;  Laterality: N/A;   CARDIOVERSION N/A 09/20/2017   Procedure: CARDIOVERSION;  Surgeon: [name], MD;  Location: [facility] ENDOSCOPY;  Service: Cardiovascular;  Laterality: N/A;   CARDIOVERSION N/A 08/21/2018   Procedure: CARDIOVERSION;  Surgeon: Thayer Headings, MD;  Location: [facility] ENDOSCOPY;  Service: Cardiovascular;  Laterality: N/A;   CHOLECYSTECTOMY     LUNG SURGERY  2000's   both vasculitis   NASAL SINUS SURGERY      Current Medications: Current Meds  Medication Sig   ABREVA 10 % CREA Apply 1 application topically 2 (two) times daily as needed (cold sores.).   acetaminophen (TYLENOL) 500 MG tablet Take 1,000 mg by mouth 2 (two) times daily as needed for moderate pain or headache.   apixaban (ELIQUIS) 5 MG TABS tablet Take 1 tablet (5 mg total) by mouth 2 (two) times daily.   B Complex Vitamins (B-COMPLEX/B-12) TABS Take 1 tablet by mouth daily.    calcium elemental as carbonate (BARIATRIC TUMS ULTRA) 400 MG chewable tablet Chew 1,000 mg by mouth as needed for heartburn.    Coenzyme Q10 (COQ-10) 200 MG CAPS Take 200 mg by mouth 3 (three) times a week.    ezetimibe (ZETIA) 10 MG tablet Take 1 tablet by mouth once daily   fexofenadine (ALLEGRA) 180 MG  tablet Take 180 mg by mouth daily.   furosemide (LASIX) 40 MG tablet Take 1 tablet (40 mg total) by mouth daily.   levothyroxine (SYNTHROID) 112 MCG tablet Take 112 mcg by mouth every morning.   losartan (COZAAR) 25 MG tablet Take 1 tablet by mouth once daily   metoprolol succinate (TOPROL-XL) 25 MG 24 hr tablet TAKE 1/2 (ONE-HALF) TABLET BY MOUTH AT BEDTIME .   Multiple Vitamins-Minerals (ICAPS AREDS FORMULA PO) Take 1 capsule by mouth 2 (two) times daily.   Polyvinyl Alcohol-Povidone (REFRESH OP) Place 1 drop into both eyes as needed.    potassium chloride (KLOR-CON) 10 MEQ tablet Take 1 tablet by mouth twice  daily   rosuvastatin (CRESTOR) 5 MG tablet TAKE 1 TABLET BY MOUTH EVERY MONDAY, WEDNESDAY, AND FRIDAY   sodium chloride (OCEAN) 0.65 % SOLN nasal spray Place 1 spray into both nostrils as needed for congestion.   Vitamin D, Cholecalciferol, 50 MCG (2000 UT) CAPS Take 1 capsule by mouth daily.   [DISCONTINUED] diltiazem (CARDIZEM CD) 120 MG 24 hr capsule Take 1 capsule (120 mg total) by mouth 2 (two) times daily. Please keep scheduled appointment for future refills. Thank you.     Allergies:   Codeine, Amlodipine besylate, Atorvastatin, Hydrochlorothiazide, Lexapro [escitalopram], Sulfa antibiotics, Penicillins, and Simvastatin   Social History   Socioeconomic History   Marital status: Widowed    Spouse name: Not on file   Number of children: Not on file   Years of education: Not on file   Highest education level: Not on file  Occupational History   Not on file  Tobacco Use   Smoking status: Never   Smokeless tobacco: Never  Vaping Use   Vaping Use: Never used  Substance and Sexual Activity   Alcohol use: Yes    Alcohol/week: 1.0 - 2.0 standard drink of alcohol    Types: 1 - 2 Glasses of wine per week    Comment: every day glass of wine   Drug use: No   Sexual activity: Not on file  Other Topics Concern   Not on file  Social History Narrative   Not on file   Social Determinants of Health   Financial Resource Strain: Not on file  Food Insecurity: Not on file  Transportation Needs: Not on file  Physical Activity: Not on file  Stress: Not on file  Social Connections: Not on file     Family History: The patient's family history includes Alcoholism in her father; Asthma in her brother and sister; Diabetes in her brother; Heart attack in her brother; Stroke in her father and mother; Thyroid disease in her mother, sister, and sister.  ROS:   Please see the history of present illness.    (+) Palpitations All other systems reviewed and are negative.  EKGs/Labs/Other  Studies Reviewed:    The following studies were reviewed today:  Atrial Fibrillation Ablation 04/30/2019: CONCLUSIONS: 1. Sinus rhythm upon presentation.   2. Intracardiac echo reveals a moderate sized left atrium with four separate pulmonary veins without evidence of pulmonary vein stenosis. 3. Return of electrical activity within the left superior pulmonary vein.  The left inferior, right superior and right inferior pulmonary veins were quiescent from a prior ablation procedure at baseline.  There was some conduction noted along the carina between the RSPV and RIPV.    Successful re-isolation the left superior pulmonary vein.  Additional ablation was performed along the carina of the RSPV and RIPV.   4. Salvos of atrial  flutter noted and successfully ablated from the mid posterior wall of the left atrium. 5. Additional ablation was performed along the posterior wall of the left atrium to complete the previously ablated posterior wall box.    6. Additional ablation performed at the SVC-RA junction 7. No early apparent complications.  Cardiac CTA 07/07/2018: IMPRESSION: 1. There is normal pulmonary vein drainage into the left atrium.   2. There is no thrombus in the left atrial appendage.   3. The esophagus runs in the left atrial midline and is not in the proximity to any of the pulmonary veins.   4. Dilated pulmonary artery measuring 36 mm suggestive of pulmonary hypertension.   5. Coronary Arteries: Normal coronary origin. Right dominance. The study was performed without use of NTG and insufficient for plaque evaluation. However, calcium score is 28 that represents 34 percentile for age/sex. There appears to be only mild non-obstructive CAD in the proximal LAD.  Echocardiogram 2018: - Left ventricle: The cavity size was normal. Wall thickness was    increased in a pattern of mild LVH. Systolic function was normal.    The estimated ejection fraction was in the range of 50% to 55%.     Wall motion was normal; there were no regional wall motion    abnormalities.  - Ascending aorta: The ascending aorta was mildly dilated.  - Mitral valve: Calcified annulus. There was mild regurgitation.  - Left atrium: The atrium was mildly dilated.  - Pulmonary arteries: Systolic pressure was mildly increased. PA    peak pressure: 32 mm Hg (S).   Impressions:  - Low normal LV systolic function; mild LVH; mildly dilated    ascending aorta (40 mm); mild MR; mild LAE; trace TR with mildly    elevated pulmonary pressure.    Nuclear stress test 2016: no evidence of ischemia normal EF   EKG:  EKG is personally reviewed and interpreted. 12/03/2022: Sinus rhythm 63 no other abnormalities nonspecific ST-T wave changes. 09/11/2021:  EKG was not ordered today.   Recent Labs: No results found for requested labs within last 365 days.   Recent Lipid Panel    Component Value Date/Time   CHOL 192 07/07/2017 0413   TRIG 66 07/07/2017 0413   HDL 55 07/07/2017 0413   CHOLHDL 3.5 07/07/2017 0413   VLDL 13 07/07/2017 0413   LDLCALC 124 (H) 07/07/2017 0413     Risk Assessment/Calculations:      Physical Exam:    VS:  BP 118/70   Pulse 63   Ht '5\' 1"'$  (1.549 m)   Wt 156 lb (70.8 kg)   LMP  (LMP Unknown)   SpO2 98%   BMI 29.48 kg/m     Wt Readings from Last 3 Encounters:  12/03/22 156 lb (70.8 kg)  03/15/22 157 lb 6.4 oz (71.4 kg)  09/11/21 157 lb 11.2 oz (71.5 kg)     GEN:  Well nourished, well developed in no acute distress HEENT: Normal NECK: No JVD; No carotid bruits LYMPHATICS: No lymphadenopathy CARDIAC: RRR, no murmurs, rubs, gallops RESPIRATORY:  Clear to auscultation without rales, wheezing or rhonchi  ABDOMEN: Soft, non-tender, non-distended MUSCULOSKELETAL:  No edema; No deformity  SKIN: Warm and dry NEUROLOGIC:  Alert and oriented x 3 PSYCHIATRIC:  Normal affect   ASSESSMENT:    1. Persistent atrial fibrillation ([place])   2. Coronary artery calcification    3. Chronic anticoagulation   4. S/P ablation of atrial fibrillation      PLAN:  In order of problems listed above:  Coronary artery calcification Calcium score was 28 previously on CT scan 2019.  Because of this, I would like for her LDL to be less than 70.  She is close 77 last check from outside labs reviewed. She is on maximally tolerated Crestor which is 5 mg 3 times a week.  Zetia 10 mg once a day.    Persistent atrial fibrillation ([place]) Post ablation x2.  Off of antiarrhythmic therapy.  Taking diltiazem CD 120 mg once a day.  She is also taking low-dose metoprolol 12.5 mg in the morning.  When she took this in the evening this may have contributed to vivid dreams.  Continue with Eliquis.  She will see me back in 1 year.  Mixed hyperlipidemia Continue with maximal tolerated Crestor.  Zetia 10 mg once a day.  Coronary calcification.  Chronic anticoagulation CHA2DS2-VASc score is 6.   Continue with Eliquis 5 mg twice a day for stroke prevention.  She has had a prior TIA.   Follow-up:  1 year with me.  Medication Adjustments/Labs and Tests Ordered: Current medicines are reviewed at length with the patient today.  Concerns regarding medicines are outlined above.  Orders Placed This Encounter  Procedures   EKG 12-Lead    Meds ordered this encounter  Medications   diltiazem (CARDIZEM CD) 120 MG 24 hr capsule    Sig: Take 1 capsule (120 mg total) by mouth 2 (two) times daily.    Dispense:  180 capsule    Refill:  3     Patient Instructions  Medication Instructions:   Your physician recommends that you continue on your current medications as directed. Please refer to the Current Medication list given to you today.  *If you need a refill on your cardiac medications before your next appointment, please call your pharmacy*    Follow-Up: At [facility], you and your health needs are our priority.  As part of our continuing mission to provide you with  exceptional heart care, we have created designated Provider Care Teams.  These Care Teams include your primary Cardiologist (physician) and Advanced Practice Providers (APPs -  Physician Assistants and Nurse Practitioners) who all work together to provide you with the care you need, when you need it.  We recommend signing up for the patient portal called "MyChart".  Sign up information is provided on this After Visit Summary.  MyChart is used to connect with patients for Virtual Visits (Telemedicine).  Patients are able to view lab/test results, encounter notes, upcoming appointments, etc.  Non-urgent messages can be sent to your provider as well.   To learn more about what you can do with MyChart, go to [email or web address].    Your next appointment:   1 year(s)  Provider:   Candee Furbish, MD         Signed, Candee Furbish, MD  12/03/2022 10:24 AM    [place]

## 2022-12-03 NOTE — Patient Instructions (Signed)
Medication Instructions:   Your physician recommends that you continue on your current medications as directed. Please refer to the Current Medication list given to you today.  *If you need a refill on your cardiac medications before your next appointment, please call your pharmacy*    Follow-Up: At [facility], you and your health needs are our priority.  As part of our continuing mission to provide you with exceptional heart care, we have created designated Provider Care Teams.  These Care Teams include your primary Cardiologist (physician) and Advanced Practice Providers (APPs -  Physician Assistants and Nurse Practitioners) who all work together to provide you with the care you need, when you need it.  We recommend signing up for the patient portal called "MyChart".  Sign up information is provided on this After Visit Summary.  MyChart is used to connect with patients for Virtual Visits (Telemedicine).  Patients are able to view lab/test results, encounter notes, upcoming appointments, etc.  Non-urgent messages can be sent to your provider as well.   To learn more about what you can do with MyChart, go to [email or web address].    Your next appointment:   1 year(s)  Provider:   Candee Furbish, MD

## 2022-12-17 DIAGNOSIS — L821 Other seborrheic keratosis: Secondary | ICD-10-CM | POA: Diagnosis not present

## 2022-12-17 DIAGNOSIS — D2261 Melanocytic nevi of right upper limb, including shoulder: Secondary | ICD-10-CM | POA: Diagnosis not present

## 2022-12-17 DIAGNOSIS — D3617 Benign neoplasm of peripheral nerves and autonomic nervous system of trunk, unspecified: Secondary | ICD-10-CM | POA: Diagnosis not present

## 2022-12-17 DIAGNOSIS — L308 Other specified dermatitis: Secondary | ICD-10-CM | POA: Diagnosis not present

## 2022-12-17 DIAGNOSIS — D2271 Melanocytic nevi of right lower limb, including hip: Secondary | ICD-10-CM | POA: Diagnosis not present

## 2022-12-17 DIAGNOSIS — D2262 Melanocytic nevi of left upper limb, including shoulder: Secondary | ICD-10-CM | POA: Diagnosis not present

## 2022-12-17 DIAGNOSIS — D225 Melanocytic nevi of trunk: Secondary | ICD-10-CM | POA: Diagnosis not present

## 2022-12-17 DIAGNOSIS — L814 Other melanin hyperpigmentation: Secondary | ICD-10-CM | POA: Diagnosis not present

## 2022-12-17 DIAGNOSIS — D2272 Melanocytic nevi of left lower limb, including hip: Secondary | ICD-10-CM | POA: Diagnosis not present

## 2022-12-17 DIAGNOSIS — Z85828 Personal history of other malignant neoplasm of skin: Secondary | ICD-10-CM | POA: Diagnosis not present

## 2022-12-27 ENCOUNTER — Encounter (HOSPITAL_COMMUNITY): Payer: Self-pay | Admitting: *Deleted

## 2023-01-18 ENCOUNTER — Other Ambulatory Visit (HOSPITAL_COMMUNITY): Payer: Self-pay | Admitting: Nurse Practitioner

## 2023-01-18 DIAGNOSIS — I4819 Other persistent atrial fibrillation: Secondary | ICD-10-CM

## 2023-01-18 NOTE — Telephone Encounter (Signed)
Eliquis '5mg'$  refill request received. Patient is 82 years old, weight-70.8kg, Crea-0.83 on 01/22/2022 via [facility] from [place], [place], and last seen by Dr. Marlou Porch on 12/03/22. Dose is appropriate based on dosing criteria. Will send in refill to requested pharmacy.

## 2023-01-28 DIAGNOSIS — R7303 Prediabetes: Secondary | ICD-10-CM | POA: Diagnosis not present

## 2023-01-28 DIAGNOSIS — D6869 Other thrombophilia: Secondary | ICD-10-CM | POA: Diagnosis not present

## 2023-01-28 DIAGNOSIS — Z79899 Other long term (current) drug therapy: Secondary | ICD-10-CM | POA: Diagnosis not present

## 2023-01-28 DIAGNOSIS — E039 Hypothyroidism, unspecified: Secondary | ICD-10-CM | POA: Diagnosis not present

## 2023-01-28 DIAGNOSIS — M3131 Wegener's granulomatosis with renal involvement: Secondary | ICD-10-CM | POA: Diagnosis not present

## 2023-01-28 DIAGNOSIS — Z0000 Encounter for general adult medical examination without abnormal findings: Secondary | ICD-10-CM | POA: Diagnosis not present

## 2023-01-28 DIAGNOSIS — I5032 Chronic diastolic (congestive) heart failure: Secondary | ICD-10-CM | POA: Diagnosis not present

## 2023-01-28 DIAGNOSIS — E782 Mixed hyperlipidemia: Secondary | ICD-10-CM | POA: Diagnosis not present

## 2023-01-28 DIAGNOSIS — E559 Vitamin D deficiency, unspecified: Secondary | ICD-10-CM | POA: Diagnosis not present

## 2023-01-28 DIAGNOSIS — I10 Essential (primary) hypertension: Secondary | ICD-10-CM | POA: Diagnosis not present

## 2023-01-28 DIAGNOSIS — I4891 Unspecified atrial fibrillation: Secondary | ICD-10-CM | POA: Diagnosis not present

## 2023-01-28 DIAGNOSIS — I77810 Thoracic aortic ectasia: Secondary | ICD-10-CM | POA: Diagnosis not present

## 2023-01-28 LAB — LAB REPORT - SCANNED
A1c: 6
EGFR: 72

## 2023-01-29 ENCOUNTER — Telehealth (HOSPITAL_COMMUNITY): Payer: Self-pay | Admitting: *Deleted

## 2023-01-29 NOTE — Telephone Encounter (Signed)
Patient approved for eliquis patient assistance thrugh 11/19/23. Application number is [id number]

## 2023-01-30 DIAGNOSIS — R051 Acute cough: Secondary | ICD-10-CM | POA: Diagnosis not present

## 2023-02-07 ENCOUNTER — Other Ambulatory Visit (HOSPITAL_BASED_OUTPATIENT_CLINIC_OR_DEPARTMENT_OTHER): Payer: Self-pay | Admitting: Cardiology

## 2023-02-20 DIAGNOSIS — H353221 Exudative age-related macular degeneration, left eye, with active choroidal neovascularization: Secondary | ICD-10-CM | POA: Diagnosis not present

## 2023-02-21 ENCOUNTER — Other Ambulatory Visit (HOSPITAL_BASED_OUTPATIENT_CLINIC_OR_DEPARTMENT_OTHER): Payer: Self-pay | Admitting: Cardiology

## 2023-03-14 DIAGNOSIS — Z03818 Encounter for observation for suspected exposure to other biological agents ruled out: Secondary | ICD-10-CM | POA: Diagnosis not present

## 2023-03-14 DIAGNOSIS — R059 Cough, unspecified: Secondary | ICD-10-CM | POA: Diagnosis not present

## 2023-03-14 DIAGNOSIS — J988 Other specified respiratory disorders: Secondary | ICD-10-CM | POA: Diagnosis not present

## 2023-05-08 DIAGNOSIS — L821 Other seborrheic keratosis: Secondary | ICD-10-CM | POA: Diagnosis not present

## 2023-05-08 DIAGNOSIS — Z85828 Personal history of other malignant neoplasm of skin: Secondary | ICD-10-CM | POA: Diagnosis not present

## 2023-05-08 DIAGNOSIS — C44319 Basal cell carcinoma of skin of other parts of face: Secondary | ICD-10-CM | POA: Diagnosis not present

## 2023-05-08 DIAGNOSIS — D485 Neoplasm of uncertain behavior of skin: Secondary | ICD-10-CM | POA: Diagnosis not present

## 2023-05-08 DIAGNOSIS — L820 Inflamed seborrheic keratosis: Secondary | ICD-10-CM | POA: Diagnosis not present

## 2023-05-08 DIAGNOSIS — L570 Actinic keratosis: Secondary | ICD-10-CM | POA: Diagnosis not present

## 2023-05-08 DIAGNOSIS — L720 Epidermal cyst: Secondary | ICD-10-CM | POA: Diagnosis not present

## 2023-05-08 DIAGNOSIS — L298 Other pruritus: Secondary | ICD-10-CM | POA: Diagnosis not present

## 2023-05-14 DIAGNOSIS — I10 Essential (primary) hypertension: Secondary | ICD-10-CM | POA: Diagnosis not present

## 2023-05-14 DIAGNOSIS — I776 Arteritis, unspecified: Secondary | ICD-10-CM | POA: Diagnosis not present

## 2023-05-14 DIAGNOSIS — I4891 Unspecified atrial fibrillation: Secondary | ICD-10-CM | POA: Diagnosis not present

## 2023-05-22 ENCOUNTER — Other Ambulatory Visit (HOSPITAL_COMMUNITY): Payer: Self-pay | Admitting: *Deleted

## 2023-05-22 MED ORDER — POTASSIUM CHLORIDE ER 10 MEQ PO TBCR: 10.0000 meq | EXTENDED_RELEASE_TABLET | Freq: Two times a day (BID) | ORAL | 2 refills | Status: DC

## 2023-05-27 ENCOUNTER — Other Ambulatory Visit (HOSPITAL_COMMUNITY): Payer: Self-pay | Admitting: *Deleted

## 2023-05-27 MED ORDER — POTASSIUM CHLORIDE ER 10 MEQ PO TBCR: 10.0000 meq | EXTENDED_RELEASE_TABLET | Freq: Two times a day (BID) | ORAL | 2 refills | Status: DC

## 2023-05-29 DIAGNOSIS — H353221 Exudative age-related macular degeneration, left eye, with active choroidal neovascularization: Secondary | ICD-10-CM | POA: Diagnosis not present

## 2023-06-10 ENCOUNTER — Other Ambulatory Visit (HOSPITAL_COMMUNITY): Payer: Self-pay | Admitting: *Deleted

## 2023-06-10 DIAGNOSIS — I4819 Other persistent atrial fibrillation: Secondary | ICD-10-CM

## 2023-06-11 DIAGNOSIS — C44319 Basal cell carcinoma of skin of other parts of face: Secondary | ICD-10-CM | POA: Diagnosis not present

## 2023-06-11 DIAGNOSIS — L570 Actinic keratosis: Secondary | ICD-10-CM | POA: Diagnosis not present

## 2023-07-03 DIAGNOSIS — H353113 Nonexudative age-related macular degeneration, right eye, advanced atrophic without subfoveal involvement: Secondary | ICD-10-CM | POA: Diagnosis not present

## 2023-07-16 ENCOUNTER — Other Ambulatory Visit (HOSPITAL_COMMUNITY): Payer: Self-pay | Admitting: Cardiology

## 2023-07-24 DIAGNOSIS — Z23 Encounter for immunization: Secondary | ICD-10-CM | POA: Diagnosis not present

## 2023-07-29 DIAGNOSIS — R319 Hematuria, unspecified: Secondary | ICD-10-CM | POA: Diagnosis not present

## 2023-08-14 ENCOUNTER — Ambulatory Visit: Payer: Medicare Other | Attending: Cardiology | Admitting: Cardiology

## 2023-08-14 ENCOUNTER — Encounter: Payer: Self-pay | Admitting: Cardiology

## 2023-08-14 VITALS — BP 120/74 | HR 68 | Ht 61.0 in | Wt 150.0 lb

## 2023-08-14 DIAGNOSIS — I4819 Other persistent atrial fibrillation: Secondary | ICD-10-CM | POA: Diagnosis not present

## 2023-08-14 DIAGNOSIS — D6869 Other thrombophilia: Secondary | ICD-10-CM | POA: Diagnosis not present

## 2023-08-14 NOTE — Progress Notes (Signed)
Electrophysiology Office Note:   Date:  08/14/2023  ID:  Jessa, Basore Jun 05, 1941, MRN [id number]  Primary Cardiologist: Donato Schultz, MD Electrophysiologist: Hillis Range, MD (Inactive)      History of Present Illness:   Carolyn Vazquez is a 82 y.o. female with h/o atrial fibrillation, hypertension, hyperlipidemia, chronic diastolic heart failure, CVA, Wegener's granulomatosis seen today for routine electrophysiology followup.   Since last being seen in our clinic the patient reports doing overall well..  She is post atrial fibrillation ablation x 2, last June 2020.  She has had no further episodes of atrial fibrillation.  She has been able to do all of her daily activities without restriction.  She has no chest pain, but does have some mild shortness of breath.  She is on a diuretic per her primary cardiologist.  she denies chest pain, palpitations, dyspnea, PND, orthopnea, nausea, vomiting, dizziness, syncope, edema, weight gain, or early satiety.   Review of systems complete and found to be negative unless listed in HPI.   EP Information / Studies Reviewed:    EKG is ordered today. Personal review as below.  EKG Interpretation Date/Time:  Wednesday August 14 2023 12:07:56 EDT Ventricular Rate:  68 PR Interval:  144 QRS Duration:  84 QT Interval:  402 QTC Calculation: 427 R Axis:   31  Text Interpretation: Normal sinus rhythm Nonspecific ST abnormality When compared with ECG of 15-Mar-2022 11:14, No significant change was found Confirmed by Karely Hurtado ([id number]) on 08/14/2023 12:15:03 PM     Risk Assessment/Calculations:    CHA2DS2-VASc Score = 6   This indicates a 9.7% annual risk of stroke. The patient's score is based upon: CHF History: 0 HTN History: 1 Diabetes History: 0 Stroke History: 2 Vascular Disease History: 0 Age Score: 2 Gender Score: 1             Physical Exam:   VS:  BP 120/74 (BP Location: Left Arm, Patient Position: Sitting, Cuff Size:  Normal)   Pulse 68   Ht 5\' 1"  (1.549 m)   Wt 150 lb (68 kg)   LMP  (LMP Unknown)   SpO2 97%   BMI 28.34 kg/m    Wt Readings from Last 3 Encounters:  08/14/23 150 lb (68 kg)  12/03/22 156 lb (70.8 kg)  03/15/22 157 lb 6.4 oz (71.4 kg)     GEN: Well nourished, well developed in no acute distress NECK: No JVD; No carotid bruits CARDIAC: Regular rate and rhythm, no murmurs, rubs, gallops RESPIRATORY:  Clear to auscultation without rales, wheezing or rhonchi  ABDOMEN: Soft, non-tender, non-distended EXTREMITIES:  No edema; No deformity   ASSESSMENT AND PLAN:    1.  Persistent atrial fibrillation: Status post ablation x 2, most recently in 2020.  She is currently feeling well.  She has had no recurrence of her atrial fibrillation.  Azuree Minish continue with current management.  2.  Secondary hypercoagulable state: Currently on Eliquis for atrial fibrillation  3.  Coronary artery calcifications: Plan per primary cardiology.  No current chest pain.  Follow up with Afib Clinic in 6 months  Signed, Drystan Reader Jorja Loa, MD

## 2023-08-14 NOTE — Patient Instructions (Signed)
Medication Instructions:  Your physician recommends that you continue on your current medications as directed. Please refer to the Current Medication list given to you today.  *If you need a refill on your cardiac medications before your next appointment, please call your pharmacy*   Lab Work: None ordered   Testing/Procedures: None ordered   Follow-Up: At [facility], you and your health needs are our priority.  As part of our continuing mission to provide you with exceptional heart care, we have created designated Provider Care Teams.  These Care Teams include your primary Cardiologist (physician) and Advanced Practice Providers (APPs -  Physician Assistants and Nurse Practitioners) who all work together to provide you with the care you need, when you need it.  We recommend signing up for the patient portal called "MyChart".  Sign up information is provided on this After Visit Summary.  MyChart is used to connect with patients for Virtual Visits (Telemedicine).  Patients are able to view lab/test results, encounter notes, upcoming appointments, etc.  Non-urgent messages can be sent to your provider as well.   To learn more about what you can do with MyChart, go to [email or web address].    Your next appointment:   6 month(s)  The format for your next appointment:   In Person  Provider:   You will follow up in the Atrial Fibrillation Clinic located at [facility]. Your provider will be: Clint R. Fenton, PA-C  Or Landry Mellow, PA-C  Thank you for choosing [facility]!!   Dory Horn, RN [phone]

## 2023-08-28 DIAGNOSIS — H353113 Nonexudative age-related macular degeneration, right eye, advanced atrophic without subfoveal involvement: Secondary | ICD-10-CM | POA: Diagnosis not present

## 2023-09-02 DIAGNOSIS — L820 Inflamed seborrheic keratosis: Secondary | ICD-10-CM | POA: Diagnosis not present

## 2023-09-02 DIAGNOSIS — C44729 Squamous cell carcinoma of skin of left lower limb, including hip: Secondary | ICD-10-CM | POA: Diagnosis not present

## 2023-09-04 DIAGNOSIS — H353221 Exudative age-related macular degeneration, left eye, with active choroidal neovascularization: Secondary | ICD-10-CM | POA: Diagnosis not present

## 2023-10-23 DIAGNOSIS — H353113 Nonexudative age-related macular degeneration, right eye, advanced atrophic without subfoveal involvement: Secondary | ICD-10-CM | POA: Diagnosis not present

## 2023-11-05 DIAGNOSIS — M4712 Other spondylosis with myelopathy, cervical region: Secondary | ICD-10-CM | POA: Diagnosis not present

## 2023-11-05 DIAGNOSIS — M48062 Spinal stenosis, lumbar region with neurogenic claudication: Secondary | ICD-10-CM | POA: Diagnosis not present

## 2023-11-27 DIAGNOSIS — H353221 Exudative age-related macular degeneration, left eye, with active choroidal neovascularization: Secondary | ICD-10-CM | POA: Diagnosis not present

## 2023-12-18 DIAGNOSIS — H353113 Nonexudative age-related macular degeneration, right eye, advanced atrophic without subfoveal involvement: Secondary | ICD-10-CM | POA: Diagnosis not present

## 2023-12-20 ENCOUNTER — Ambulatory Visit: Payer: Medicare Other | Attending: Cardiology | Admitting: Cardiology

## 2023-12-20 ENCOUNTER — Encounter: Payer: Self-pay | Admitting: Cardiology

## 2023-12-20 VITALS — BP 108/62 | HR 64 | Resp 16 | Ht 61.0 in | Wt 152.0 lb

## 2023-12-20 DIAGNOSIS — Z7901 Long term (current) use of anticoagulants: Secondary | ICD-10-CM

## 2023-12-20 DIAGNOSIS — I2510 Atherosclerotic heart disease of native coronary artery without angina pectoris: Secondary | ICD-10-CM

## 2023-12-20 DIAGNOSIS — I4819 Other persistent atrial fibrillation: Secondary | ICD-10-CM | POA: Diagnosis not present

## 2023-12-20 DIAGNOSIS — Z9889 Other specified postprocedural states: Secondary | ICD-10-CM | POA: Diagnosis not present

## 2023-12-20 DIAGNOSIS — E782 Mixed hyperlipidemia: Secondary | ICD-10-CM | POA: Diagnosis not present

## 2023-12-20 DIAGNOSIS — Z8679 Personal history of other diseases of the circulatory system: Secondary | ICD-10-CM

## 2023-12-20 NOTE — Progress Notes (Signed)
Cardiology Office Note:  .   Date:  12/20/2023  ID:  Carolyn Vazquez, DOB 1941/03/24, MRN [id number] PCP: Mila Palmer, MD  [facility] Providers Cardiologist:  Donato Schultz, MD Electrophysiologist:  Hillis Range, MD (Inactive)     History of Present Illness: .   Carolyn Vazquez is a 83 y.o. female Discussed with the use of AI scribe   History of Present Illness   The patient is an 83 year old female with atrial fibrillation (post ablation), hypertension, hyperlipidemia, chronic diastolic heart failure, prior stroke, and Wagner's granulomatosis who presents for follow-up.  She has a history of atrial fibrillation and has undergone two ablations, the last one in June 2020. Since then, she has not experienced any further episodes and remains in normal sinus rhythm. She is on chronic anticoagulation with Eliquis 5 mg twice daily and has not had any bleeding issues. Her prior EKG showed normal sinus rhythm with nonspecific STT wave changes and a ventricular rate of 68 beats per minute.  Her blood pressure is slightly low at 108/62 mmHg. She is on losartan 25 mg daily and metoprolol 12.5 mg in the morning, which are effectively managing her blood pressure. She experiences some 'fuzzy' eyesight, which may be related to her blood pressure.  She has a history of coronary artery disease with some coronary calcification. She is on Zetia and Crestor 5 mg on Monday, Wednesday, and Friday for hyperlipidemia. Her LDL is well controlled at 70 mg/dL.  She experiences mild shortness of breath, which is a chronic issue related to her diastolic heart failure. She is on furosemide for diuresis, which helps with her symptoms.  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 The injections have been more comfortable recently, with less pain reported during the procedure.  She reports a tremor that 'comes and goes,' which was  diagnosed as an essential tremor years ago. It has not been further investigated or treated.  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 She had to stop working due to [employer] granulomatosis.         Studies Reviewed: .        Results   LABS Hb: 13.3 g/dL Cr: 0.9 mg/dL LDL: 70 mg/dL  RADIOLOGY Coronary artery calcification: Presence of coronary calcification and plaque  DIAGNOSTIC EKG: Normal sinus rhythm with nonspecific ST-T wave changes     Risk Assessment/Calculations:            Physical Exam:   VS:  BP 108/62 (BP Location: Left Arm, Patient Position: Sitting, Cuff Size: Small)   Pulse 64   Resp 16   Ht 5\' 1"  (1.549 m)   Wt 152 lb (68.9 kg)   LMP  (LMP Unknown)   SpO2 96%   BMI 28.72 kg/m    Wt Readings from Last 3 Encounters:  12/20/23 152 lb (68.9 kg)  08/14/23 150 lb (68 kg)  12/03/22 156 lb (70.8 kg)    GEN: Well nourished, well developed in no acute distress NECK: No JVD; No carotid bruits CARDIAC: RRR, no murmurs, no rubs, no gallops RESPIRATORY:  Clear to auscultation without rales, wheezing or rhonchi  ABDOMEN: Soft, non-tender, non-distended EXTREMITIES:  No edema; No deformity   ASSESSMENT AND PLAN: .    Assessment and Plan    Atrial Fibrillation Atrial fibrillation with two ablations, last in June 2020. No further episodes. On Eliquis 5 mg BID  for anticoagulation. CHADS2-VASc score 6. Recent EKG: normal sinus rhythm, nonspecific ST-T wave changes, ventricular rate 68 bpm. No bleeding. No further follow-up with AFib clinic or electrophysiologist needed unless new symptoms arise. - Continue Eliquis 5 mg BID - No follow-up with AFib clinic or electrophysiologist unless new symptoms arise - Schedule follow-up in one year  Chronic Diastolic Heart Failure Mild shortness of breath. On furosemide for fluid management. Current treatment effective. - Continue furosemide as  prescribed  Hypertension Blood pressure 108/62 mmHg. Mild shortness of breath and occasional dizziness. On losartan 25 mg daily and metoprolol 12.5 mg daily. Current blood pressure not dangerous. - Continue losartan 25 mg daily - Continue metoprolol 12.5 mg daily  Hyperlipidemia LDL 70 mg/dL. On Zetia and Crestor 5 mg Monday, Wednesday, and Friday. Coronary calcification noted. Zetia effective in blocking fat absorption. 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 Recent injection with thicker medication caused temporary haziness. No significant pain. Injections necessary for management. - Continue regular injections as scheduled by ophthalmologist  Essential Tremor Long-standing essential tremor, evaluated at [facility]. Symptoms managed without specific treatment. - Monitor symptoms  General Health Maintenance Hemoglobin 13.3 g/dL, creatinine 0.9 mg/dL, indicating normal blood count and kidney function. - Continue current medications and lifestyle  Follow-up - Schedule follow-up in one year.               Signed, Donato Schultz, MD

## 2023-12-20 NOTE — Patient Instructions (Signed)
 Medication Instructions:  The current medical regimen is effective;  continue present plan and medications.  *If you need a refill on your cardiac medications before your next appointment, please call your pharmacy*  Follow-Up: At [facility], you and your health needs are our priority.  As part of our continuing mission to provide you with exceptional heart care, we have created designated Provider Care Teams.  These Care Teams include your primary Cardiologist (physician) and Advanced Practice Providers (APPs -  Physician Assistants and Nurse Practitioners) who all work together to provide you with the care you need, when you need it.  We recommend signing up for the patient portal called "MyChart".  Sign up information is provided on this After Visit Summary.  MyChart is used to connect with patients for Virtual Visits (Telemedicine).  Patients are able to view lab/test results, encounter notes, upcoming appointments, etc.  Non-urgent messages can be sent to your provider as well.   To learn more about what you can do with MyChart, go to [email or web address].    Your next appointment:   1 year(s)  Provider:   Donato Schultz, MD

## 2023-12-23 ENCOUNTER — Other Ambulatory Visit: Payer: Self-pay | Admitting: Family Medicine

## 2023-12-23 DIAGNOSIS — Z1231 Encounter for screening mammogram for malignant neoplasm of breast: Secondary | ICD-10-CM

## 2023-12-23 DIAGNOSIS — Z13820 Encounter for screening for osteoporosis: Secondary | ICD-10-CM

## 2023-12-26 DIAGNOSIS — M545 Low back pain, unspecified: Secondary | ICD-10-CM | POA: Diagnosis not present

## 2023-12-26 DIAGNOSIS — M7062 Trochanteric bursitis, left hip: Secondary | ICD-10-CM | POA: Diagnosis not present

## 2023-12-26 DIAGNOSIS — M7061 Trochanteric bursitis, right hip: Secondary | ICD-10-CM | POA: Diagnosis not present

## 2024-01-12 ENCOUNTER — Other Ambulatory Visit: Payer: Self-pay | Admitting: Cardiology

## 2024-01-12 DIAGNOSIS — I2510 Atherosclerotic heart disease of native coronary artery without angina pectoris: Secondary | ICD-10-CM

## 2024-01-14 DIAGNOSIS — M7061 Trochanteric bursitis, right hip: Secondary | ICD-10-CM | POA: Diagnosis not present

## 2024-01-15 DIAGNOSIS — H353221 Exudative age-related macular degeneration, left eye, with active choroidal neovascularization: Secondary | ICD-10-CM | POA: Diagnosis not present

## 2024-01-23 DIAGNOSIS — L720 Epidermal cyst: Secondary | ICD-10-CM | POA: Diagnosis not present

## 2024-01-23 DIAGNOSIS — D1801 Hemangioma of skin and subcutaneous tissue: Secondary | ICD-10-CM | POA: Diagnosis not present

## 2024-01-23 DIAGNOSIS — L853 Xerosis cutis: Secondary | ICD-10-CM | POA: Diagnosis not present

## 2024-01-23 DIAGNOSIS — Z85828 Personal history of other malignant neoplasm of skin: Secondary | ICD-10-CM | POA: Diagnosis not present

## 2024-01-23 DIAGNOSIS — L814 Other melanin hyperpigmentation: Secondary | ICD-10-CM | POA: Diagnosis not present

## 2024-01-23 DIAGNOSIS — L821 Other seborrheic keratosis: Secondary | ICD-10-CM | POA: Diagnosis not present

## 2024-01-23 DIAGNOSIS — L570 Actinic keratosis: Secondary | ICD-10-CM | POA: Diagnosis not present

## 2024-01-23 DIAGNOSIS — D485 Neoplasm of uncertain behavior of skin: Secondary | ICD-10-CM | POA: Diagnosis not present

## 2024-01-27 ENCOUNTER — Other Ambulatory Visit (HOSPITAL_COMMUNITY): Payer: Self-pay | Admitting: Cardiology

## 2024-01-27 DIAGNOSIS — I4819 Other persistent atrial fibrillation: Secondary | ICD-10-CM

## 2024-01-28 DIAGNOSIS — M545 Low back pain, unspecified: Secondary | ICD-10-CM | POA: Diagnosis not present

## 2024-01-28 DIAGNOSIS — M25511 Pain in right shoulder: Secondary | ICD-10-CM | POA: Diagnosis not present

## 2024-01-28 DIAGNOSIS — M7061 Trochanteric bursitis, right hip: Secondary | ICD-10-CM | POA: Diagnosis not present

## 2024-01-29 ENCOUNTER — Telehealth: Payer: Self-pay | Admitting: Cardiology

## 2024-01-29 NOTE — Telephone Encounter (Signed)
 Pt c/o medication issue:  1. Name of Medication:  apixaban (ELIQUIS) 5 MG TABS tablet  2. How are you currently taking this medication (dosage and times per day)?   3. Are you having a reaction (difficulty breathing--STAT)?   4. What is your medication issue?   Patient states Eliquis is too expensive, but she doesn't qualify for patient assistance this year. She would like to discuss other options if possible. Please advise.

## 2024-01-29 NOTE — Telephone Encounter (Signed)
 Prescription refill request for Eliquis received. Indication: Afib  Last office visit: 12/20/23 Anne Fu)  Scr: 0.79 (05/14/23)  Age: 83 Weight: 68.9kg  Appropriate dose. Refill sent.

## 2024-01-29 NOTE — Telephone Encounter (Signed)
 Spoke with pt regarding her concerns about Eliquis. Pt stated it is too expensive for her now and would like to discuss other options. Coumadin was discussed as an option, however the pt cannot drive and doesn't want to rely on others to drive her to have recurrent lab checks. Pt does not qualify for any coupons as she has Medicare. Pt was told that Dr. Anne Fu and his nurse would be notified, along with the pharmacist. Pt verbalized understanding. All questions, if any, were answered.

## 2024-01-30 ENCOUNTER — Other Ambulatory Visit (HOSPITAL_COMMUNITY): Payer: Self-pay

## 2024-01-31 NOTE — Telephone Encounter (Signed)
 Spoke with pt, aware of the pharmacist recommendations. She has gotten the meds and has meet her deductible. [name]

## 2024-01-31 NOTE — Telephone Encounter (Signed)
 She has a $255 deductible (so her first fill will be 396 of she gets a 90 day supply or 302 for a 30 day supply). Once that is paid her cost will be $141/ 90 days (47/month). Her other option is dabigatran using a good rx coupon at [employer] for ~ $65/month

## 2024-02-06 DIAGNOSIS — I77810 Thoracic aortic ectasia: Secondary | ICD-10-CM | POA: Diagnosis not present

## 2024-02-06 DIAGNOSIS — I270 Primary pulmonary hypertension: Secondary | ICD-10-CM | POA: Diagnosis not present

## 2024-02-06 DIAGNOSIS — E559 Vitamin D deficiency, unspecified: Secondary | ICD-10-CM | POA: Diagnosis not present

## 2024-02-06 DIAGNOSIS — I5032 Chronic diastolic (congestive) heart failure: Secondary | ICD-10-CM | POA: Diagnosis not present

## 2024-02-06 DIAGNOSIS — E782 Mixed hyperlipidemia: Secondary | ICD-10-CM | POA: Diagnosis not present

## 2024-02-06 DIAGNOSIS — M3131 Wegener's granulomatosis with renal involvement: Secondary | ICD-10-CM | POA: Diagnosis not present

## 2024-02-06 DIAGNOSIS — G250 Essential tremor: Secondary | ICD-10-CM | POA: Diagnosis not present

## 2024-02-06 DIAGNOSIS — Z0000 Encounter for general adult medical examination without abnormal findings: Secondary | ICD-10-CM | POA: Diagnosis not present

## 2024-02-06 DIAGNOSIS — Z79899 Other long term (current) drug therapy: Secondary | ICD-10-CM | POA: Diagnosis not present

## 2024-02-06 DIAGNOSIS — E039 Hypothyroidism, unspecified: Secondary | ICD-10-CM | POA: Diagnosis not present

## 2024-02-06 DIAGNOSIS — R7303 Prediabetes: Secondary | ICD-10-CM | POA: Diagnosis not present

## 2024-02-06 DIAGNOSIS — D6869 Other thrombophilia: Secondary | ICD-10-CM | POA: Diagnosis not present

## 2024-02-07 LAB — LAB REPORT - SCANNED
A1c: 6.3
EGFR: 59

## 2024-02-12 DIAGNOSIS — H353113 Nonexudative age-related macular degeneration, right eye, advanced atrophic without subfoveal involvement: Secondary | ICD-10-CM | POA: Diagnosis not present

## 2024-02-13 ENCOUNTER — Other Ambulatory Visit (HOSPITAL_BASED_OUTPATIENT_CLINIC_OR_DEPARTMENT_OTHER): Payer: Self-pay | Admitting: Cardiology

## 2024-02-26 DIAGNOSIS — I959 Hypotension, unspecified: Secondary | ICD-10-CM | POA: Diagnosis not present

## 2024-02-26 DIAGNOSIS — K529 Noninfective gastroenteritis and colitis, unspecified: Secondary | ICD-10-CM | POA: Diagnosis not present

## 2024-04-02 DIAGNOSIS — I776 Arteritis, unspecified: Secondary | ICD-10-CM | POA: Diagnosis not present

## 2024-04-02 DIAGNOSIS — I10 Essential (primary) hypertension: Secondary | ICD-10-CM | POA: Diagnosis not present

## 2024-04-08 DIAGNOSIS — H353221 Exudative age-related macular degeneration, left eye, with active choroidal neovascularization: Secondary | ICD-10-CM | POA: Diagnosis not present

## 2024-04-10 ENCOUNTER — Other Ambulatory Visit: Payer: Self-pay | Admitting: Cardiology

## 2024-04-10 DIAGNOSIS — I2510 Atherosclerotic heart disease of native coronary artery without angina pectoris: Secondary | ICD-10-CM

## 2024-04-15 DIAGNOSIS — H353113 Nonexudative age-related macular degeneration, right eye, advanced atrophic without subfoveal involvement: Secondary | ICD-10-CM | POA: Diagnosis not present

## 2024-04-17 DIAGNOSIS — L720 Epidermal cyst: Secondary | ICD-10-CM | POA: Diagnosis not present

## 2024-04-17 DIAGNOSIS — L821 Other seborrheic keratosis: Secondary | ICD-10-CM | POA: Diagnosis not present

## 2024-04-17 DIAGNOSIS — Z85828 Personal history of other malignant neoplasm of skin: Secondary | ICD-10-CM | POA: Diagnosis not present

## 2024-04-17 DIAGNOSIS — L820 Inflamed seborrheic keratosis: Secondary | ICD-10-CM | POA: Diagnosis not present

## 2024-05-18 DIAGNOSIS — M4712 Other spondylosis with myelopathy, cervical region: Secondary | ICD-10-CM | POA: Diagnosis not present

## 2024-05-18 DIAGNOSIS — I4891 Unspecified atrial fibrillation: Secondary | ICD-10-CM | POA: Diagnosis not present

## 2024-05-18 DIAGNOSIS — I5032 Chronic diastolic (congestive) heart failure: Secondary | ICD-10-CM | POA: Diagnosis not present

## 2024-06-10 ENCOUNTER — Other Ambulatory Visit (HOSPITAL_COMMUNITY): Payer: Self-pay | Admitting: Cardiology

## 2024-06-10 DIAGNOSIS — H353113 Nonexudative age-related macular degeneration, right eye, advanced atrophic without subfoveal involvement: Secondary | ICD-10-CM | POA: Diagnosis not present

## 2024-06-24 ENCOUNTER — Ambulatory Visit (HOSPITAL_COMMUNITY): Payer: Medicare Other | Admitting: Internal Medicine

## 2024-06-25 DIAGNOSIS — L820 Inflamed seborrheic keratosis: Secondary | ICD-10-CM | POA: Diagnosis not present

## 2024-06-30 DIAGNOSIS — H353221 Exudative age-related macular degeneration, left eye, with active choroidal neovascularization: Secondary | ICD-10-CM | POA: Diagnosis not present

## 2024-07-15 DIAGNOSIS — H353113 Nonexudative age-related macular degeneration, right eye, advanced atrophic without subfoveal involvement: Secondary | ICD-10-CM | POA: Diagnosis not present

## 2024-07-19 DIAGNOSIS — I5032 Chronic diastolic (congestive) heart failure: Secondary | ICD-10-CM | POA: Diagnosis not present

## 2024-07-19 DIAGNOSIS — M4712 Other spondylosis with myelopathy, cervical region: Secondary | ICD-10-CM | POA: Diagnosis not present

## 2024-07-19 DIAGNOSIS — I4891 Unspecified atrial fibrillation: Secondary | ICD-10-CM | POA: Diagnosis not present

## 2024-07-31 ENCOUNTER — Other Ambulatory Visit: Payer: Self-pay | Admitting: Cardiology

## 2024-07-31 ENCOUNTER — Other Ambulatory Visit (HOSPITAL_COMMUNITY): Payer: Self-pay | Admitting: Physician Assistant

## 2024-07-31 DIAGNOSIS — I4819 Other persistent atrial fibrillation: Secondary | ICD-10-CM

## 2024-07-31 NOTE — Telephone Encounter (Signed)
 Prescription refill request for Eliquis  received. Indication: a fib Last office visit: 12/20/23 Scr: 0.95 care everywhere 02/07/24 Age: 83 Weight: 68kg

## 2024-08-07 DIAGNOSIS — Z23 Encounter for immunization: Secondary | ICD-10-CM | POA: Diagnosis not present

## 2024-08-12 DIAGNOSIS — Z23 Encounter for immunization: Secondary | ICD-10-CM | POA: Diagnosis not present

## 2024-08-18 DIAGNOSIS — I4891 Unspecified atrial fibrillation: Secondary | ICD-10-CM | POA: Diagnosis not present

## 2024-08-18 DIAGNOSIS — I5032 Chronic diastolic (congestive) heart failure: Secondary | ICD-10-CM | POA: Diagnosis not present

## 2024-08-18 DIAGNOSIS — M4712 Other spondylosis with myelopathy, cervical region: Secondary | ICD-10-CM | POA: Diagnosis not present

## 2024-10-29 ENCOUNTER — Other Ambulatory Visit: Payer: Self-pay | Admitting: Cardiology

## 2024-10-29 DIAGNOSIS — I4819 Other persistent atrial fibrillation: Secondary | ICD-10-CM

## 2024-10-29 NOTE — Telephone Encounter (Signed)
 Prescription refill request for Eliquis  received. Indication:afib  Last office visit: [place] 12/20/2023 Scr:  0.95 02/13/2024 Age: 83 yo  Weight: 68.9 kg   Refill sent.

## 2024-11-05 ENCOUNTER — Other Ambulatory Visit (HOSPITAL_BASED_OUTPATIENT_CLINIC_OR_DEPARTMENT_OTHER): Payer: Self-pay | Admitting: Cardiology

## 2024-11-17 ENCOUNTER — Other Ambulatory Visit: Payer: Medicare Other

## 2024-11-17 ENCOUNTER — Ambulatory Visit: Payer: Medicare Other

## 2024-12-07 ENCOUNTER — Encounter (HOSPITAL_BASED_OUTPATIENT_CLINIC_OR_DEPARTMENT_OTHER): Payer: Self-pay | Admitting: Radiology

## 2024-12-07 ENCOUNTER — Ambulatory Visit (HOSPITAL_BASED_OUTPATIENT_CLINIC_OR_DEPARTMENT_OTHER)
Admission: RE | Admit: 2024-12-07 | Discharge: 2024-12-07 | Disposition: A | Source: Ambulatory Visit | Attending: Family Medicine | Admitting: Family Medicine

## 2024-12-07 DIAGNOSIS — Z1231 Encounter for screening mammogram for malignant neoplasm of breast: Secondary | ICD-10-CM | POA: Diagnosis present

## 2024-12-09 ENCOUNTER — Other Ambulatory Visit (HOSPITAL_BASED_OUTPATIENT_CLINIC_OR_DEPARTMENT_OTHER): Payer: Self-pay | Admitting: Cardiology

## 2024-12-11 NOTE — Telephone Encounter (Signed)
 PATIENT NEED MAKE AN APPOINTMENT FOR FURTHER REFILLS. THANK

## 2024-12-22 ENCOUNTER — Ambulatory Visit (HOSPITAL_BASED_OUTPATIENT_CLINIC_OR_DEPARTMENT_OTHER): Admitting: Radiology

## 2024-12-22 ENCOUNTER — Other Ambulatory Visit (HOSPITAL_BASED_OUTPATIENT_CLINIC_OR_DEPARTMENT_OTHER)
